# Patient Record
Sex: Female | Born: 1965 | Race: White | Hispanic: No | Marital: Married | Smoking: Former smoker
Health system: Southern US, Community
[De-identification: ages and names within clinical notes are randomized; demographics above are authoritative.]

## PROBLEM LIST (undated history)

## (undated) DIAGNOSIS — F329 Major depressive disorder, single episode, unspecified: Secondary | ICD-10-CM

## (undated) DIAGNOSIS — T7840XA Allergy, unspecified, initial encounter: Secondary | ICD-10-CM

## (undated) DIAGNOSIS — K219 Gastro-esophageal reflux disease without esophagitis: Secondary | ICD-10-CM

## (undated) DIAGNOSIS — R51 Headache: Secondary | ICD-10-CM

## (undated) DIAGNOSIS — F32A Depression, unspecified: Secondary | ICD-10-CM

## (undated) DIAGNOSIS — I10 Essential (primary) hypertension: Secondary | ICD-10-CM

## (undated) DIAGNOSIS — R7303 Prediabetes: Secondary | ICD-10-CM

## (undated) DIAGNOSIS — G4730 Sleep apnea, unspecified: Secondary | ICD-10-CM

## (undated) DIAGNOSIS — M1990 Unspecified osteoarthritis, unspecified site: Secondary | ICD-10-CM

## (undated) DIAGNOSIS — F419 Anxiety disorder, unspecified: Secondary | ICD-10-CM

## (undated) DIAGNOSIS — J45909 Unspecified asthma, uncomplicated: Secondary | ICD-10-CM

## (undated) DIAGNOSIS — R519 Headache, unspecified: Secondary | ICD-10-CM

## (undated) DIAGNOSIS — G709 Myoneural disorder, unspecified: Secondary | ICD-10-CM

## (undated) HISTORY — DX: Depression, unspecified: F32.A

## (undated) HISTORY — DX: Allergy, unspecified, initial encounter: T78.40XA

## (undated) HISTORY — DX: Sleep apnea, unspecified: G47.30

## (undated) HISTORY — DX: Major depressive disorder, single episode, unspecified: F32.9

## (undated) HISTORY — DX: Unspecified asthma, uncomplicated: J45.909

## (undated) HISTORY — DX: Myoneural disorder, unspecified: G70.9

## (undated) HISTORY — DX: Gastro-esophageal reflux disease without esophagitis: K21.9

---
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
## 2005-02-01 ENCOUNTER — Ambulatory Visit: Payer: Self-pay

## 2005-02-01 IMAGING — US US PELV - US TRANSVAGINAL
1 series · 17 of 25 positions shown · non-contrast
Comparison: none

REASON FOR EXAM: Follow-up fibroids
COMMENTS:

[Series 1: us pelv - us transvaginal · 17 of 43 slices shown]
[im 1/43]
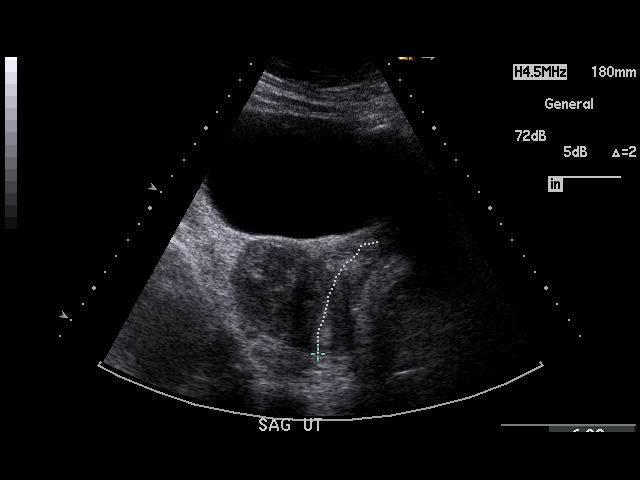
[im 4/43]
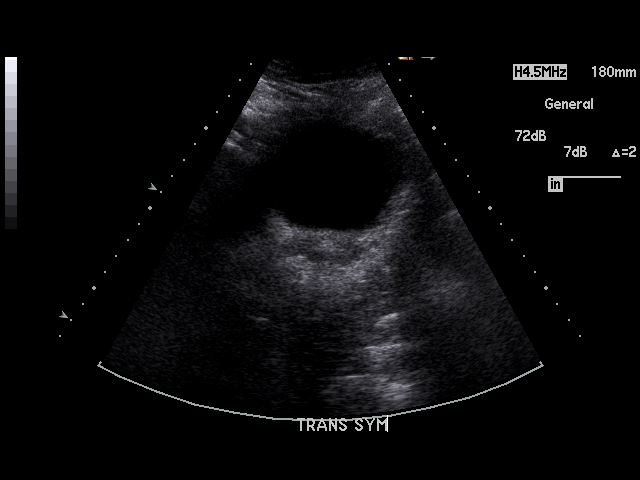
[im 6/43]
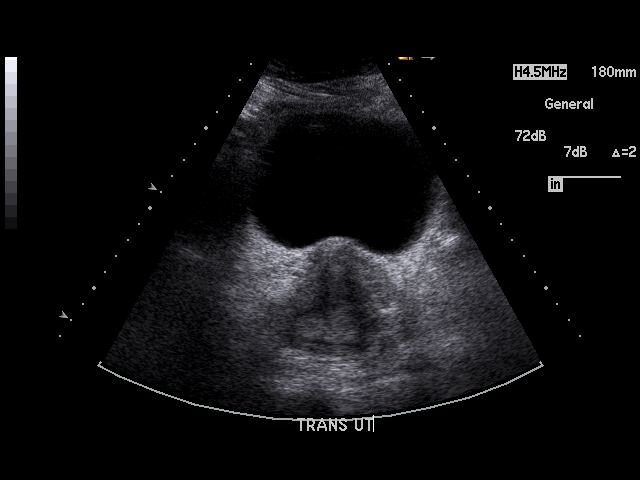
[im 9/43]
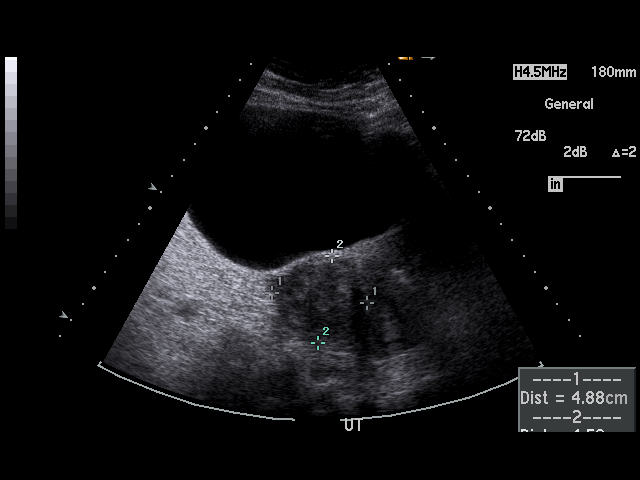
[im 11/43]
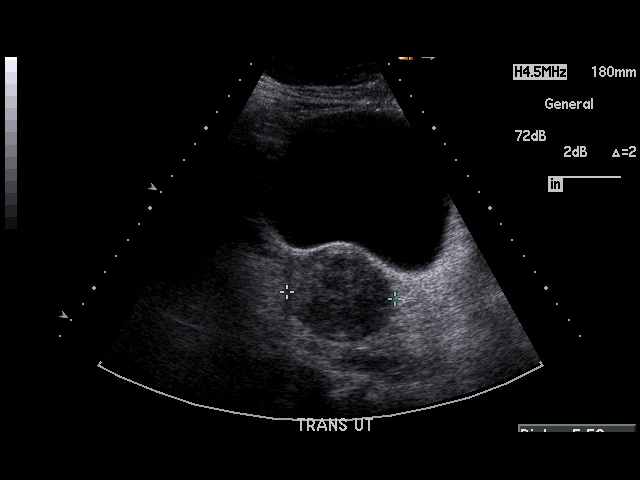
[im 15/43]
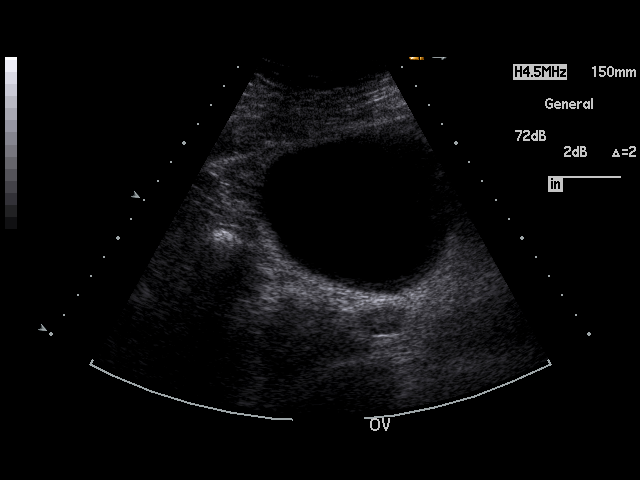
[im 16/43]
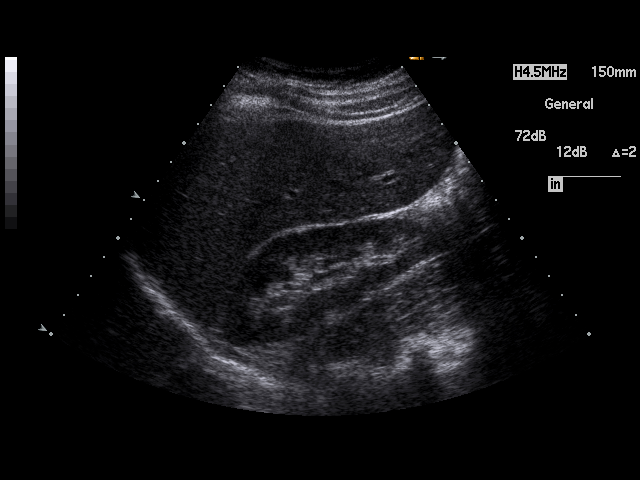
[im 20/43]
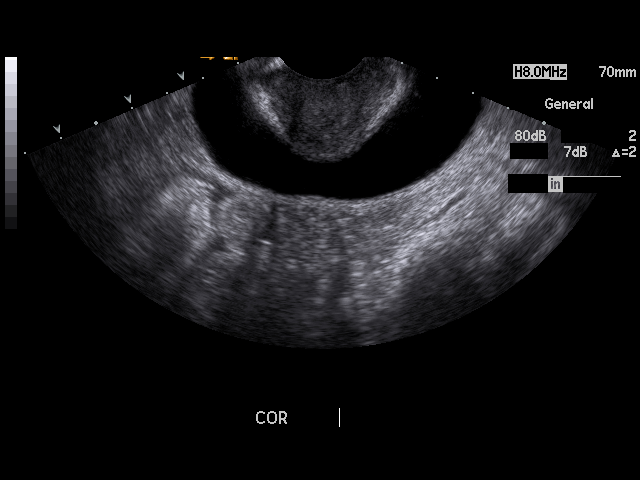
[im 22/43]
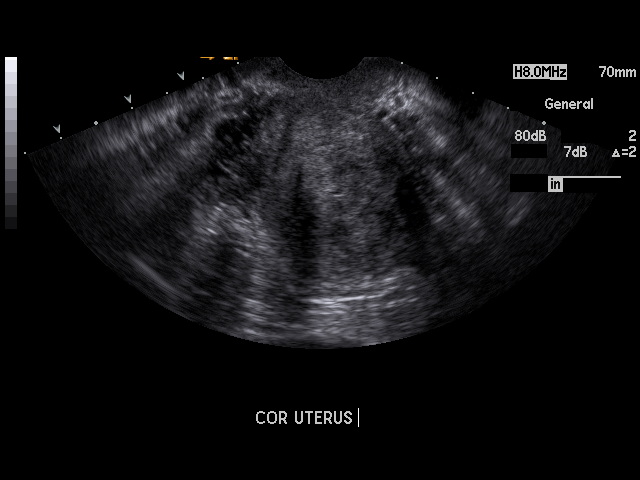
[im 23/43]
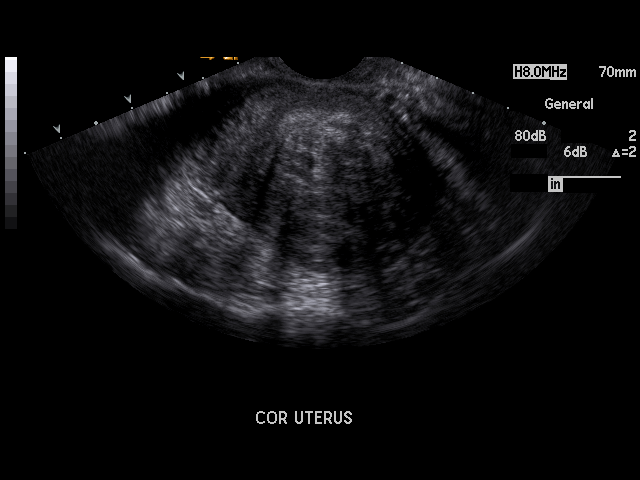
[im 27/43]
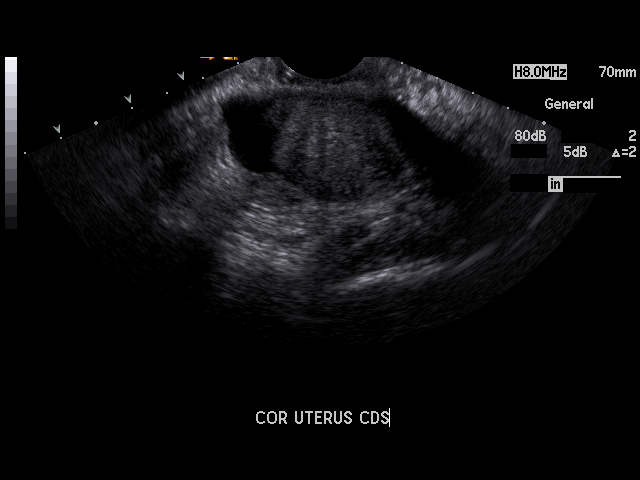
[im 29/43]
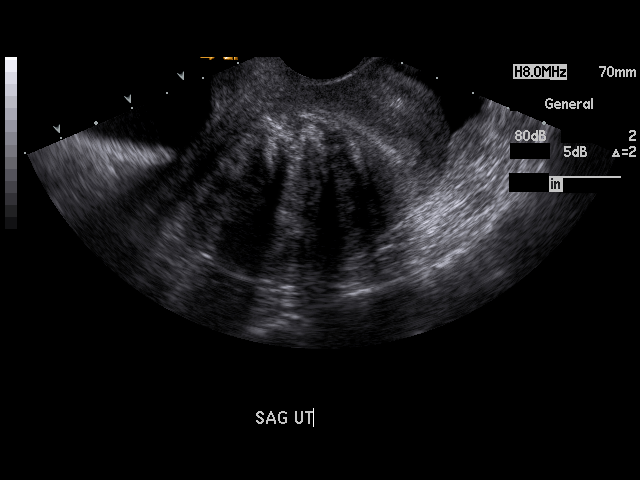
[im 32/43]
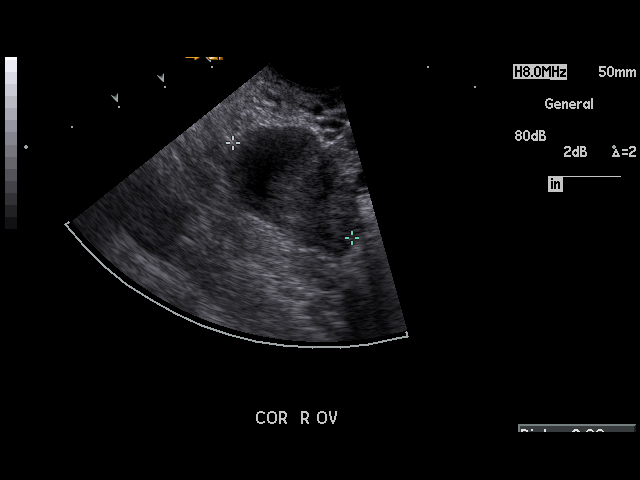
[im 34/43]
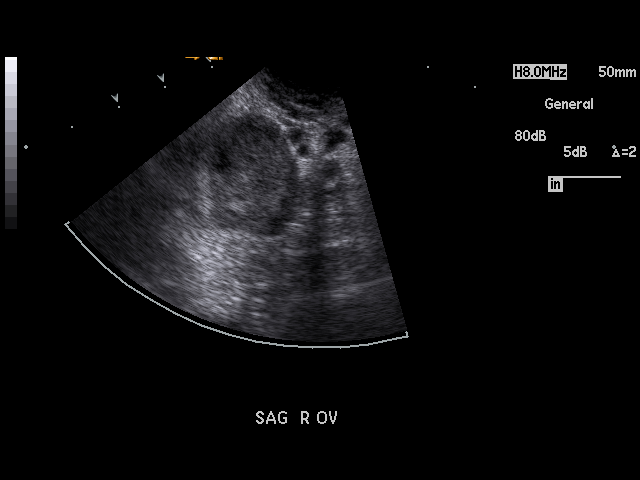
[im 37/43]
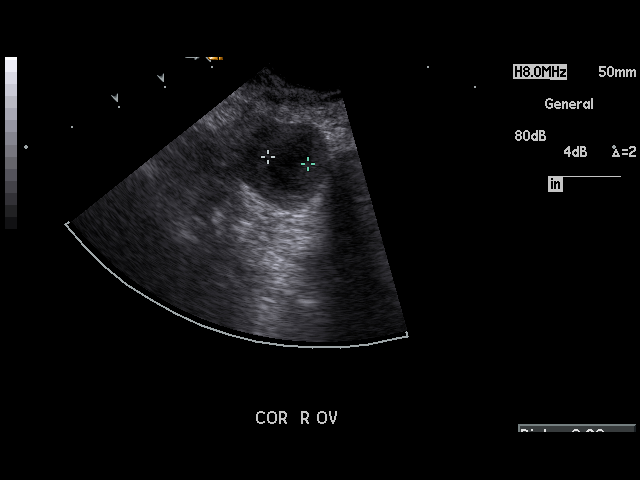
[im 39/43]
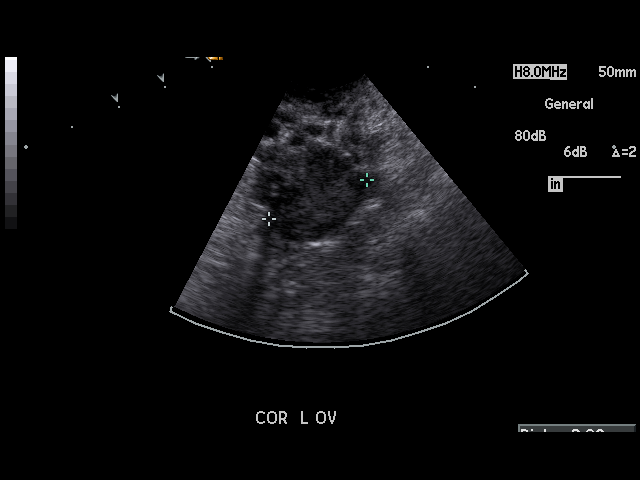
[im 43/43]
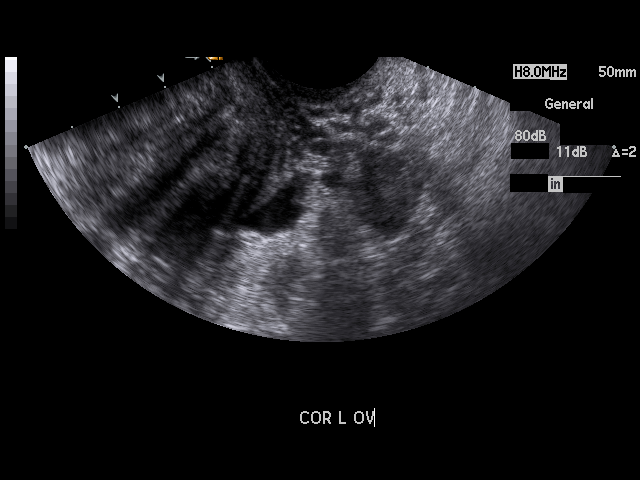

[17 of 25 positions shown; findings below may reference images not displayed]

PROCEDURE:     US  - US PELVIS MASS EXAM  - [DATE] [DATE] [DATE]  [DATE]

RESULT:     The uterus measures 6.99 cm x 6.92 cm x 5.36 cm.  Within the
uterus there is noted a hypoechoic mass which measures 5.44 cm x 4.41 cm x
4.78 cm and is consistent with the uterine fibroid.  No other uterine masses
are seen.  The endometrium measures 1.01 cm in thickness.  The ovaries are
visualized bilaterally.  The RIGHT ovary measures 2.42 cm at maximum
diameter and the LEFT ovary measures 2.08 cm at maximum diameter.  There is
a 1.12 cm cyst of the RIGHT ovary.  There is some echogenicity within the
ovary which suggest this may represent a small hemorrhagic cyst.  A small
amount of free fluid is seen in the pelvis.
IMPRESSION: There is a 5.44 cm uterine mass consistent with a fibroid as noted above.

Trace ascites.

There is a small cyst of the RIGHT ovary. The cyst contains echoes and may
represent a small hemorrhagic cyst.

## 2005-06-06 ENCOUNTER — Ambulatory Visit: Payer: Self-pay | Admitting: Pain Medicine

## 2005-06-06 IMAGING — CR DG LUMBAR SPINE AP/LAT/OBLIQUES W/ FLEX AND EXT
1 series · 5 of 5 positions shown · non-contrast
Comparison: none

REASON FOR EXAM: back and neck pain (telephone results to physician)
COMMENTS:

PROCEDURE:     DXR - DXR LUMBAR SPINE WITH OBLIQUES  - [DATE] [DATE]
RESULT:     AP, lateral, and oblique views of the lumbar spine show the
vertebral body heights and intervertebral disc spaces to be well maintained.
 The vertebral body alignment is normal.  The pedicles are bilaterally
intact.

[Series 1: view not recorded · 0.17mm/px · 5 of 5 slices shown]
[im 1/5]
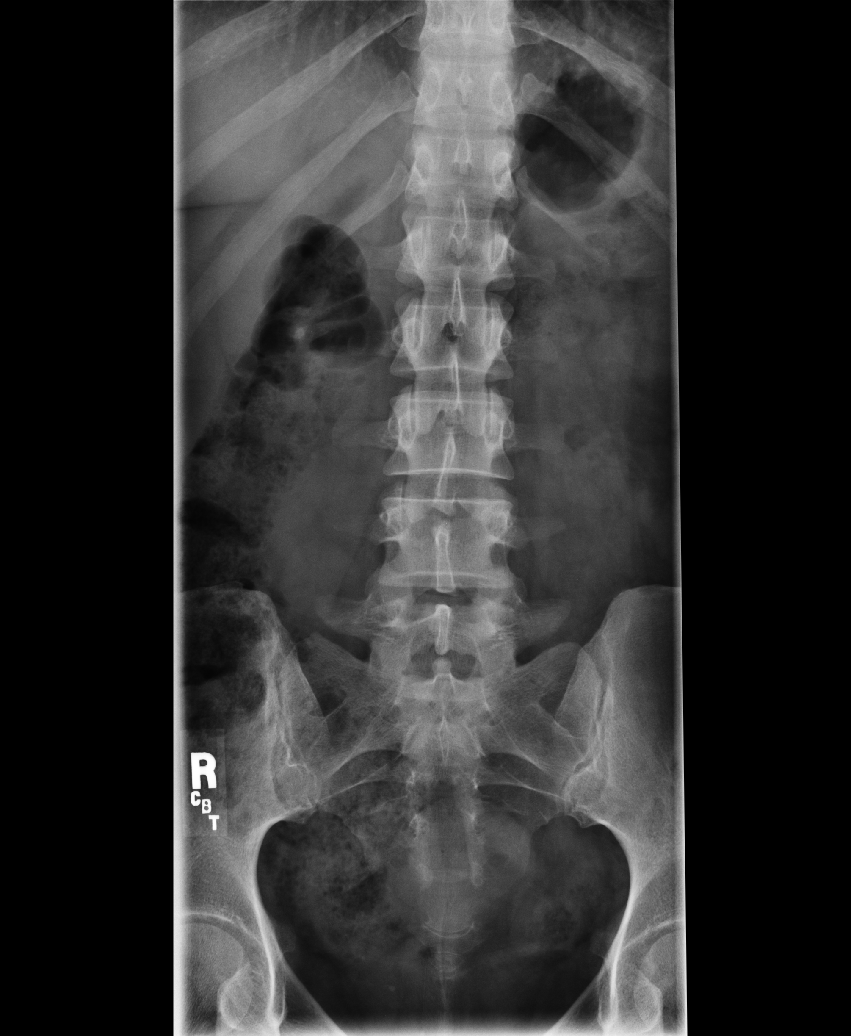
[im 2/5]
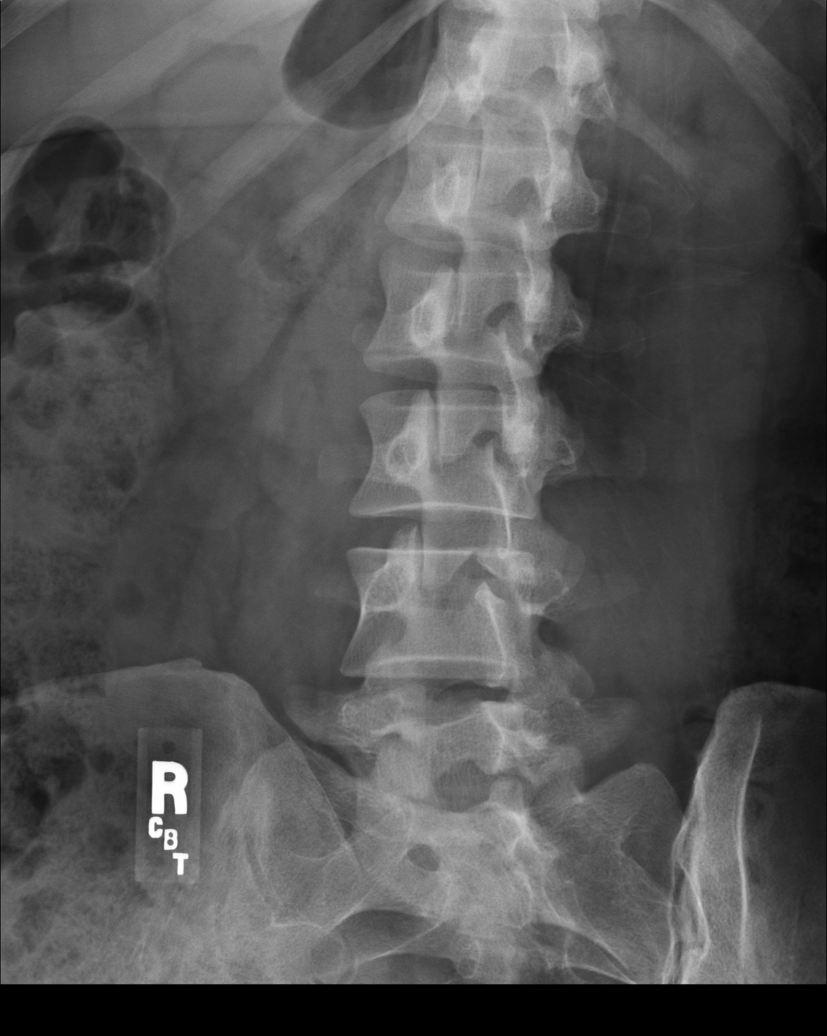
[im 3/5]
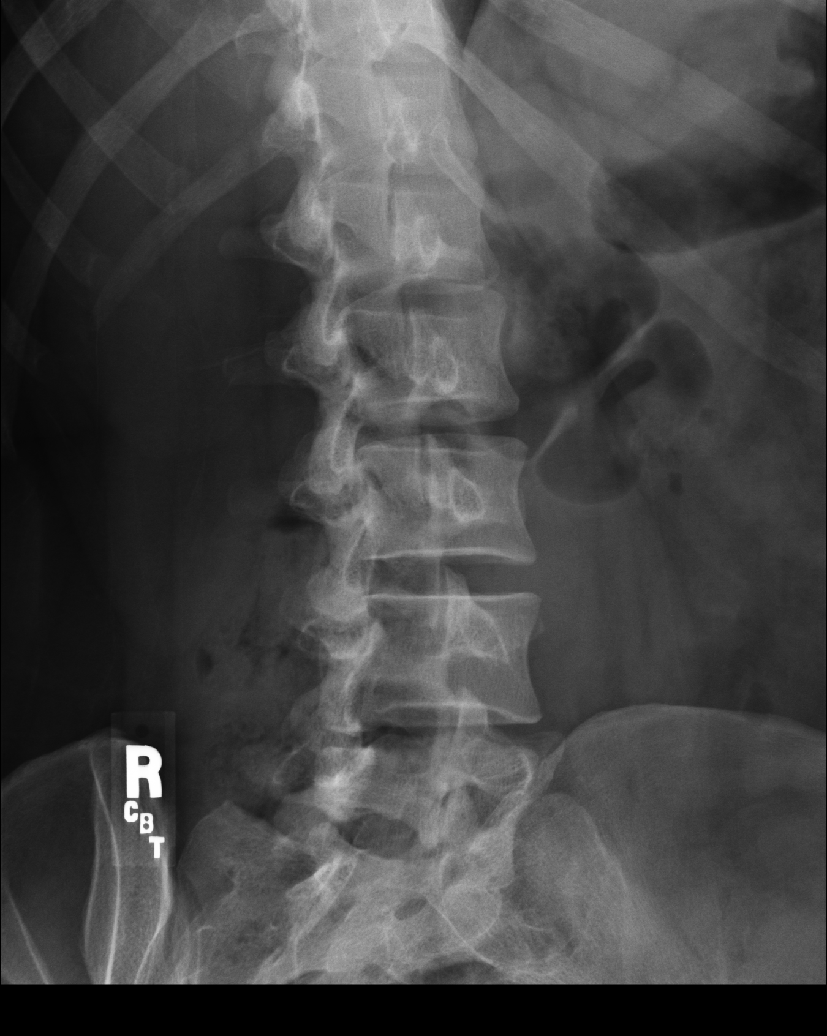
[im 4/5]
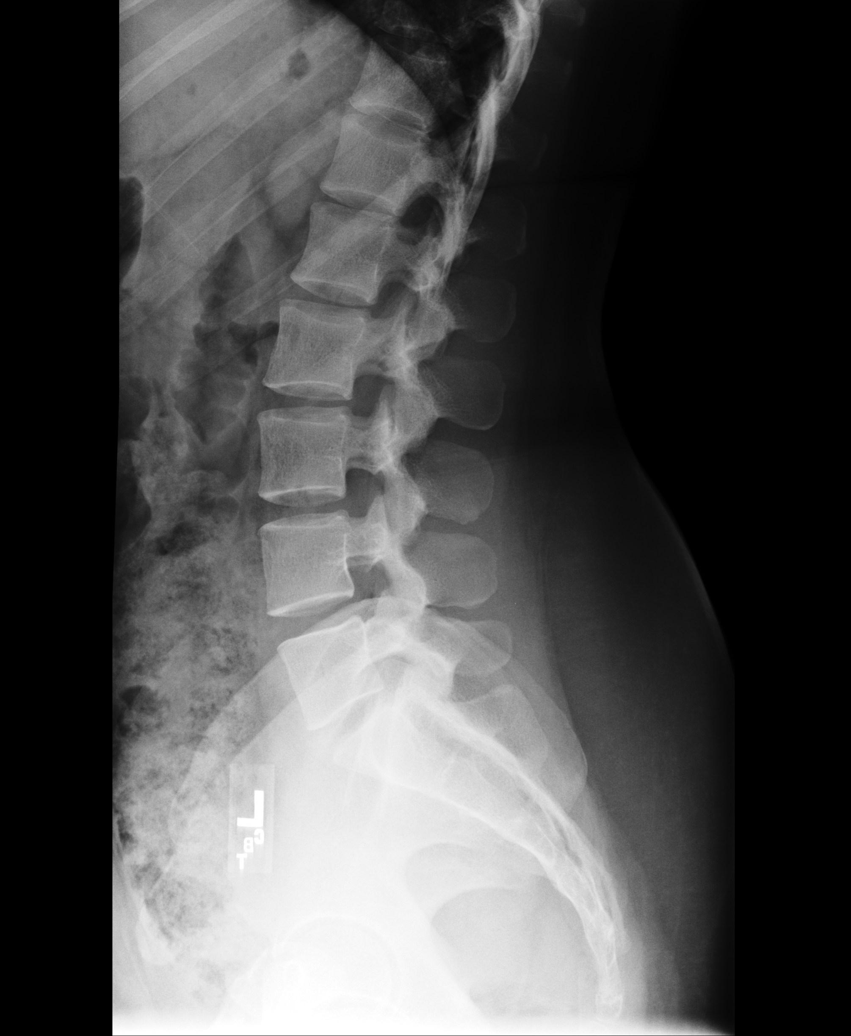
[im 5/5]
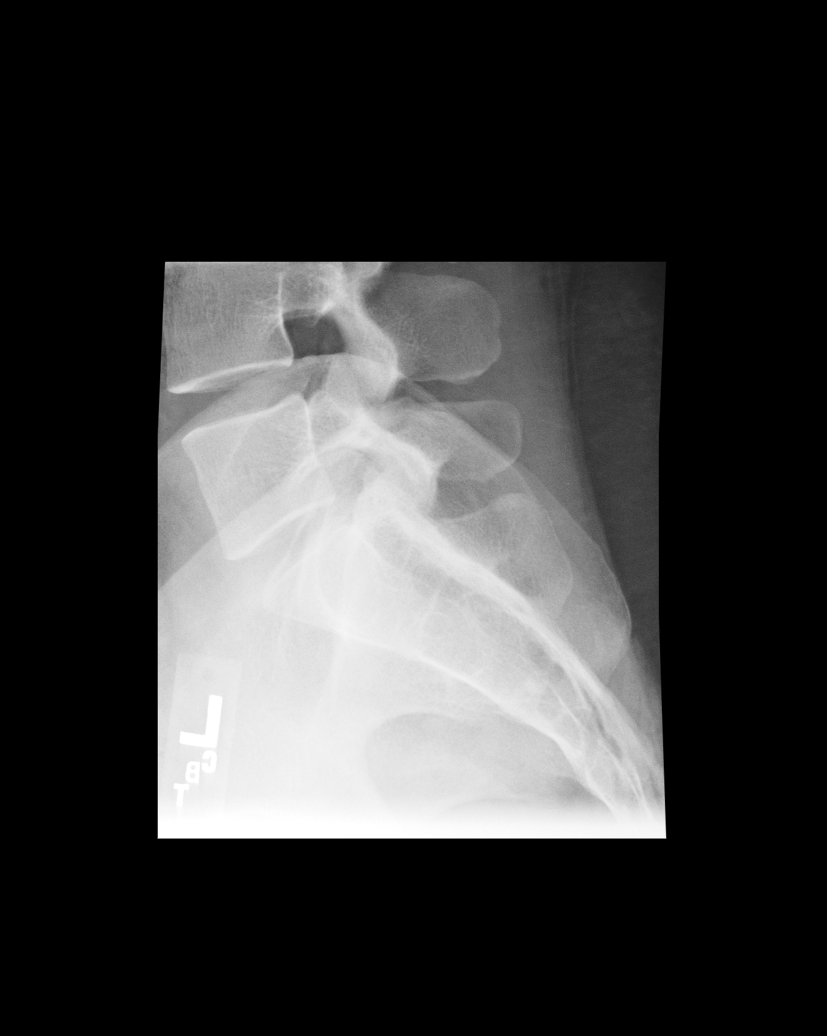

[5 of 5 positions shown; findings below may reference images not displayed]

IMPRESSION: No significant abnormalities are noted.

## 2005-06-06 IMAGING — CR CERVICAL SPINE - COMPLETE 4+ VIEW
1 series · 6 of 6 positions shown · non-contrast
Comparison: none

REASON FOR EXAM: back and neck pain (telephone results to physician)
COMMENTS:

[Series 1: view not recorded · 0.17mm/px · 6 of 6 slices shown]
[im 1/6]
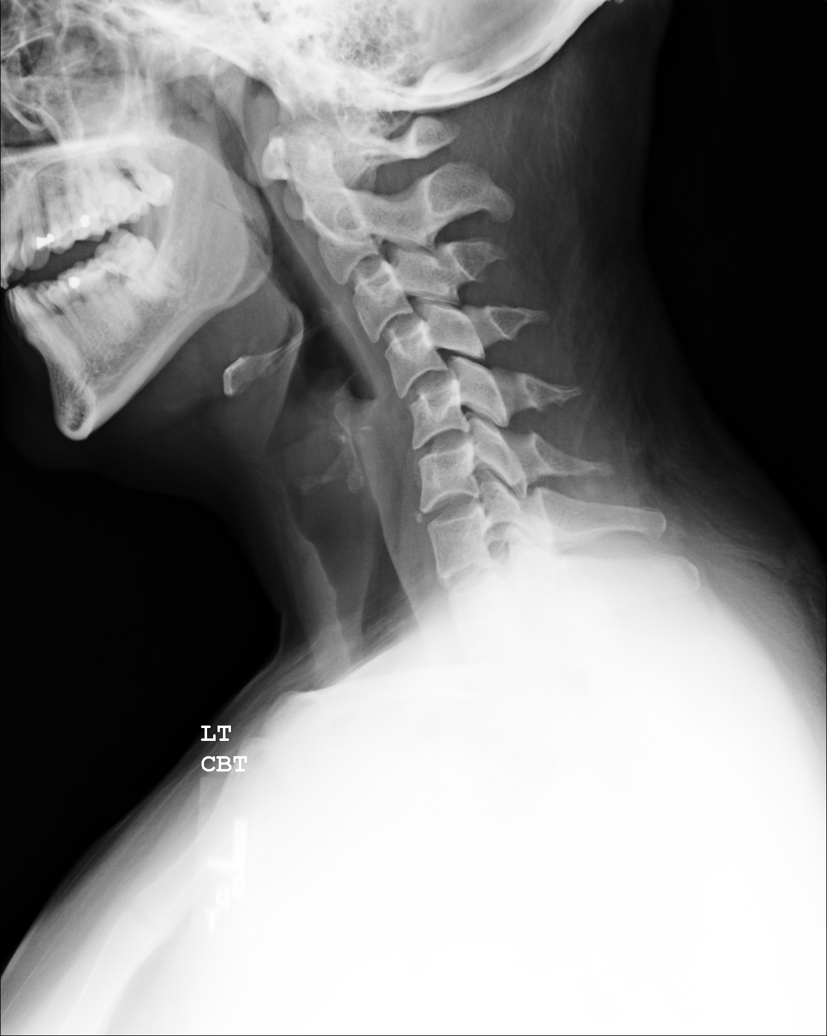
[im 2/6]
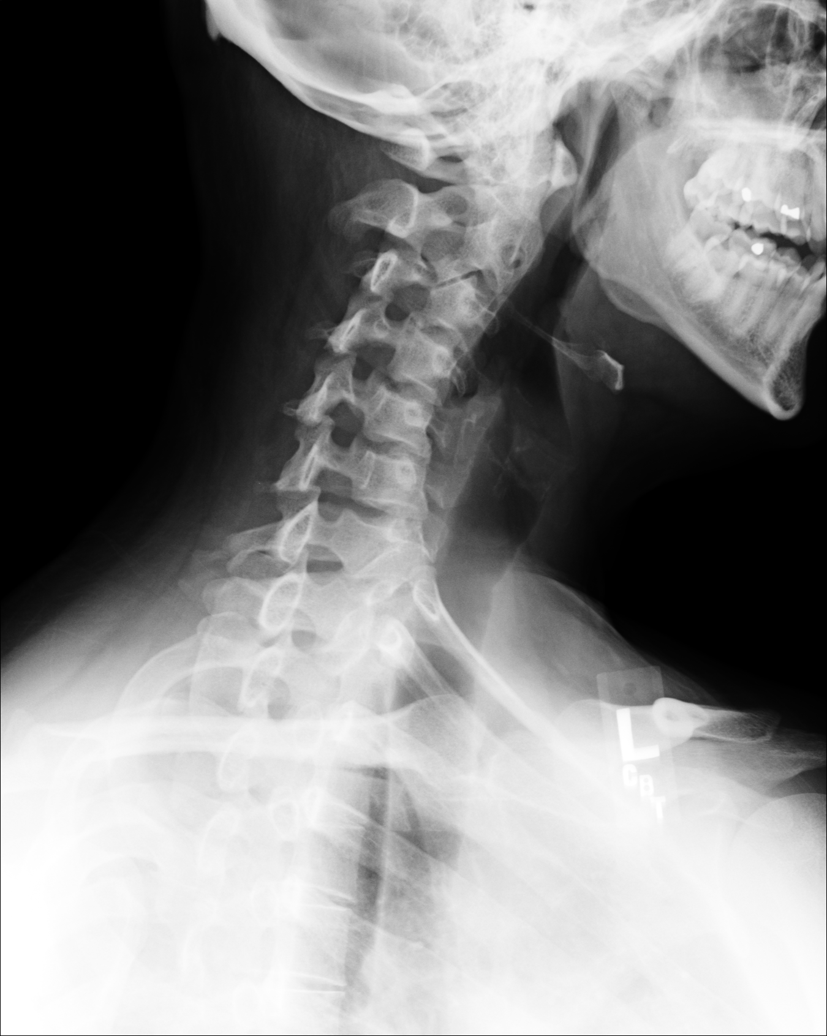
[im 3/6]
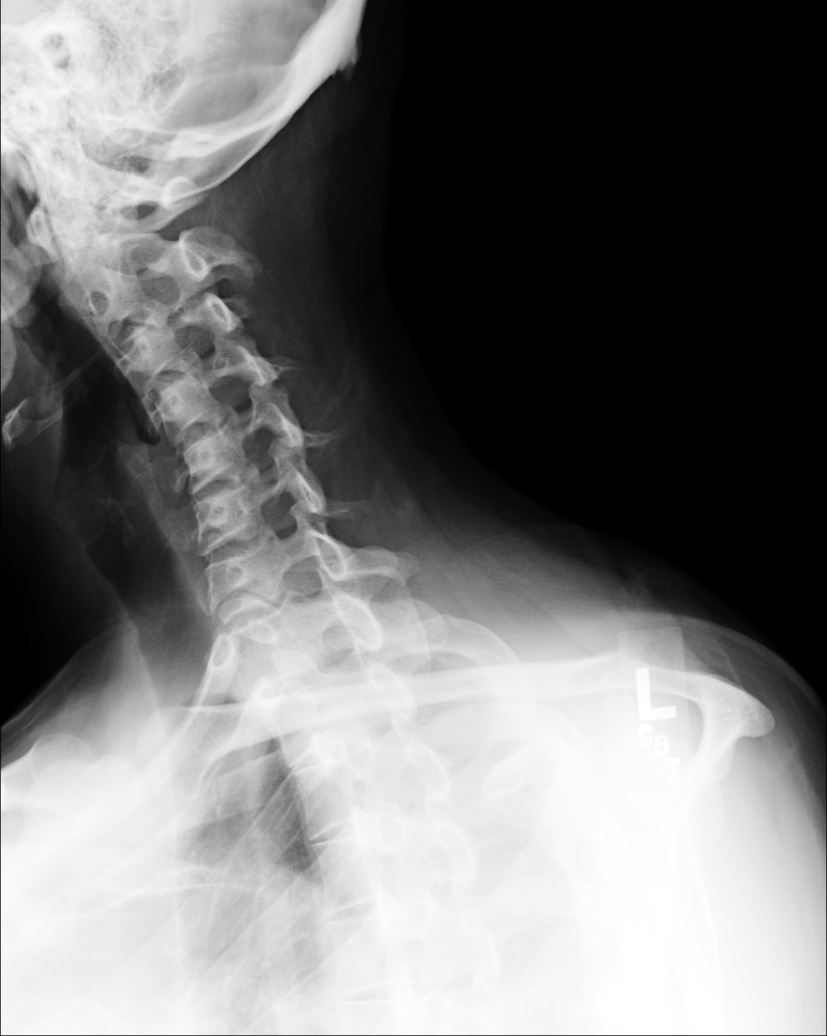
[im 4/6]
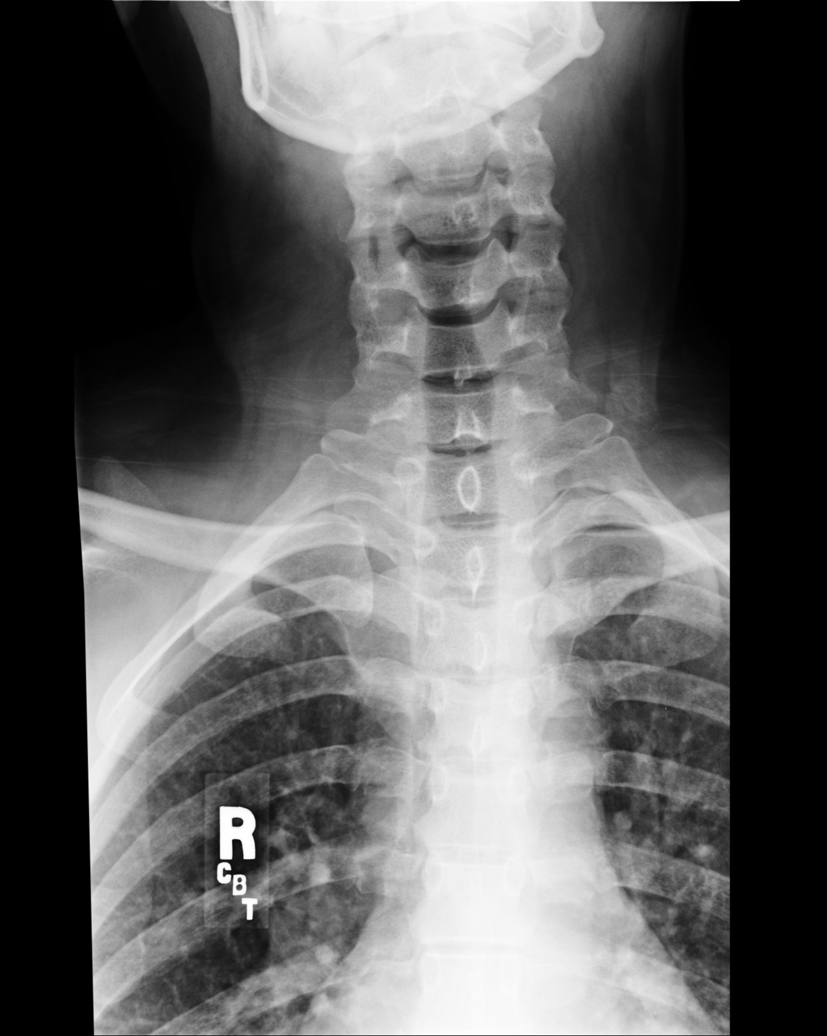
[im 5/6]
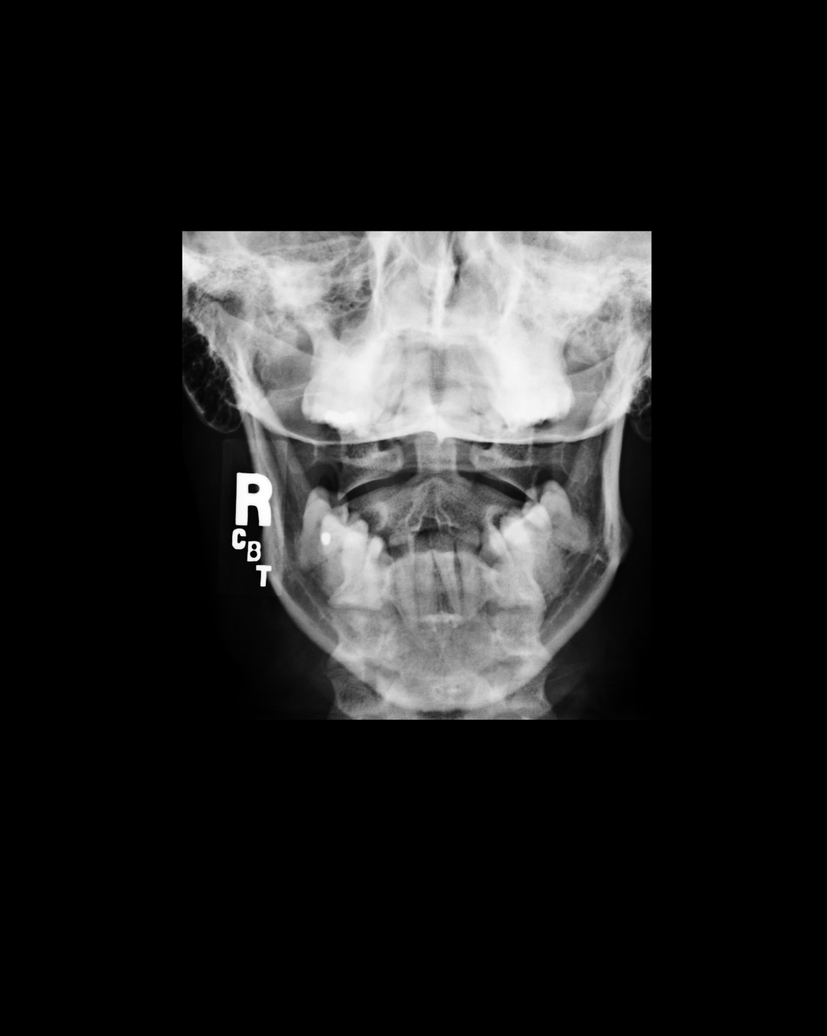
[im 6/6]
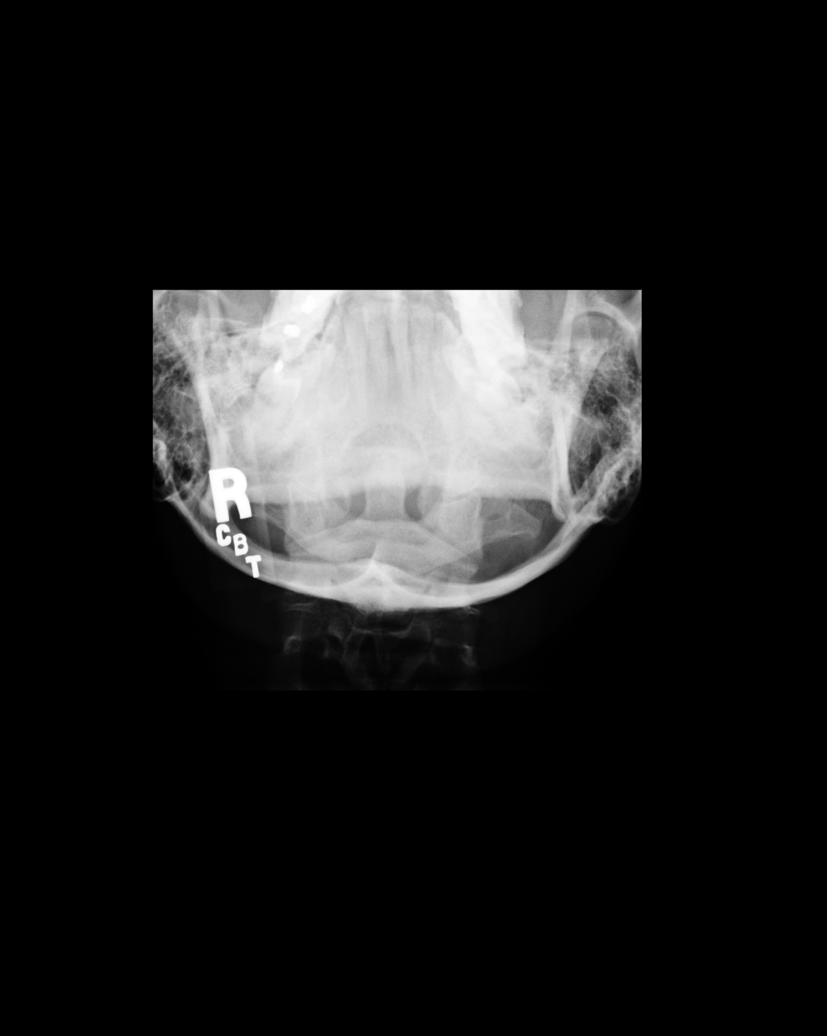

[6 of 6 positions shown; findings below may reference images not displayed]

PROCEDURE:     DXR - DXR CERVICAL SPINE COMPLETE  - [DATE] [DATE]

RESULT:     The vertebral body heights and intervertebral disc spaces are
well maintained.  Vertebral body alignment is normal.  There is noted
reversal of the usual lordotic curvature which suggests cervical muscle
spasm.  Oblique views show the neural foramen to be widely patent
bilaterally.  The articular facets are normal in appearance.  The odontoid
process is intact.
IMPRESSION: 1.     No fracture is seen.
2.     No cervical rib formation is observed.
3.     There is reversal of the usual lordotic curvature which suggests
cervical muscle spasm.

## 2006-08-11 DIAGNOSIS — G4700 Insomnia, unspecified: Secondary | ICD-10-CM | POA: Insufficient documentation

## 2006-09-05 DIAGNOSIS — F419 Anxiety disorder, unspecified: Secondary | ICD-10-CM | POA: Insufficient documentation

## 2010-08-19 ENCOUNTER — Ambulatory Visit: Payer: Self-pay | Admitting: Family Medicine

## 2010-08-19 IMAGING — MG MM CAD DIAGNOSTIC MAMMO
1 series · 8 of 8 positions shown · non-contrast
Comparison: none

REASON FOR EXAM: R br pain under nipple    baseline
COMMENTS:

[R CC · right · 8 of 10 slices shown]
[im 1/10]
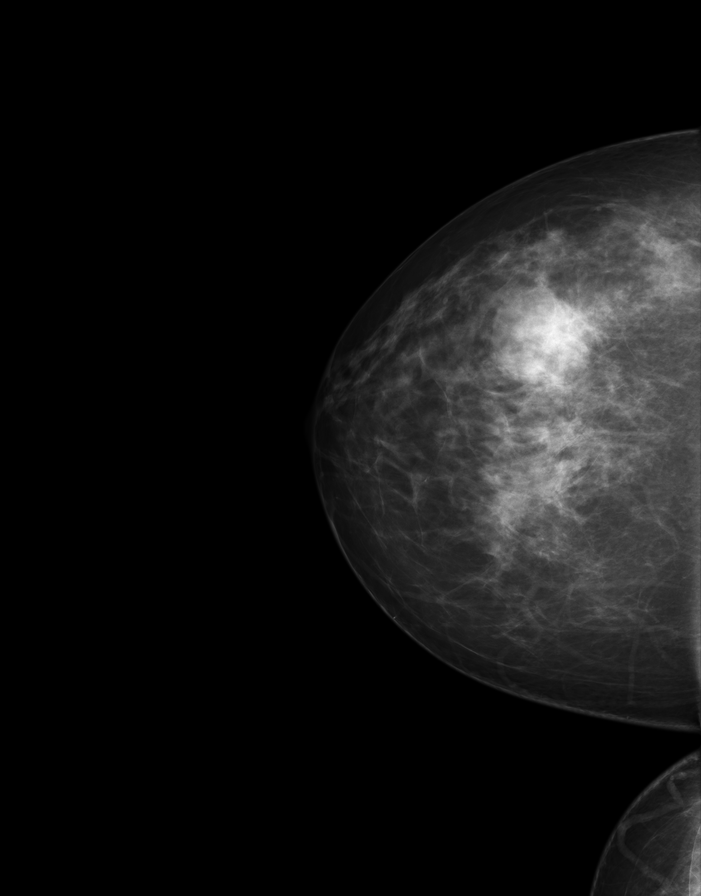
[im 2/10]
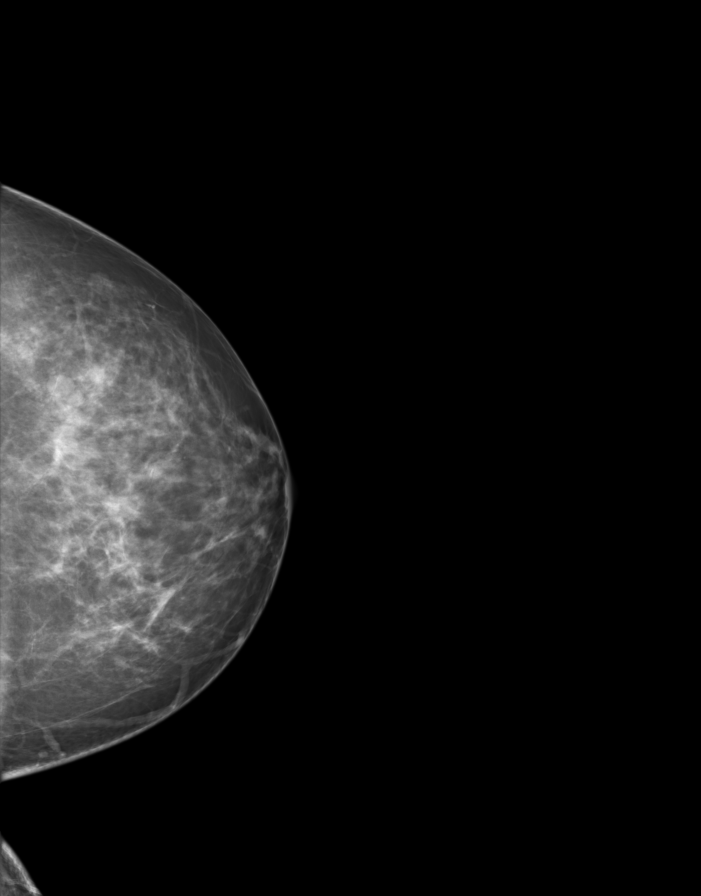
[im 3/10]
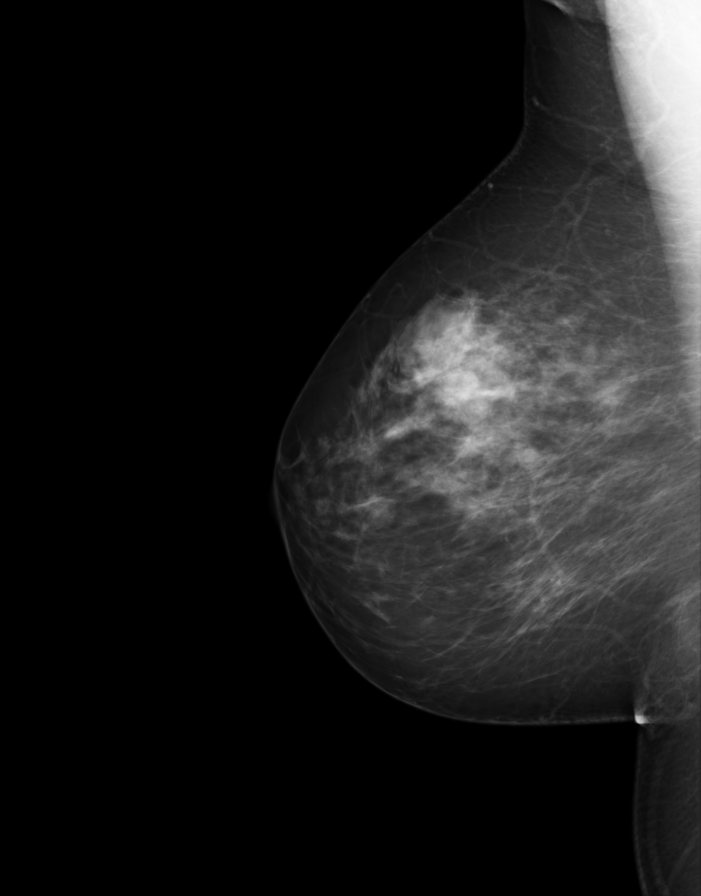
[im 4/10]
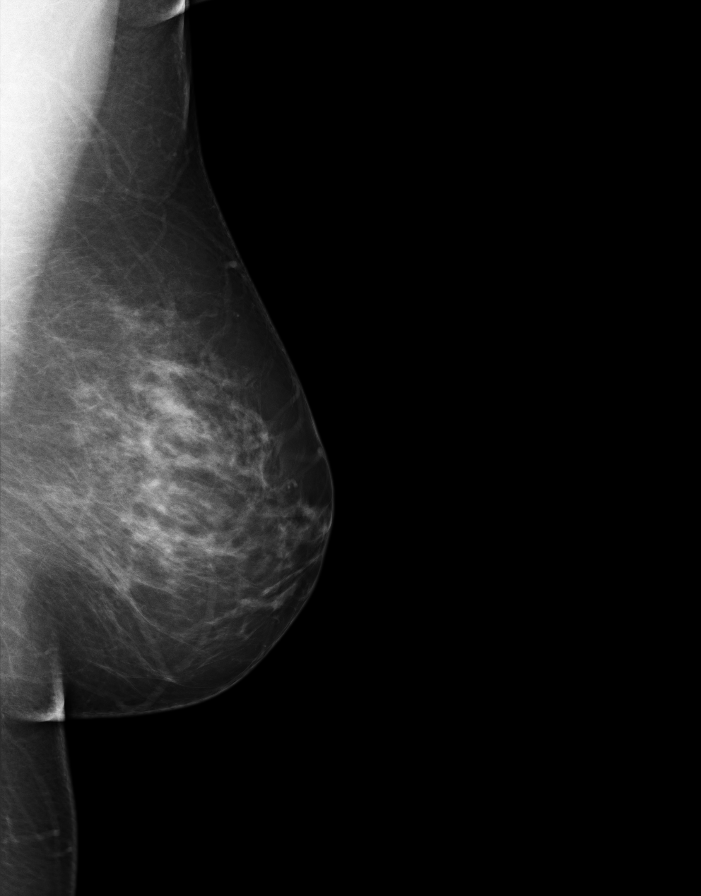
[im 6/10]
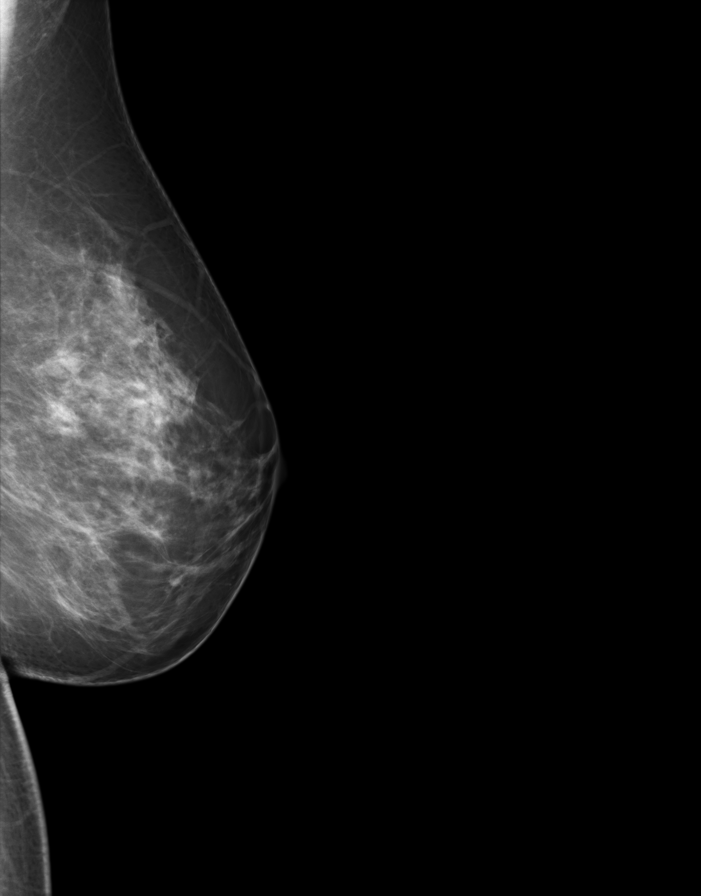
[im 7/10]
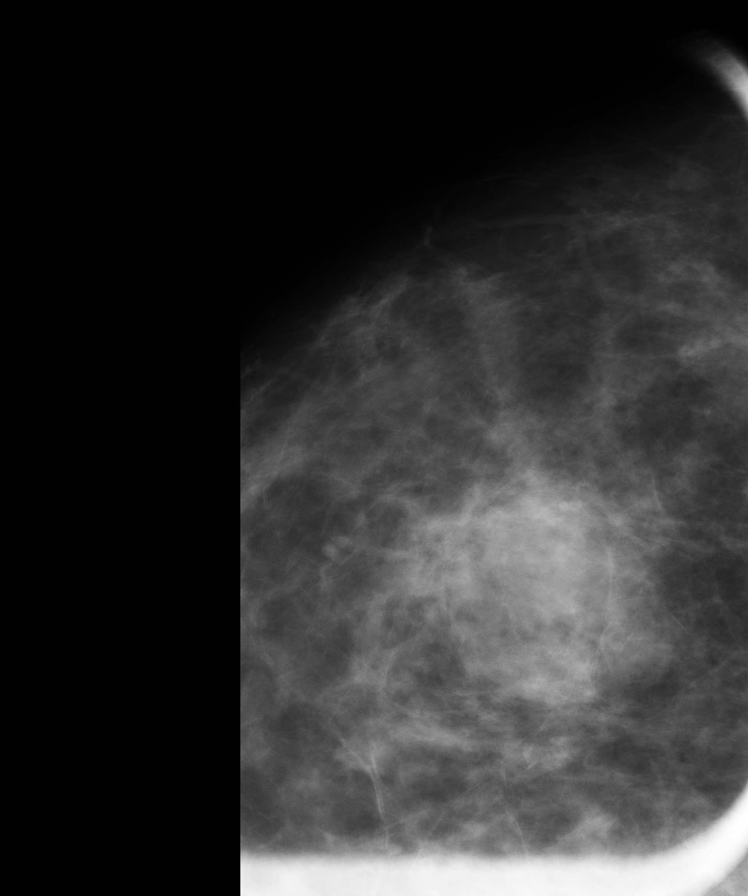
[im 8/10]
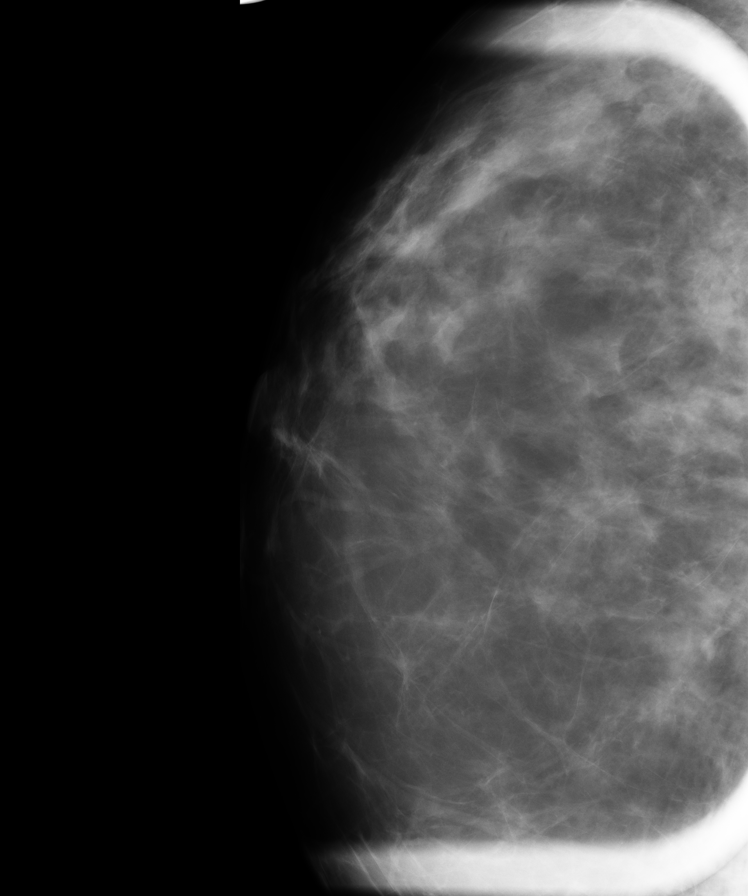
[im 10/10]
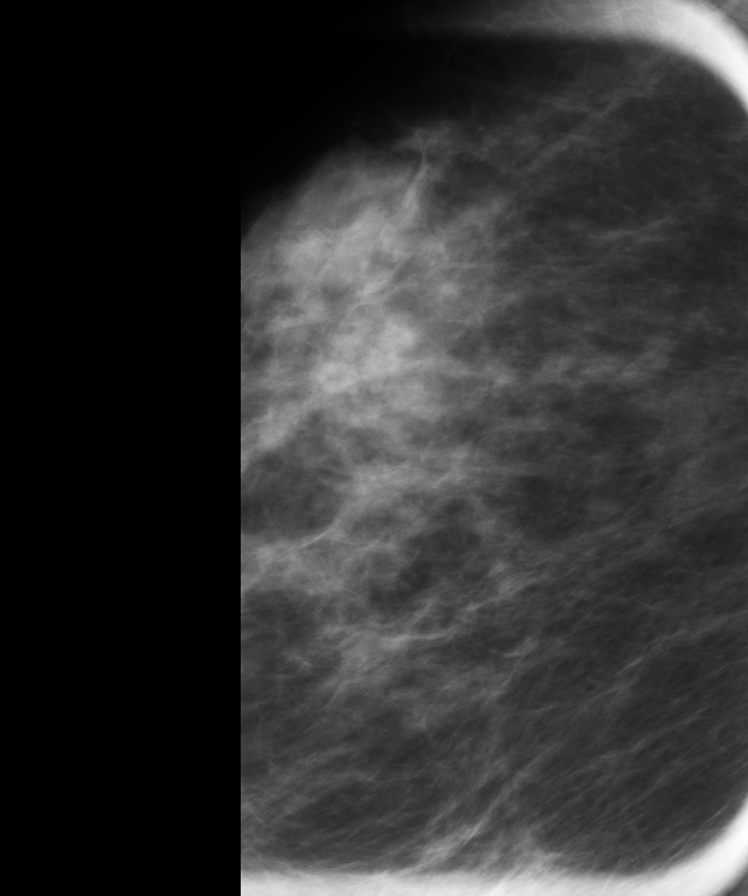

[8 of 8 positions shown; findings below may reference images not displayed]

PROCEDURE:     MAM - MAM DGTL DIAGNOSTIC MAMMO W/CAD  - [DATE]  [DATE]

RESULT:     The patient is reporting discomfort deep to the right nipple.
This is a baseline study.

The breasts exhibit a moderately dense parenchymal pattern. The right breast
is larger than the left.  Somewhat more dense parenchymal tissue is present
on the right in the upper outer quadrant. Spot compression views reveal no
discrete abnormal mass here.  There are no malignant appearing groupings of
microcalcification. I do not see retroareolar abnormalities. Ultrasound was
performed from the 4 o'clock and 7 o'clock positions which were the areas of
symptoms. No discrete cystic or solid masses were demonstrated. At the 9
o'clock position there is an approximately 5 mm diameter cystic structure
demonstrated.  At the 11 o'clock position an area of decreased echogenicity
without significant distal shadowing is seen. It measures approximately
x 2.9 x 1.2 cm.  This likely corresponds to the area of parenchymal density
seen mammographically.
IMPRESSION: 1.I see no abnormality of the left breast.

2.The right breast is larger than the left and it is somewhat more dense in
the upper outer quadrant.  This area corresponds to a reasonably well
circumscribed, complex appearing hypoechoic focus without distal shadowing
at the 10 o'clock to 11 o'clock position.  This likely reflects an entity
such as a fibroadenoma but without any prior studies this cannot be stated
with confidence. I feel it would be prudent for the patient to be considered
for surgical consultation with an eye toward possible biopsy versus close
clinical imaging follow-up.

BI-RADS: Category 4 -A Suspicious Abnormality

The finding is felt to have a low suspicion for malignancy and surgical
consultation is recommended.

A NEGATIVE MAMMOGRAM REPORT DOES NOT PRECLUDE BIOPSY OR OTHER EVALUATION OF
A CLINICALLY PALPABLE OR OTHERWISE SUSPICIOUS MASS OR LESION. BREAST CANCER
MAY NOT BE DETECTED BY MAMMOGRAPHY IN UP TO 10% OF CASES.

## 2010-08-19 IMAGING — US ULTRASOUND RIGHT BREAST
1 series · 17 of 25 positions shown · non-contrast
Comparison: none

REASON FOR EXAM: R br pain under nipple    baseline
COMMENTS:

[Series 1: ultrasound right breast · 17 of 58 slices shown]
[im 1/58]
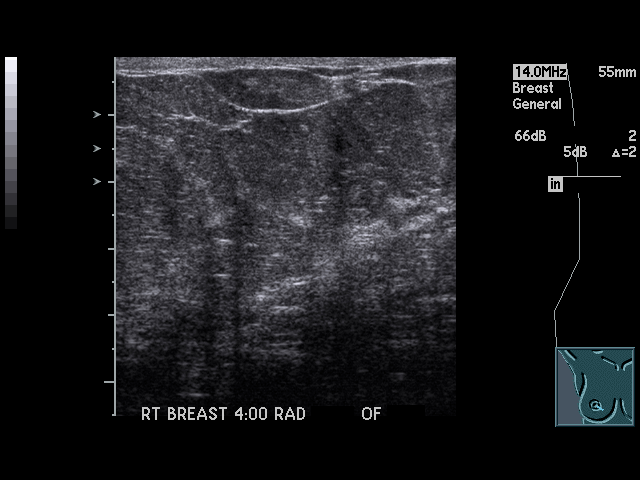
[im 5/58]
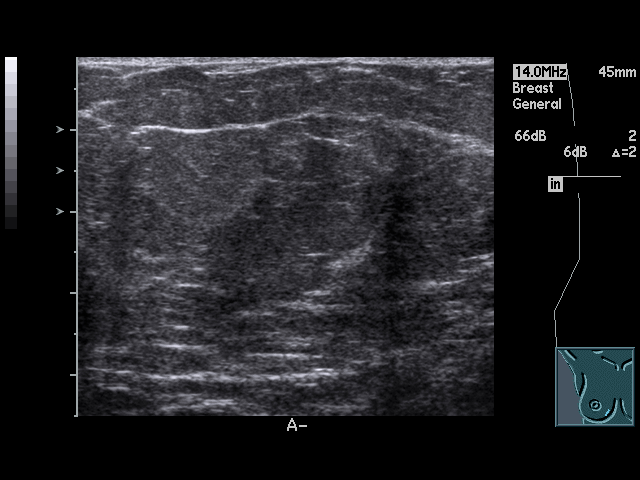
[im 8/58]
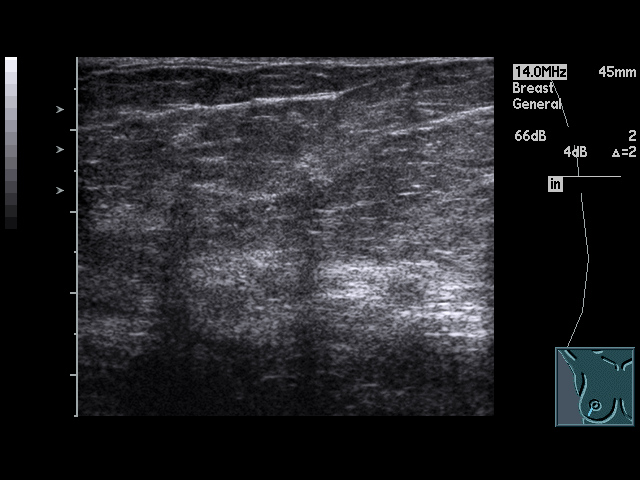
[im 12/58]
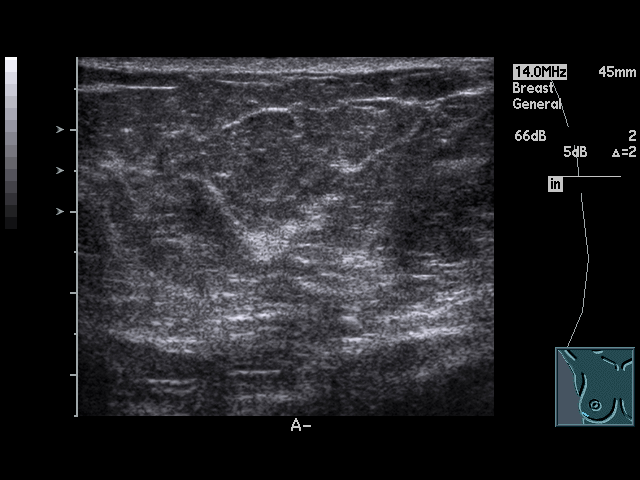
[im 15/58]
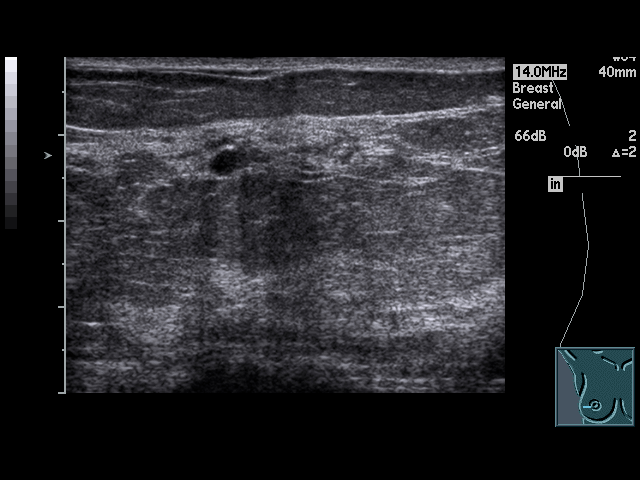
[im 20/58]
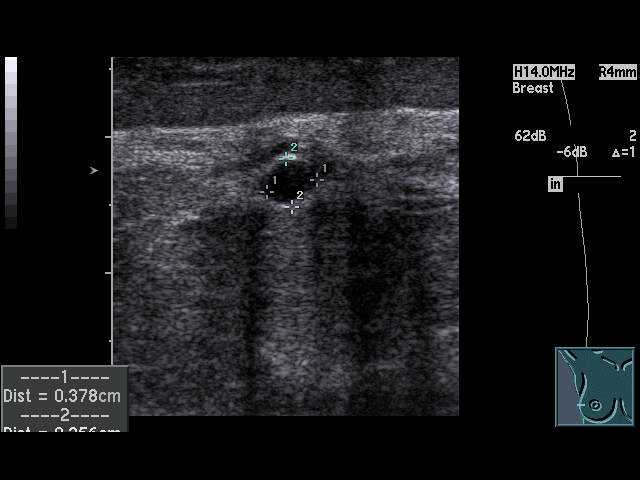
[im 22/58]
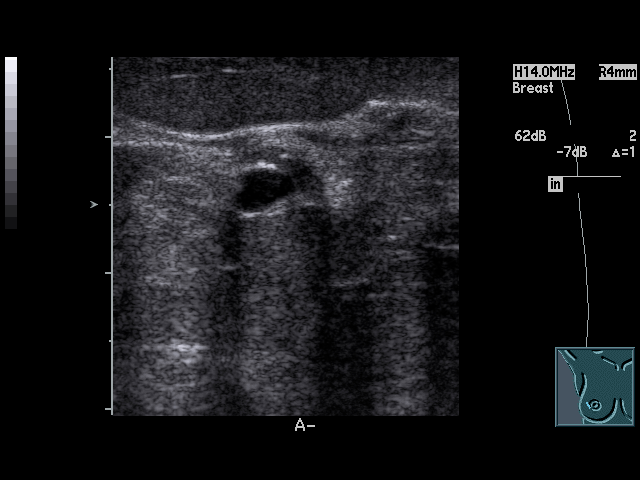
[im 27/58]
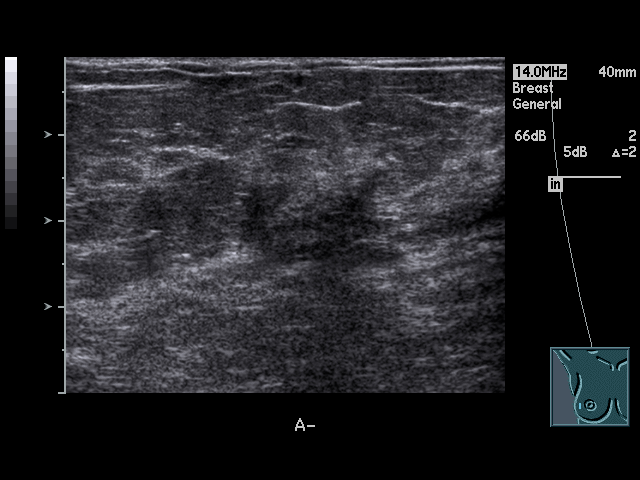
[im 29/58]
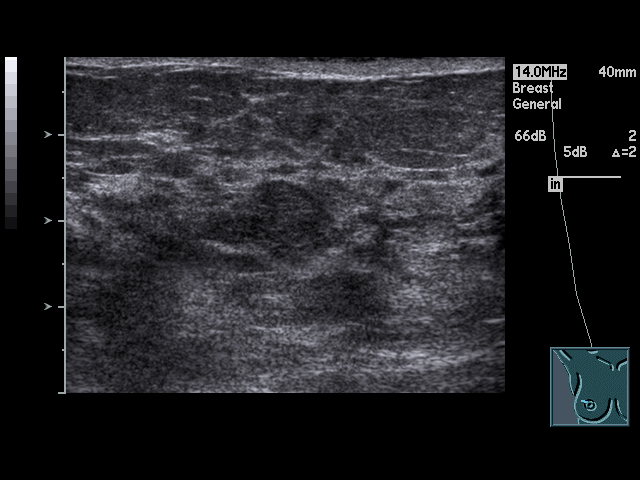
[im 31/58]
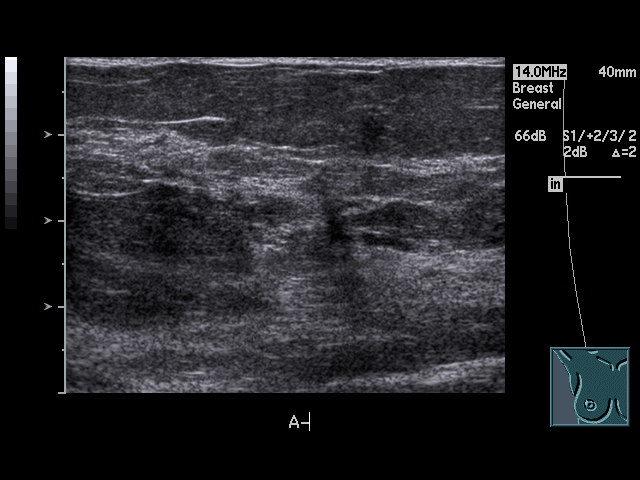
[im 36/58]
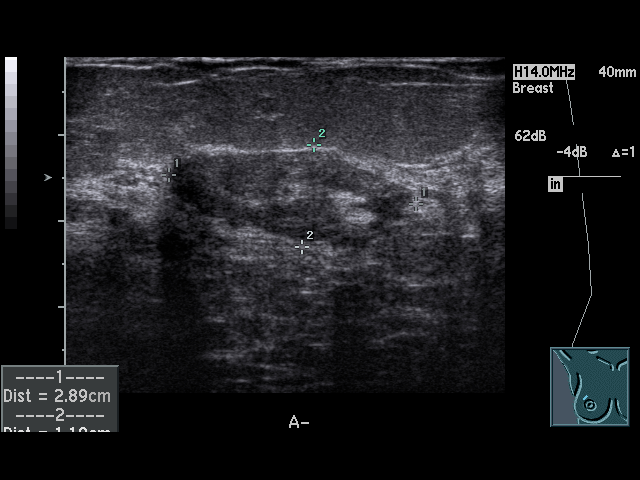
[im 39/58]
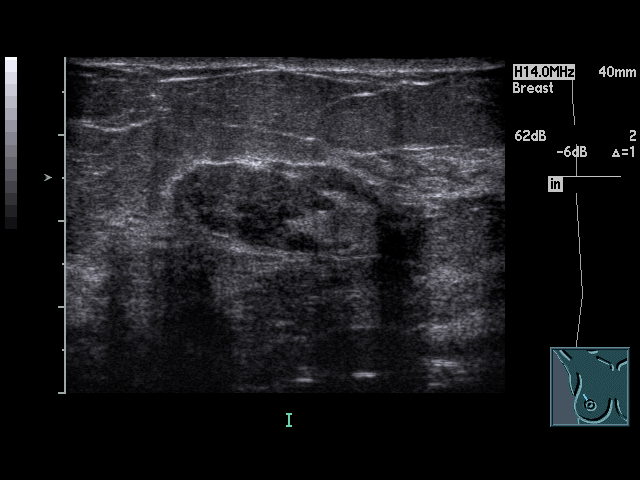
[im 43/58]
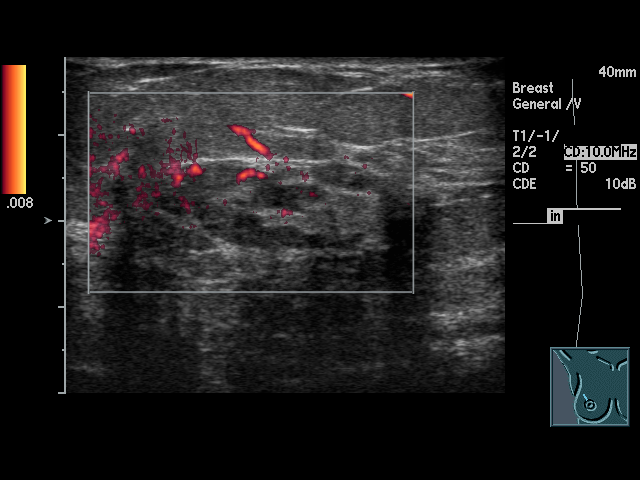
[im 46/58]
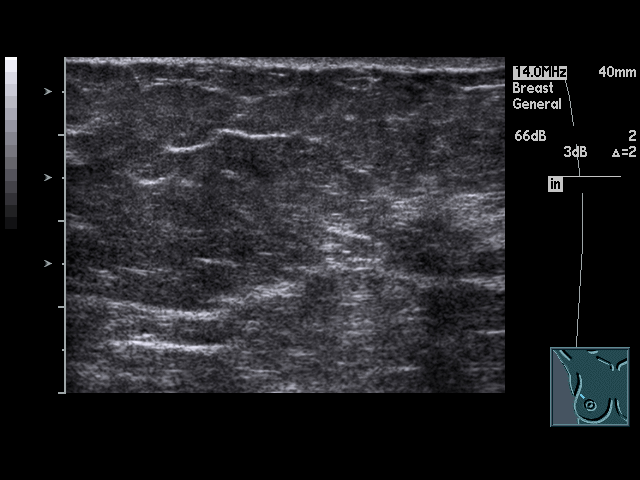
[im 50/58]
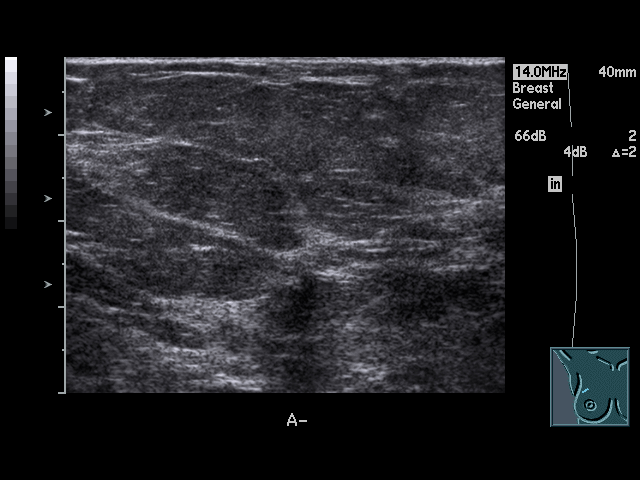
[im 53/58]
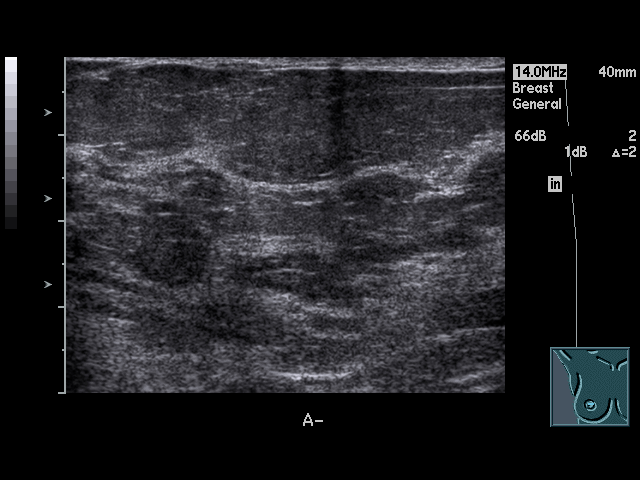
[im 58/58]
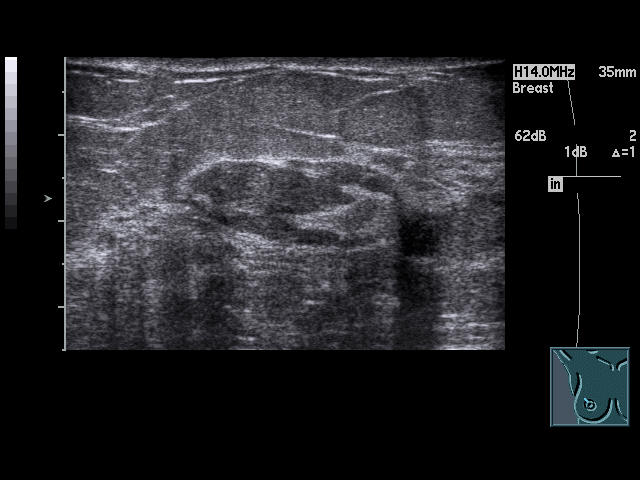

[17 of 25 positions shown; findings below may reference images not displayed]

PROCEDURE:     US  - US BREAST RIGHT  - [DATE]  [DATE]

RESULT:     The patient was reporting discomfort at the approximately 4
o'clock and 7 o'clock positions of the breast. Ultrasound was performed of
these regions. No discrete cystic or solid masses were demonstrated here.
However, at the 9 o'clock position there is a well circumscribed, cystic
appearing structure measuring approximately 5 mm in greatest dimension with
enhanced through transmission. At the 10 o'clock to 11 o'clock position
there is an ovoid, complex appearing hypoechoic nodule with some internal
vascular flow demonstrated. This is an indeterminate lesion. It does not
exhibit significant distal shadowing but it is not a simple cyst.
IMPRESSION: 1.There are findings at the  9 o'clock and at the 10 o'clock to 11 o'clock
positions of the right breast as described.

2. I see no abnormality at the 4 o'clock and 7 o'clock positions.

 Please see the dictation of the diagnostic mammogram of this same day for
final recommendations and BI-RADS classification.

## 2010-11-14 HISTORY — PX: BREAST BIOPSY: SHX20

## 2011-03-10 ENCOUNTER — Ambulatory Visit: Payer: Self-pay | Admitting: General Surgery

## 2013-06-25 ENCOUNTER — Ambulatory Visit: Payer: Self-pay | Admitting: Obstetrics and Gynecology

## 2014-09-09 ENCOUNTER — Ambulatory Visit: Payer: Self-pay | Admitting: Family Medicine

## 2014-09-09 IMAGING — CR CERVICAL SPINE - 2-3 VIEW
1 series · 4 of 4 positions shown · non-contrast
Comparison: [DATE]

CLINICAL DATA: Pain entire neck region radiating to RIGHT shoulder
and down RIGHT arm with tingling in RIGHT arm per patient, no
history of injury

EXAM:
CERVICAL SPINE - 2-3 VIEW

[Series 1: kdxr c-spine ap and lateral · 0.14mm/px · 4 of 4 slices shown]
[im 1/4]
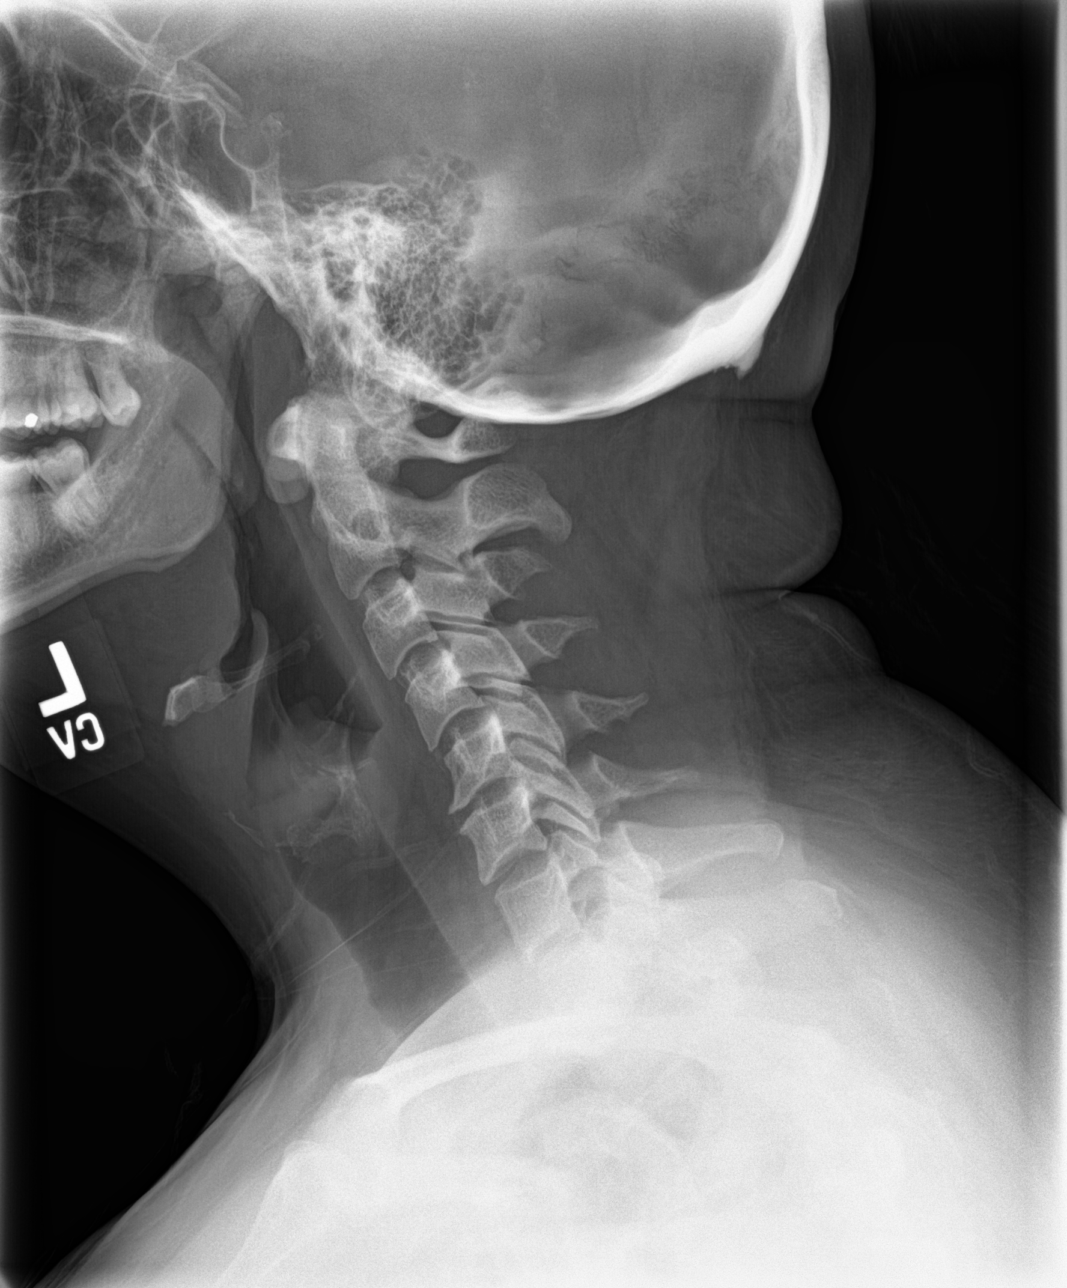
[im 2/4]
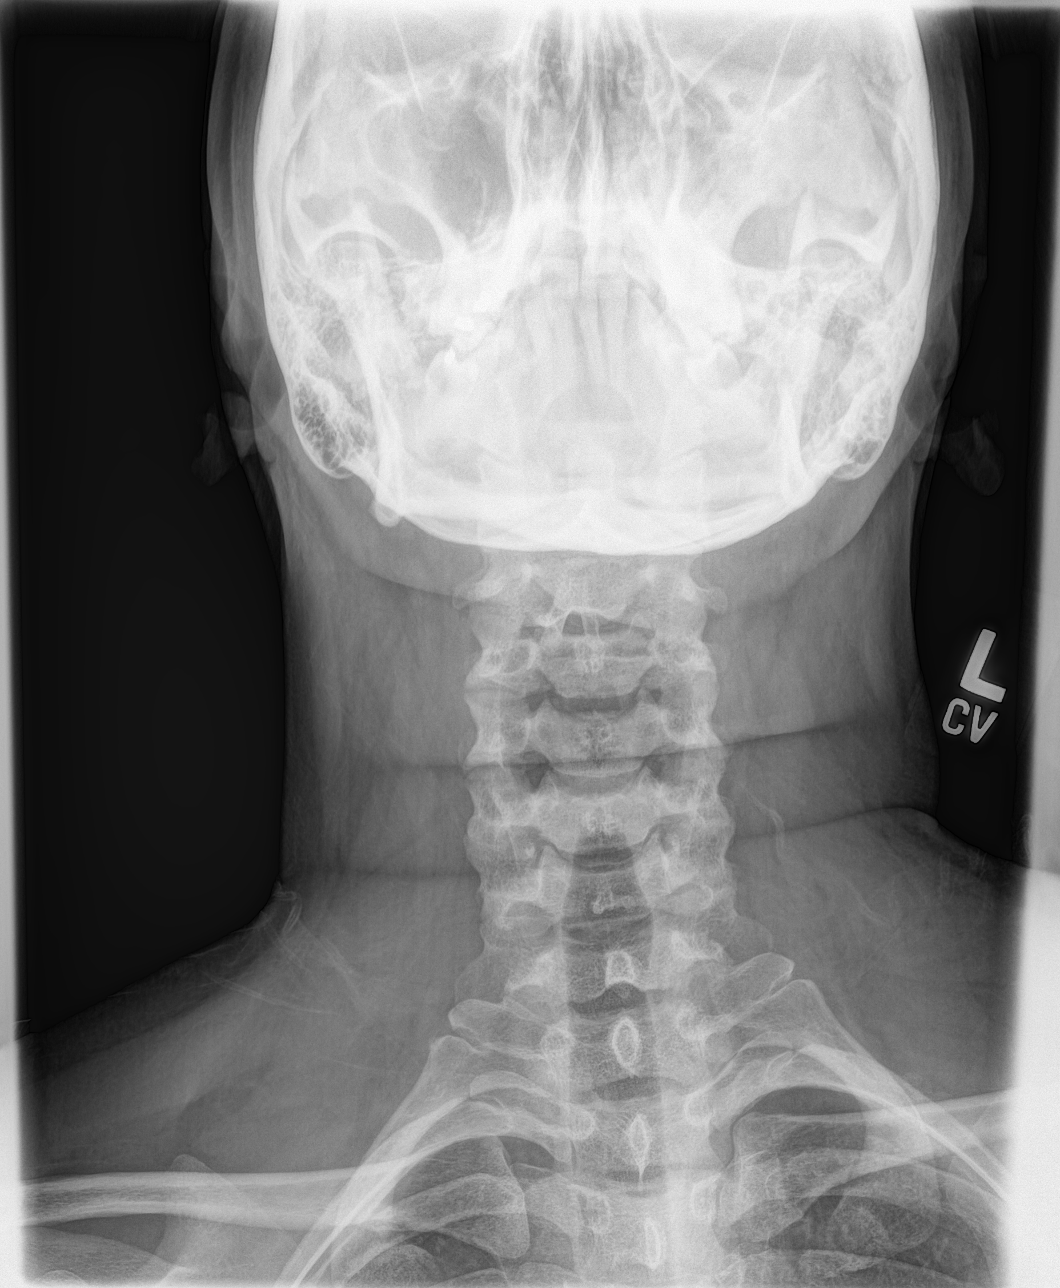
[im 3/4]
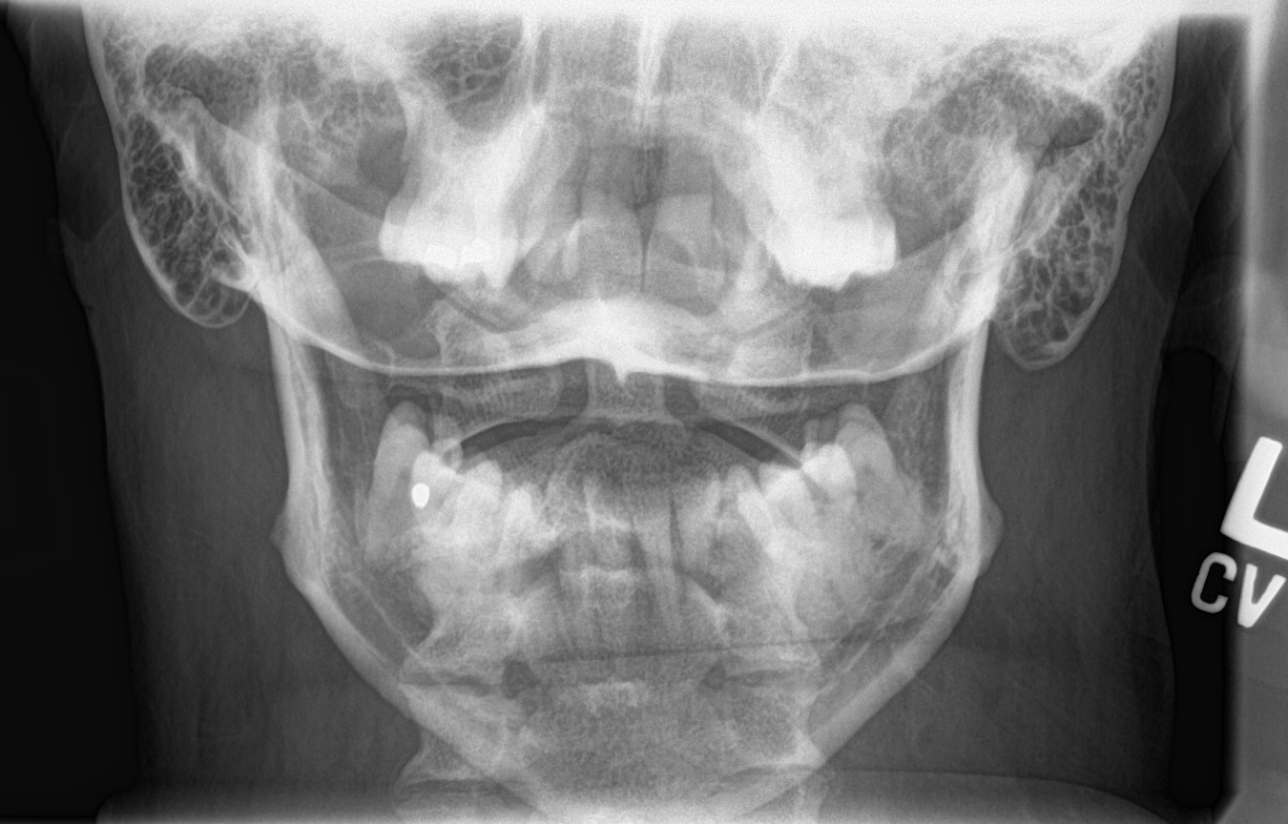
[im 4/4]
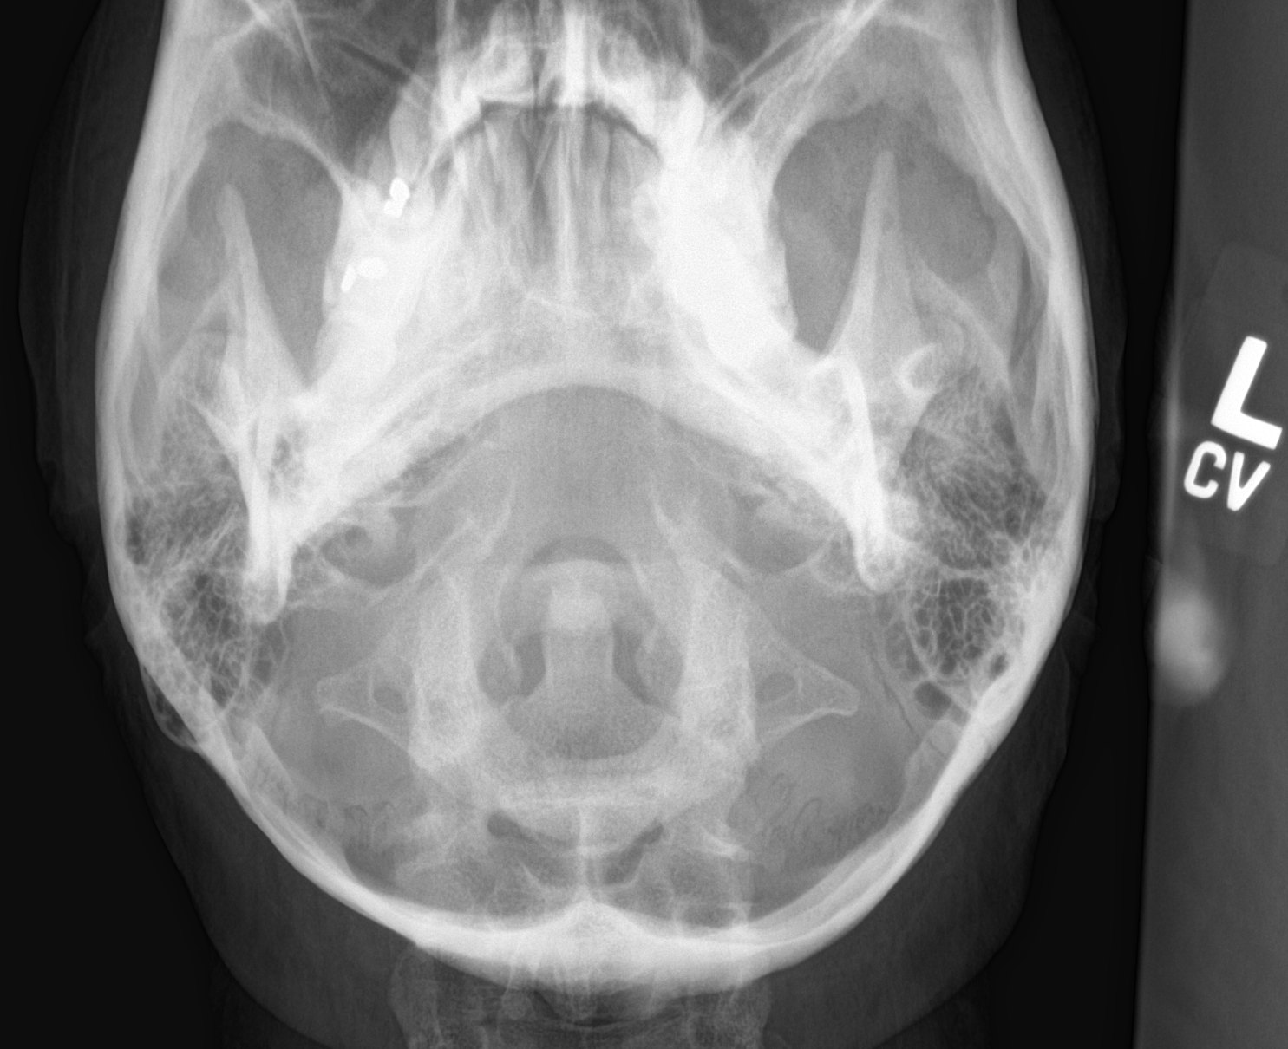

[4 of 4 positions shown; findings below may reference images not displayed]

FINDINGS: Reversal of cervical lordosis question muscle spasm again
identified.

Vertebral body heights maintained.

Disc space narrowing C5-C6 with mild endplate spur formation,
increased.

Prevertebral soft tissues normal thickness.

No acute fracture, subluxation or bone destruction.

Osseous mineralization grossly normal.

C1-C2 alignment normal.

Oblique views not performed.
IMPRESSION: Degenerative disc disease changes C5-C6.

Question muscle spasm.

## 2014-09-09 IMAGING — CR DG THORACIC SPINE 2-3V
1 series · 3 of 3 positions shown · non-contrast
Comparison: None.

CLINICAL DATA: Pain in mid back radiating right shoulder region. No
known injury

EXAM:
THORACIC SPINE - 3 VIEW

[Series 1: kdxr thoracic ap and lateral · 0.14mm/px · 3 of 3 slices shown]
[im 1/3]
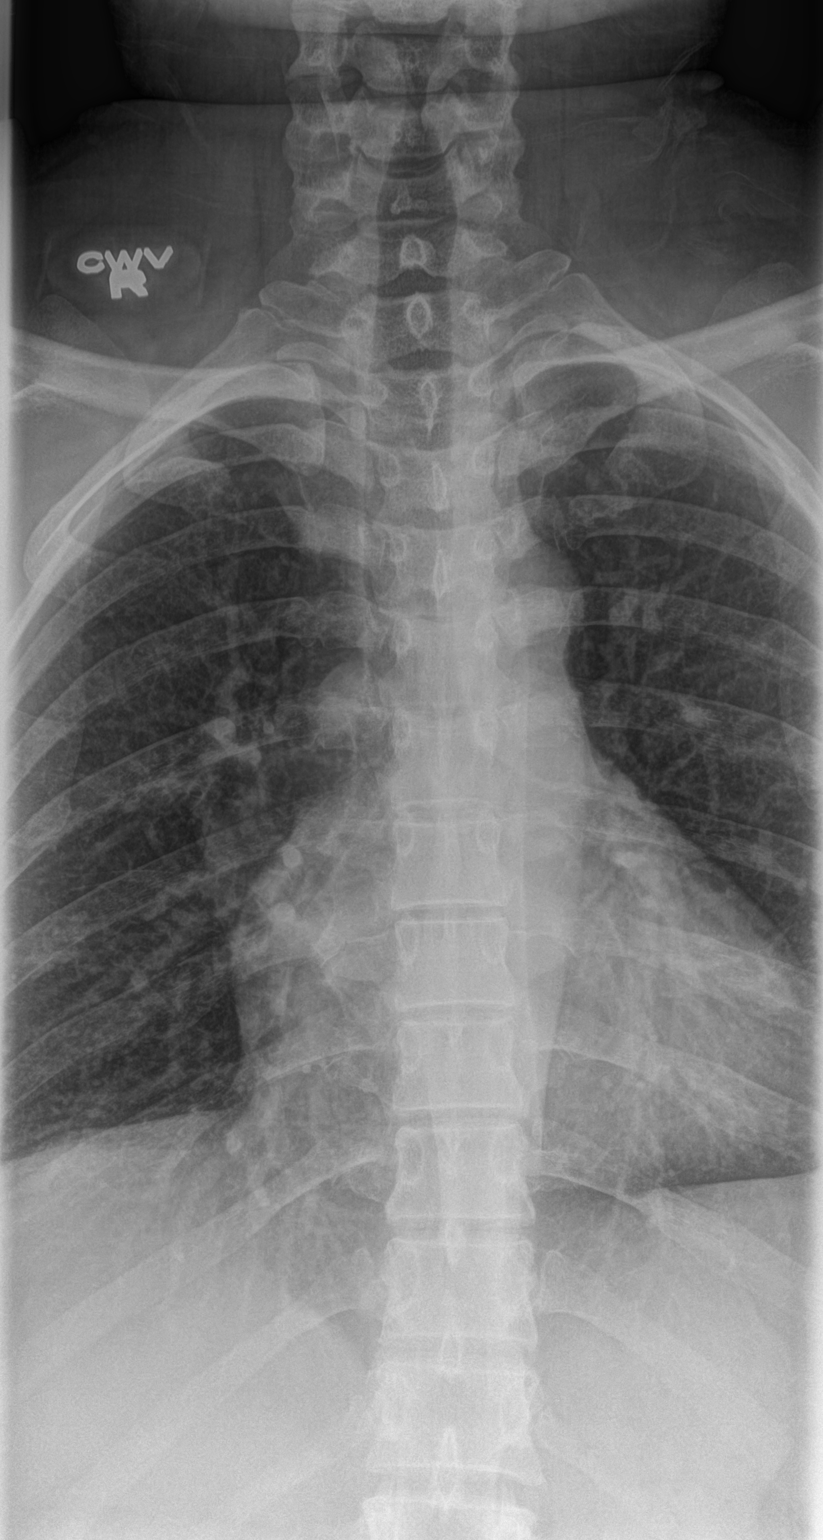
[im 2/3]
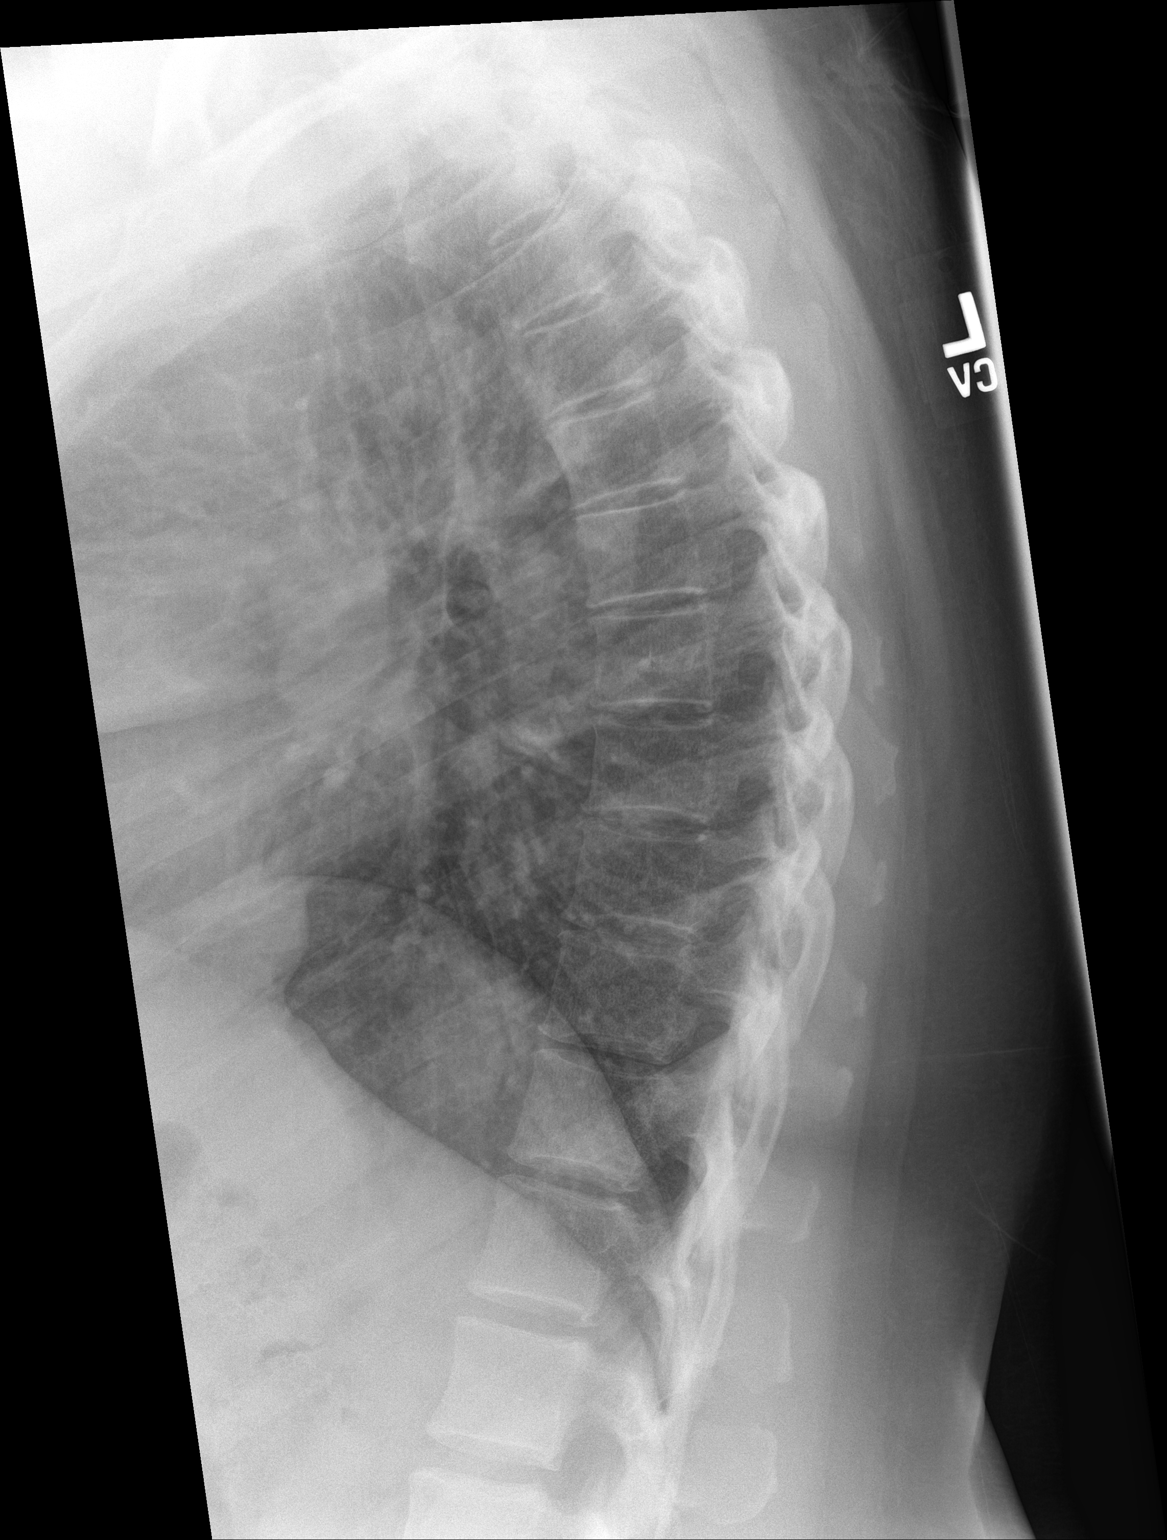
[im 3/3]
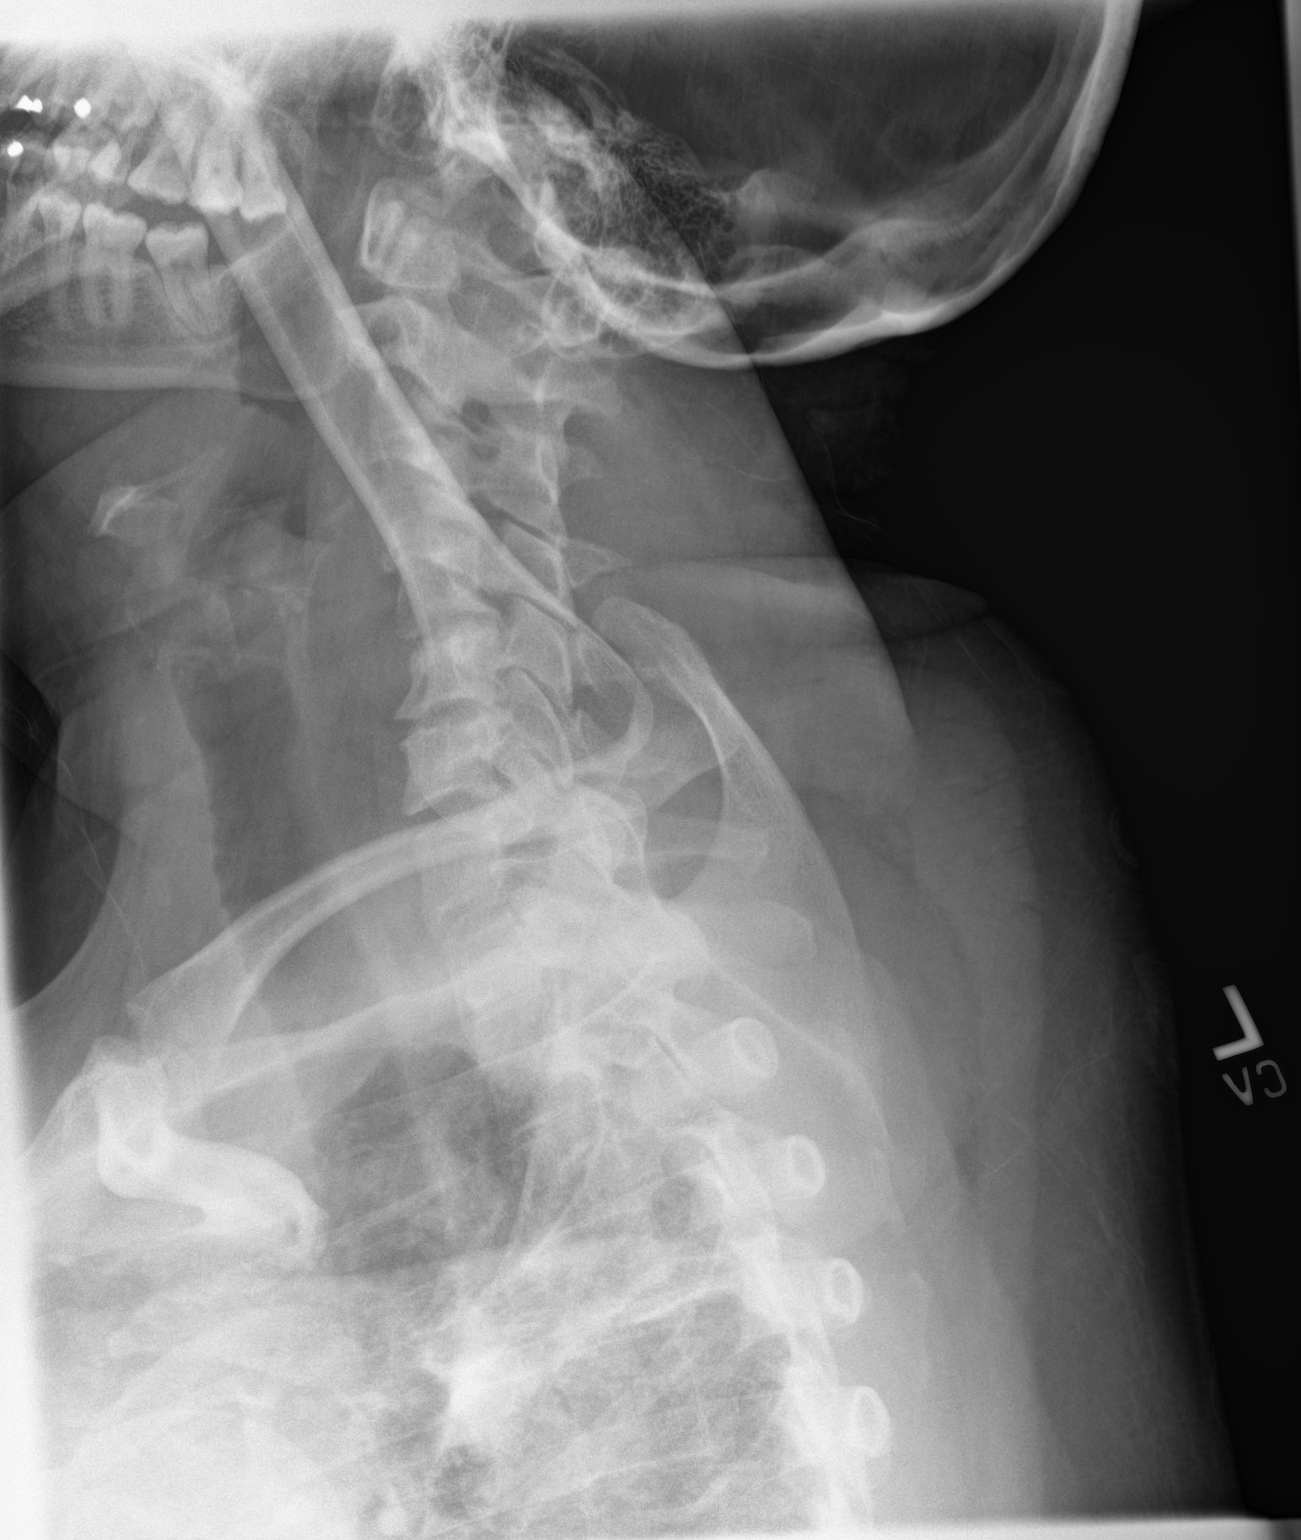

[3 of 3 positions shown; findings below may reference images not displayed]

FINDINGS: Frontal, lateral, and swimmer's views were obtained. There is no
fracture or spondylolisthesis. Disc spaces appear intact. No erosive
change.
IMPRESSION: No fracture or spondylolisthesis.  No appreciable arthropathy.

## 2015-04-15 LAB — BASIC METABOLIC PANEL
BUN: 17 mg/dL (ref 4–21)
Creatinine: 0.8 mg/dL (ref 0.5–1.1)
GLUCOSE: 91 mg/dL
Potassium: 4.4 mmol/L (ref 3.4–5.3)
Sodium: 139 mmol/L (ref 137–147)

## 2015-04-15 LAB — CBC AND DIFFERENTIAL
HCT: 43 % (ref 36–46)
Hemoglobin: 14.3 g/dL (ref 12.0–16.0)
Neutrophils Absolute: 6 /uL
Platelets: 279 10*3/uL (ref 150–399)
WBC: 9.9 10^3/mL

## 2015-04-15 LAB — HEPATIC FUNCTION PANEL
ALT: 44 U/L — AB (ref 7–35)
AST: 25 U/L (ref 13–35)
Alkaline Phosphatase: 95 U/L (ref 25–125)
Bilirubin, Total: 0.3 mg/dL

## 2015-04-15 LAB — HEMOGLOBIN A1C: HEMOGLOBIN A1C: 6 % (ref 4.0–6.0)

## 2015-04-15 LAB — TSH: TSH: 3.19 u[IU]/mL (ref 0.41–5.90)

## 2015-04-16 DIAGNOSIS — F329 Major depressive disorder, single episode, unspecified: Secondary | ICD-10-CM | POA: Insufficient documentation

## 2015-04-16 DIAGNOSIS — Z8739 Personal history of other diseases of the musculoskeletal system and connective tissue: Secondary | ICD-10-CM | POA: Insufficient documentation

## 2015-04-16 DIAGNOSIS — R600 Localized edema: Secondary | ICD-10-CM | POA: Insufficient documentation

## 2015-04-16 DIAGNOSIS — E668 Other obesity: Secondary | ICD-10-CM | POA: Insufficient documentation

## 2015-04-16 DIAGNOSIS — R7303 Prediabetes: Secondary | ICD-10-CM | POA: Insufficient documentation

## 2015-04-16 DIAGNOSIS — F32A Depression, unspecified: Secondary | ICD-10-CM | POA: Insufficient documentation

## 2015-04-16 DIAGNOSIS — G4733 Obstructive sleep apnea (adult) (pediatric): Secondary | ICD-10-CM | POA: Insufficient documentation

## 2015-04-16 DIAGNOSIS — M1990 Unspecified osteoarthritis, unspecified site: Secondary | ICD-10-CM | POA: Insufficient documentation

## 2015-04-16 DIAGNOSIS — K219 Gastro-esophageal reflux disease without esophagitis: Secondary | ICD-10-CM | POA: Insufficient documentation

## 2015-05-08 ENCOUNTER — Encounter: Payer: Self-pay | Admitting: Family Medicine

## 2015-05-21 ENCOUNTER — Encounter: Payer: Self-pay | Admitting: Family Medicine

## 2015-05-21 ENCOUNTER — Ambulatory Visit (INDEPENDENT_AMBULATORY_CARE_PROVIDER_SITE_OTHER): Payer: BLUE CROSS/BLUE SHIELD | Admitting: Family Medicine

## 2015-05-21 VITALS — BP 120/80 | HR 72 | Temp 98.6°F | Resp 16 | Ht 63.0 in | Wt 255.0 lb

## 2015-05-21 DIAGNOSIS — R609 Edema, unspecified: Secondary | ICD-10-CM

## 2015-05-21 DIAGNOSIS — N926 Irregular menstruation, unspecified: Secondary | ICD-10-CM | POA: Diagnosis not present

## 2015-05-21 DIAGNOSIS — F329 Major depressive disorder, single episode, unspecified: Secondary | ICD-10-CM | POA: Diagnosis not present

## 2015-05-21 DIAGNOSIS — F32A Depression, unspecified: Secondary | ICD-10-CM

## 2015-05-21 DIAGNOSIS — E668 Other obesity: Secondary | ICD-10-CM

## 2015-05-21 MED ORDER — BUPROPION HCL ER (SR) 150 MG PO TB12: 150.0000 mg | ORAL_TABLET | Freq: Two times a day (BID) | ORAL | Status: DC

## 2015-05-21 MED ORDER — BUPROPION HCL ER (SR) 150 MG PO TB12
150.0000 mg | ORAL_TABLET | Freq: Two times a day (BID) | ORAL | Status: DC
Start: 2015-05-21 — End: 2015-05-21

## 2015-05-21 NOTE — Progress Notes (Signed)
Subjective:    Patient ID: Sandra Wilkins, female    DOB: Oct 26, 1966, 49 y.o.   MRN: [id number]  HPI Edema: Patient complains of edema. The location of the edema is lower leg(s) bilateral.  The edema has been mild and gradually improving since last ov 04/14/2015. The swelling has been aggravated by activity, relieved by diuretics, and rest, and been associated with shortness of breath. Cardiac risk factors include obesity (BMI >= 30 kg/m2). Patient was started on Lasix 20 mg and potassium chloride 10 MEQ.  Depression: Patient complains of depression. She complains of depressed mood. Onset was approximately 6 months ago, gradually worsening since that time.  She denies current suicidal and homicidal plan or intent.   Family history significant for depression.Possible organic causes contributing are: none.  Risk factors: none Previous treatment includes Zoloft . She complains of the following side effects from the treatment: none. Patient reports that she does not understand why she is feeling so depressed since she is planning a wedding with her boyfriend of 7 years. Patient reports that she has very little tolerance and patience towards other people. Patient reports that out of no where she cut and colored her hair some thing that she had never done before. Patient reports that she did cry after she finally realized what she did. Patient reports that she crys for every thing.  Zoloft does make her sleepy.    Review of Systems  Constitutional: Positive for activity change and fatigue.  Musculoskeletal: Positive for myalgias and back pain.  Psychiatric/Behavioral: Positive for sleep disturbance, dysphoric mood and decreased concentration. The patient is nervous/anxious.    Patient Active Problem List   Diagnosis Date Noted  . Arthritis 04/16/2015  . Clinical depression 04/16/2015  . Acid reflux 04/16/2015  . Accumulation of fluid in tissues 04/16/2015  . Personal history of musculoskeletal  disorder 04/16/2015  . Blood glucose elevated 04/16/2015  . Extreme obesity 04/16/2015  . Obstructive apnea 04/16/2015  . Anxiety disorder 09/05/2006  . Cannot sleep 08/11/2006   Past Medical History  Diagnosis Date  . Depression   . Sleep apnea    Current Outpatient Prescriptions on File Prior to Visit  Medication Sig  . Ibuprofen 200 MG CAPS Take by mouth.  . loratadine (CLARITIN REDITABS) 10 MG dissolvable tablet Take by mouth.  . Multiple Vitamins-Minerals (WOMENS MULTIVITAMIN PLUS) TABS Take by mouth.  . Omeprazole 20 MG TBEC Take by mouth.  . Probiotic CAPS Take by mouth.  . vitamin C (ASCORBIC ACID) 500 MG tablet Take by mouth.  . furosemide (LASIX) 20 MG tablet Take by mouth.  . potassium chloride (K-DUR) 10 MEQ tablet Take by mouth.  . sertraline (ZOLOFT) 50 MG tablet Take by mouth.   No current facility-administered medications on file prior to visit.   Allergies  Allergen Reactions  . Codeine   . Fish Oil     Indigestion  . Penicillins Hives  . Sulfa Antibiotics Itching   Past Surgical History  Procedure Laterality Date  . Tonsillectomy and adenoidectomy  1972  . Thyroidectomy, partial     History   Social History  . Marital Status: Single    Spouse Name: N/A  . Number of Children: N/A  . Years of Education: N/A   Occupational History  . Not on file.   Social History Main Topics  . Smoking status: Former Smoker    Quit date: 11/13/2005  . Smokeless tobacco: Never Used  . Alcohol Use: No  . Drug  Use: No  . Sexual Activity: Not on file   Other Topics Concern  . Not on file   Social History Narrative   Family History  Problem Relation Age of Onset  . Depression Mother   . Arthritis Mother   . Emphysema Mother   . Congestive Heart Failure Mother   . Hyperlipidemia Father   . Depression Sister   . Bipolar disorder Brother   . Seizures Brother   . Arthritis Maternal Grandmother   . Asthma Maternal Grandmother   . Congestive Heart Failure  Maternal Grandfather   . Dementia Paternal Grandfather   . Depression Sister         Objective:   Physical Exam  Constitutional: She is oriented to person, place, and time. She appears well-developed and well-nourished.  Neurological: She is alert and oriented to person, place, and time. She has normal reflexes.  Psychiatric: She has a normal mood and affect. Her behavior is normal. Judgment and thought content normal.     BP 120/80 mmHg  Pulse 72  Temp(Src) 98.6 F (37 C) (Oral)  Resp 16  Ht 5\' 3"  (1.6 m)  Wt 255 lb (115.667 kg)  BMI 45.18 kg/m2  LMP  (LMP Unknown)     Assessment & Plan:  1. Clinical depression Will add medication and see if improves mood.  - buPROPion (WELLBUTRIN SR) 150 MG 12 hr tablet; Take 1 tablet (150 mg total) by mouth 2 (two) times daily. One a day for 3 days and then one twice a day  Dispense: 60 tablet; Refill: 5  2. Accumulation of fluid in tissues Condition is stable. Please continue current medication and  plan of care as noted.    3. Extreme obesity Is very frustrated. Will start with Wellbutrin and see if helps.    4. Irregular menses Suspect related to stress.  Will check labs.   - Follicle stimulating hormone - Luteinizing hormone - Prolactin   Margarita Rana, MD

## 2015-05-22 ENCOUNTER — Telehealth: Payer: Self-pay

## 2015-05-22 LAB — LUTEINIZING HORMONE: LH: 6.1 m[IU]/mL

## 2015-05-22 LAB — FOLLICLE STIMULATING HORMONE: FSH: 13.8 m[IU]/mL

## 2015-05-22 LAB — PROLACTIN: PROLACTIN: 5.2 ng/mL (ref 4.8–23.3)

## 2015-05-22 NOTE — Telephone Encounter (Signed)
LMTCB 05/22/2015  Thanks,   -Mickel Baas

## 2015-05-22 NOTE — Telephone Encounter (Signed)
-----   Message from Margarita Rana, MD sent at 05/22/2015  6:57 AM EDT ----- Labs normal. Think irregular menses is related to stress. Continue with current plan and follow up as scheduled. Thanks- Dr Jerilynn Mages

## 2015-05-25 NOTE — Telephone Encounter (Signed)
Pt advised as directed below.   Thanks,   -Heela Heishman  

## 2015-05-25 NOTE — Telephone Encounter (Signed)
LMTCB. sd  

## 2015-06-03 ENCOUNTER — Ambulatory Visit: Payer: Self-pay | Admitting: Family Medicine

## 2015-06-10 ENCOUNTER — Encounter: Payer: Self-pay | Admitting: Family Medicine

## 2015-06-10 ENCOUNTER — Ambulatory Visit (INDEPENDENT_AMBULATORY_CARE_PROVIDER_SITE_OTHER): Payer: BLUE CROSS/BLUE SHIELD | Admitting: Family Medicine

## 2015-06-10 VITALS — BP 118/84 | HR 92 | Temp 98.0°F | Resp 16 | Wt 255.0 lb

## 2015-06-10 DIAGNOSIS — F329 Major depressive disorder, single episode, unspecified: Secondary | ICD-10-CM

## 2015-06-10 DIAGNOSIS — R748 Abnormal levels of other serum enzymes: Secondary | ICD-10-CM | POA: Diagnosis not present

## 2015-06-10 DIAGNOSIS — N926 Irregular menstruation, unspecified: Secondary | ICD-10-CM | POA: Diagnosis not present

## 2015-06-10 DIAGNOSIS — F411 Generalized anxiety disorder: Secondary | ICD-10-CM

## 2015-06-10 DIAGNOSIS — F32A Depression, unspecified: Secondary | ICD-10-CM

## 2015-06-10 DIAGNOSIS — K760 Fatty (change of) liver, not elsewhere classified: Secondary | ICD-10-CM | POA: Insufficient documentation

## 2015-06-10 NOTE — Progress Notes (Signed)
Subjective:    Patient ID: Sandra Wilkins, female    DOB: May 21, 1966, 49 y.o.   MRN: [id number]  HPI  Depression: Patient complains of depression. She complains of depressed mood, difficulty concentrating, fatigue, impaired memory and insomnia. Onset was approximately 3 years ago, gradually improving since that time.  She denies current suicidal and homicidal plan or intent.   Family history significant for alcoholism, anxiety and depression.Possible organic causes contributing are: none.  Risk factors: positive family history in  brother(s), grandmother, mother and sister(s) Previous treatment includes Wellbutrin and individual therapy. She complains of the following side effects from the treatment: none. Pt reports that all of her sx have improved.  Irregular Menstruation: Patient complains of irregular menses. No LMP recorded (lmp unknown). Periods are irregular, lasting 3 days. Dysmenorrhea:none. Cyclic symptoms include none.  Current contraception: none. History of infertility: yes. History of abnormal Pap smear: no  Has not seen gyn in a while.    Obesity Pt was started on Wellbutrin at [facility] for clinical depression, as well as helpfully improving obesity. There has been no weight change since LOV 2 weeks ago.  Review of Systems  Constitutional: Positive for diaphoresis, activity change ("Activity has increased") and fatigue. Negative for fever, chills, appetite change and unexpected weight change.  Respiratory: Positive for cough, shortness of breath and wheezing. Negative for chest tightness.   Cardiovascular: Positive for leg swelling. Negative for chest pain and palpitations.  Neurological: Positive for headaches.  Psychiatric/Behavioral: Positive for confusion, sleep disturbance, dysphoric mood, decreased concentration and agitation. Negative for suicidal ideas, hallucinations, behavioral problems and self-injury. The patient is nervous/anxious. The patient is not hyperactive.        Patient Active Problem List   Diagnosis Date Noted  . Irregular menses 05/21/2015  . Arthritis 04/16/2015  . Clinical depression 04/16/2015  . Acid reflux 04/16/2015  . Accumulation of fluid in tissues 04/16/2015  . Personal history of musculoskeletal disorder 04/16/2015  . Blood glucose elevated 04/16/2015  . Extreme obesity 04/16/2015  . Obstructive apnea 04/16/2015  . Anxiety disorder 09/05/2006  . Cannot sleep 08/11/2006   Past Medical History  Diagnosis Date  . Depression   . Sleep apnea    Current Outpatient Prescriptions on File Prior to Visit  Medication Sig  . buPROPion (WELLBUTRIN SR) 150 MG 12 hr tablet Take 1 tablet (150 mg total) by mouth 2 (two) times daily. One a day for 3 days and then one twice a day  . furosemide (LASIX) 20 MG tablet Take by mouth.  . Ibuprofen 200 MG CAPS Take by mouth.  . loratadine (CLARITIN REDITABS) 10 MG dissolvable tablet Take by mouth.  . Multiple Vitamins-Minerals (WOMENS MULTIVITAMIN PLUS) TABS Take by mouth.  . Omeprazole 20 MG TBEC Take by mouth.  . potassium chloride (K-DUR) 10 MEQ tablet Take by mouth.  . Probiotic CAPS Take by mouth.  . sertraline (ZOLOFT) 50 MG tablet Take by mouth.  . vitamin C (ASCORBIC ACID) 500 MG tablet Take by mouth.   No current facility-administered medications on file prior to visit.   Allergies  Allergen Reactions  . Codeine   . Fish Oil     Indigestion  . Penicillins Hives  . Sulfa Antibiotics Itching   Past Surgical History  Procedure Laterality Date  . Tonsillectomy and adenoidectomy  1972  . Thyroidectomy, partial     History   Social History  . Marital Status: Single    Spouse Name: N/A  . Number of Children:  N/A  . Years of Education: N/A   Occupational History  . full time    Social History Main Topics  . Smoking status: Former Smoker    Quit date: 11/13/2005  . Smokeless tobacco: Never Used  . Alcohol Use: No  . Drug Use: No  . Sexual Activity: Not on file    Other Topics Concern  . Not on file   Social History Narrative   Family History  Problem Relation Age of Onset  . Depression Mother   . Arthritis Mother   . Emphysema Mother   . Congestive Heart Failure Mother   . Hyperlipidemia Father   . Depression Sister   . Bipolar disorder Brother   . Seizures Brother   . Arthritis Maternal Grandmother   . Asthma Maternal Grandmother   . Congestive Heart Failure Maternal Grandfather   . Dementia Paternal Grandfather   . Depression Sister        Objective:   Physical Exam  Constitutional: She is oriented to person, place, and time. She appears well-developed and well-nourished.  Neurological: She is alert and oriented to person, place, and time.  Psychiatric: She has a normal mood and affect. Her behavior is normal. Judgment and thought content normal.   BP 118/84 mmHg  Pulse 92  Temp(Src) 98 F (36.7 C) (Oral)  Resp 16  Wt 255 lb (115.667 kg)  SpO2 95%  LMP 03/11/2015 (Approximate)       Assessment & Plan:  1. Elevated liver enzymes Has not had prior work up.  Will order.  - Ambulatory referral to Gastroenterology  2. Irregular menses Has not had cycle in a while.  Will check for menopause.  - Follicle stimulating hormone - Luteinizing hormone  3. Generalized anxiety disorder Stable. Continue medication. Call if worsens or does not improve.   4. Clinical depression Stable. Follow up at needed.   Margarita Rana, MD

## 2015-06-11 ENCOUNTER — Telehealth: Payer: Self-pay

## 2015-06-11 LAB — LUTEINIZING HORMONE: LH: 3.2 m[IU]/mL

## 2015-06-11 LAB — FOLLICLE STIMULATING HORMONE: FSH: 2.4 m[IU]/mL

## 2015-06-11 NOTE — Telephone Encounter (Signed)
Diagnoses have been transferred over. Can review and make changes with patient at follow up ov. Thanks.

## 2015-06-11 NOTE — Telephone Encounter (Signed)
Advised pt of results, pt verbalized fully understanding. Pt agreed to follow-up with Gyn if not having periods.

## 2015-06-20 ENCOUNTER — Encounter: Payer: Self-pay | Admitting: Family Medicine

## 2015-06-22 NOTE — Telephone Encounter (Signed)
LMTCB 06/22/2015  I was not able to find her sleep study.  I need to know when and where her last study was completed.   Thanks,   -Mickel Baas

## 2015-06-22 NOTE — Telephone Encounter (Signed)
Please try to find sleep study. Thanks.

## 2015-06-26 ENCOUNTER — Other Ambulatory Visit: Payer: Self-pay

## 2015-06-26 DIAGNOSIS — F32A Depression, unspecified: Secondary | ICD-10-CM

## 2015-06-26 DIAGNOSIS — F411 Generalized anxiety disorder: Secondary | ICD-10-CM

## 2015-06-26 DIAGNOSIS — F329 Major depressive disorder, single episode, unspecified: Secondary | ICD-10-CM

## 2015-06-26 MED ORDER — BUPROPION HCL ER (SR) 150 MG PO TB12: 150.0000 mg | ORAL_TABLET | Freq: Two times a day (BID) | ORAL | Status: DC

## 2015-07-01 ENCOUNTER — Telehealth: Payer: Self-pay | Admitting: Family Medicine

## 2015-07-01 ENCOUNTER — Other Ambulatory Visit: Payer: Self-pay | Admitting: Family Medicine

## 2015-07-01 DIAGNOSIS — F32A Depression, unspecified: Secondary | ICD-10-CM

## 2015-07-01 DIAGNOSIS — F329 Major depressive disorder, single episode, unspecified: Secondary | ICD-10-CM

## 2015-07-01 MED ORDER — BUPROPION HCL ER (SR) 150 MG PO TB12: 150.0000 mg | ORAL_TABLET | Freq: Two times a day (BID) | ORAL | Status: DC

## 2015-07-01 NOTE — Telephone Encounter (Signed)
Spoke with pt and she stated that she is taking Wellbutrin bid.  Thanks,

## 2015-07-01 NOTE — Telephone Encounter (Signed)
Please call and clarify if patient taking  Wellbutrin, is not, please update chart and call pharmacy and cancel. Thanks.

## 2015-07-17 ENCOUNTER — Encounter: Payer: Self-pay | Admitting: Family Medicine

## 2015-07-17 NOTE — Telephone Encounter (Signed)
Sandra Wilkins- Can you check on this and notify patient. Thanks.

## 2015-07-21 ENCOUNTER — Encounter: Payer: Self-pay | Admitting: Family Medicine

## 2015-07-23 NOTE — Telephone Encounter (Signed)
Sarah-  Can you look into this CPAP titration. Thanks.

## 2015-07-28 IMAGING — US US ABDOMEN COMPLETE
1 series · 13 of 25 positions shown · non-contrast
Comparison: None.

CLINICAL DATA: Upper abdominal pain for 1 week with nausea and
vomiting.

EXAM:
ABDOMEN ULTRASOUND COMPLETE

[Series 1: us abdomen complete · 0.28mm/px · 13 of 122 slices shown]
[im 1/122]
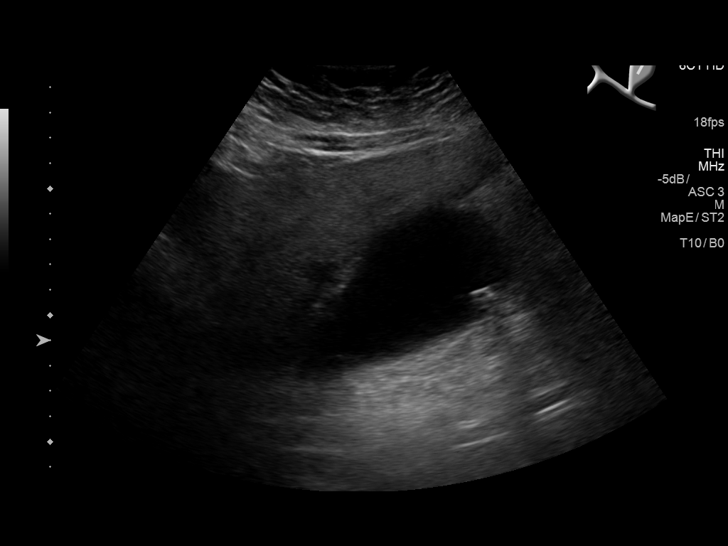
[im 11/122]
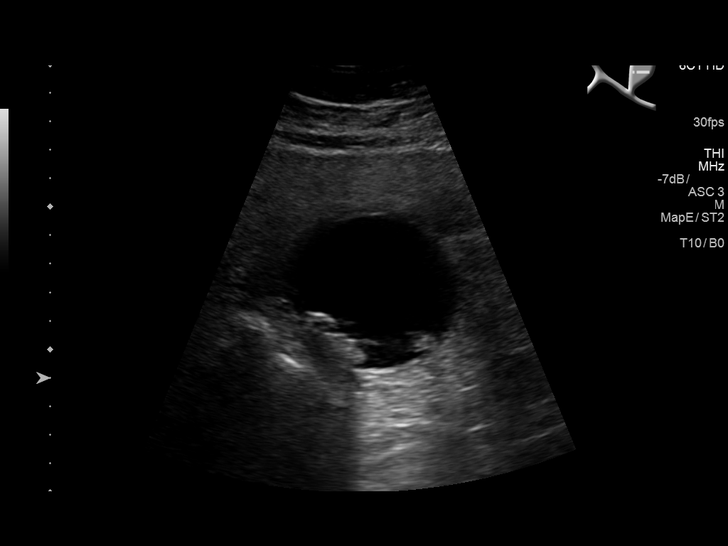
[im 21/122]
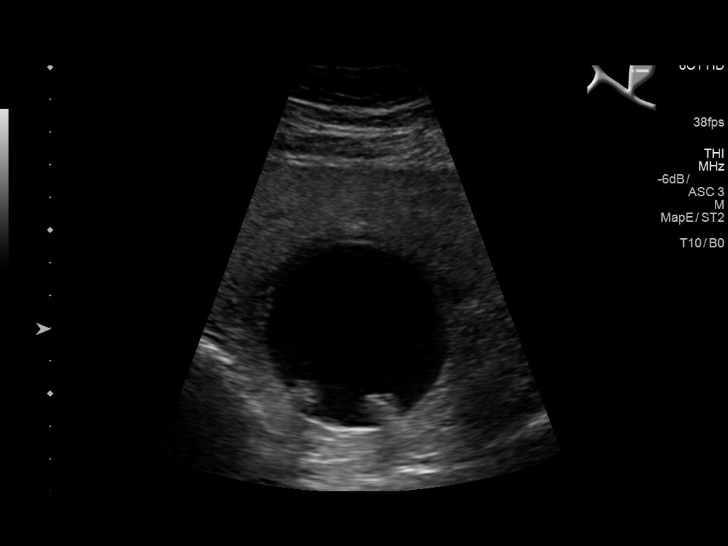
[im 31/122]
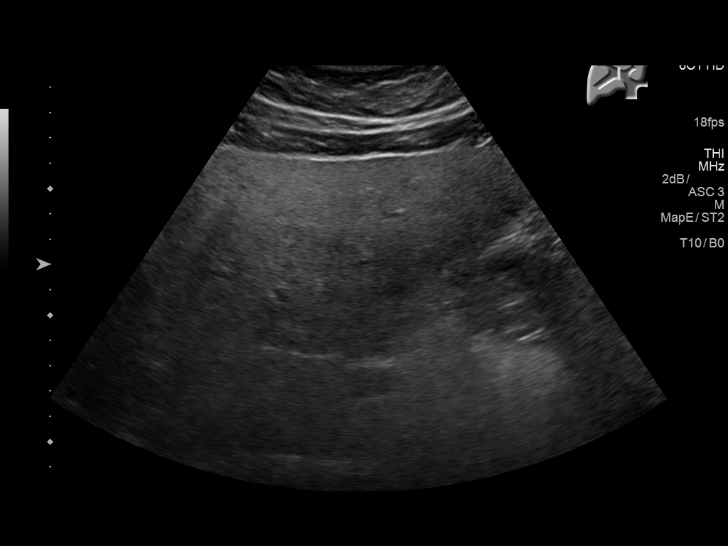
[im 41/122]
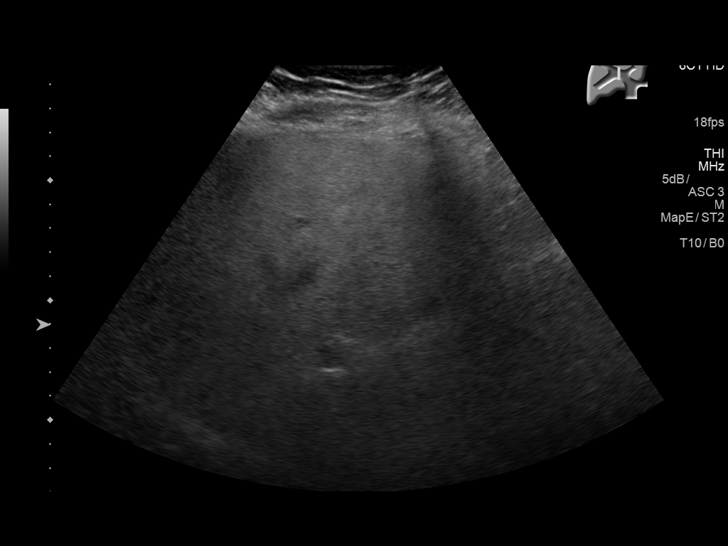
[im 51/122]
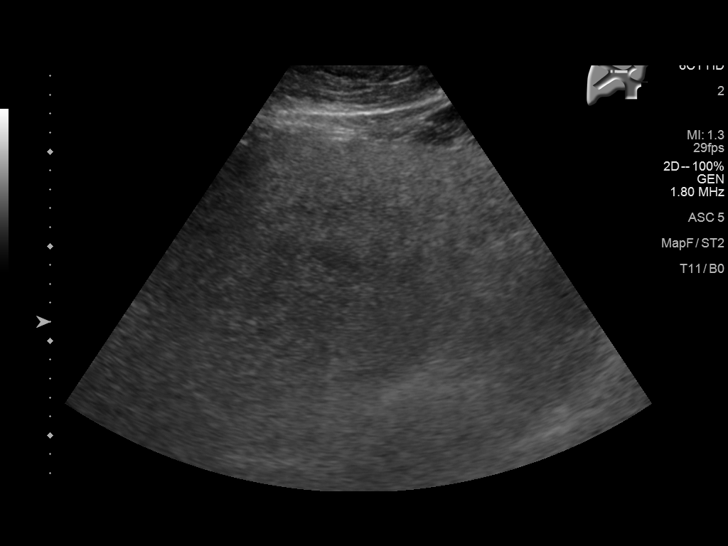
[im 61/122]
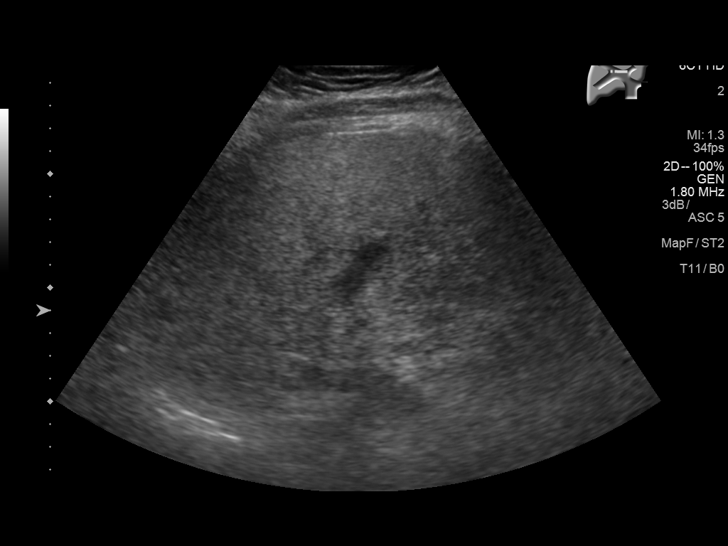
[im 71/122]
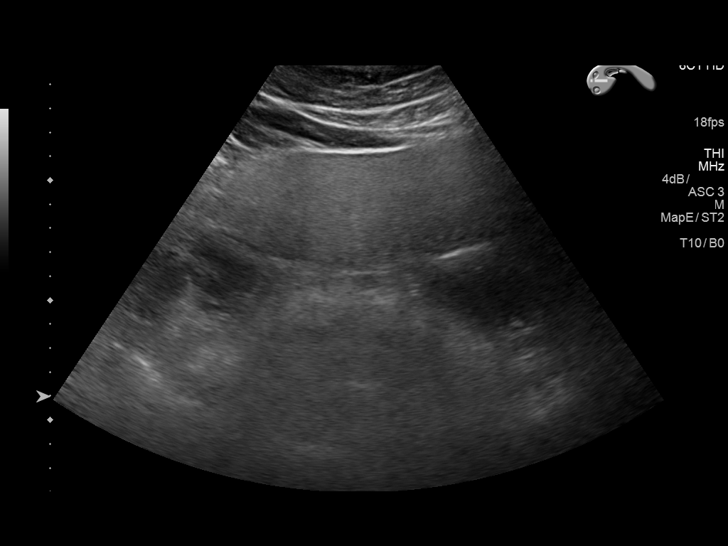
[im 81/122]
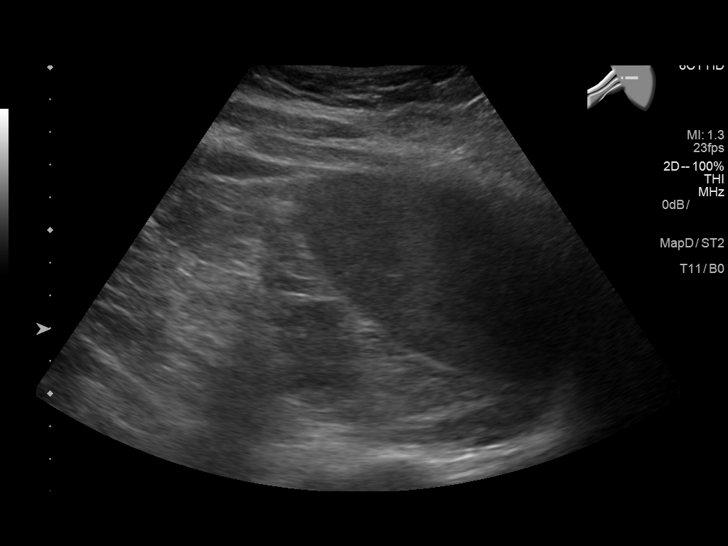
[im 91/122]
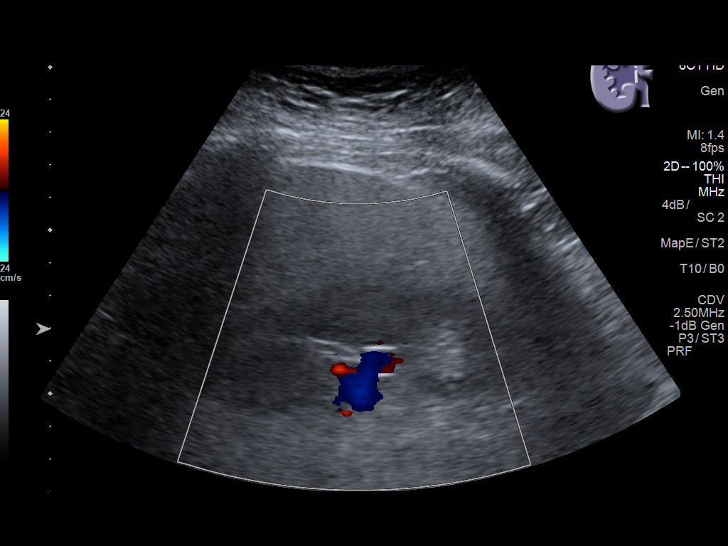
[im 101/122]
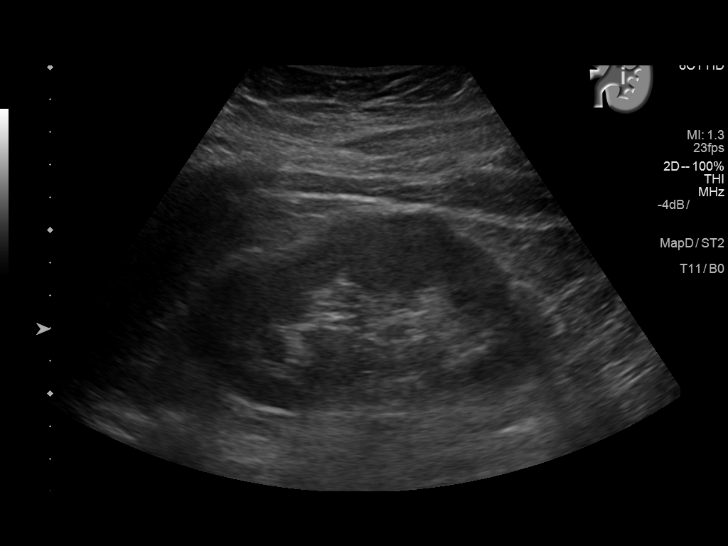
[im 111/122]
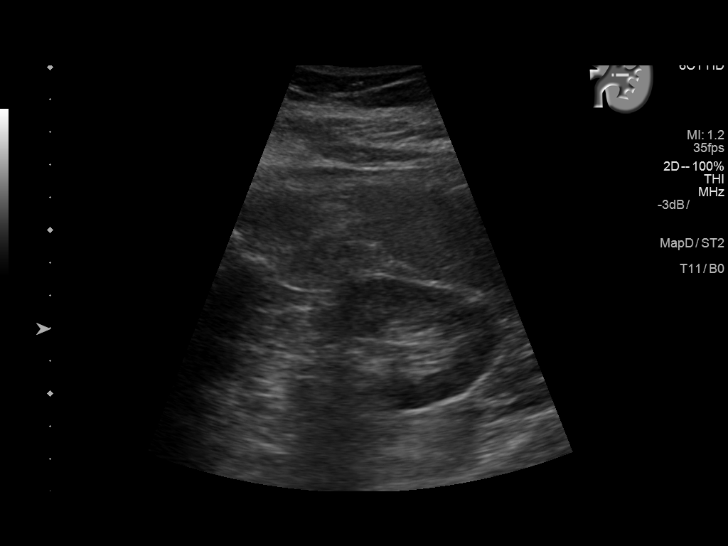
[im 122/122]
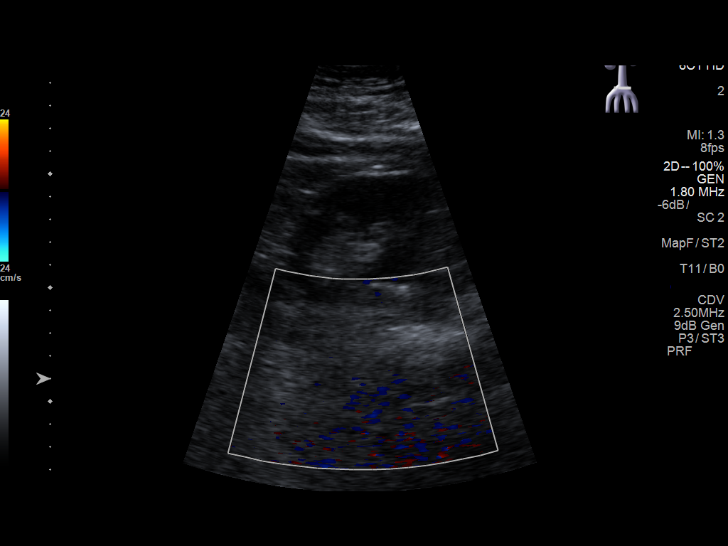

[13 of 25 positions shown; findings below may reference images not displayed]

FINDINGS: Gallbladder: Multiple mobile gallstones. The gallbladder is
physiologically full for a NPO patient. There is borderline wall
thickening measuring up to 3 mm. Hypoechogenicity around the
gallbladder wall may be fatty sparing. No hypoechogenicity where the
gallbladder wall does not touch the liver to suggest pericholecystic
edema. Sonographic Murphy sign was negative per report.

Common bile duct: Diameter: 4 mm

Liver: Echogenic with poor acoustic penetration. No focal lesion is
seen, although limited by the degree of steatosis.

IVC: No abnormality visualized.

Pancreas: Visualized portion unremarkable.

Spleen: Size and appearance within normal limits.

Right Kidney: Length: 11 cm. Echogenicity within normal limits. No
mass or hydronephrosis visualized.

Left Kidney: Length: 11 cm. Echogenicity within normal limits. No
mass or hydronephrosis visualized.

Abdominal aorta: No aneurysm visualized.
IMPRESSION: 1. Cholelithiasis. Borderline gallbladder wall thickening but no
Murphy sign to suggest acute cholecystitis.
2. Hepatic steatosis.

## 2015-09-04 ENCOUNTER — Encounter: Payer: Self-pay | Admitting: Family Medicine

## 2015-09-10 ENCOUNTER — Encounter: Payer: Self-pay | Admitting: Family Medicine

## 2015-09-15 ENCOUNTER — Encounter: Payer: Self-pay | Admitting: Family Medicine

## 2015-10-10 ENCOUNTER — Encounter: Payer: Self-pay | Admitting: Family Medicine

## 2015-10-21 ENCOUNTER — Ambulatory Visit (INDEPENDENT_AMBULATORY_CARE_PROVIDER_SITE_OTHER): Payer: BLUE CROSS/BLUE SHIELD | Admitting: Family Medicine

## 2015-10-21 ENCOUNTER — Other Ambulatory Visit: Payer: Self-pay | Admitting: Family Medicine

## 2015-10-21 VITALS — BP 132/84 | HR 76 | Temp 98.2°F | Resp 16 | Wt 248.0 lb

## 2015-10-21 DIAGNOSIS — S93401A Sprain of unspecified ligament of right ankle, initial encounter: Secondary | ICD-10-CM

## 2015-10-21 DIAGNOSIS — K589 Irritable bowel syndrome without diarrhea: Secondary | ICD-10-CM | POA: Diagnosis not present

## 2015-10-21 DIAGNOSIS — F329 Major depressive disorder, single episode, unspecified: Secondary | ICD-10-CM

## 2015-10-21 DIAGNOSIS — Z23 Encounter for immunization: Secondary | ICD-10-CM | POA: Diagnosis not present

## 2015-10-21 DIAGNOSIS — F411 Generalized anxiety disorder: Secondary | ICD-10-CM | POA: Diagnosis not present

## 2015-10-21 DIAGNOSIS — N926 Irregular menstruation, unspecified: Secondary | ICD-10-CM | POA: Diagnosis not present

## 2015-10-21 DIAGNOSIS — M25571 Pain in right ankle and joints of right foot: Secondary | ICD-10-CM

## 2015-10-21 DIAGNOSIS — S93409A Sprain of unspecified ligament of unspecified ankle, initial encounter: Secondary | ICD-10-CM | POA: Insufficient documentation

## 2015-10-21 DIAGNOSIS — G4733 Obstructive sleep apnea (adult) (pediatric): Secondary | ICD-10-CM

## 2015-10-21 DIAGNOSIS — F32A Depression, unspecified: Secondary | ICD-10-CM

## 2015-10-21 DIAGNOSIS — R748 Abnormal levels of other serum enzymes: Secondary | ICD-10-CM

## 2015-10-21 DIAGNOSIS — F339 Major depressive disorder, recurrent, unspecified: Secondary | ICD-10-CM

## 2015-10-21 NOTE — Patient Instructions (Signed)

## 2015-10-21 NOTE — Progress Notes (Signed)
Patient ID: Sandra Wilkins, female   DOB: 1966-01-02, 49 y.o.   MRN: [id number]       Patient: Sandra Wilkins Female    DOB: Mar 22, 1966   49 y.o.   MRN: [id number] Visit Date: 10/21/2015  Today's Provider: Margarita Rana, MD   Chief Complaint  Patient presents with  . Abnormal Lab  . Menstrual Problem  . Anxiety  . Depression  . Ankle Pain   Subjective:    Anxiety Symptoms include insomnia (Uses CPAP but not sure if working as well as it used to.  ), irritability, nervous/anxious behavior, panic and shortness of breath. Patient reports no chest pain, compulsions, confusion, decreased concentration, depressed mood, dizziness, dry mouth, feeling of choking, malaise, muscle tension, nausea, obsessions, palpitations, restlessness or suicidal ideas.    Depression        This is a chronic problem.  The problem occurs daily.  The problem has been waxing and waning since onset.  Associated symptoms include insomnia (Uses CPAP but not sure if working as well as it used to.  ).  Associated symptoms include no decreased concentration, no restlessness and no suicidal ideas.  Past medical history includes anxiety.   Ankle Pain  There was no injury mechanism. The pain is present in the right ankle. The quality of the pain is described as burning and stabbing. The patient is experiencing no pain (Not currenlty). The pain has been intermittent since onset. Associated symptoms include numbness (fourth toe numb, burning of the whole ankle randomly). Pertinent negatives include no loss of motion, loss of sensation, muscle weakness or tingling. Inability to bear weight: Random, not constantly. Nothing aggravates the symptoms. She has tried nothing for the symptoms.    Irregular menses Patient was seen by Gyn for her irregular menses.  She did have labs done but has not yet received the results.  The patient states that her gyn thinks that her depression and anxiety are related to her hormonal changes.  She   was not given any  follow up but was instructed to wait on the lab results to decide the next step. Patient did have Mammogram done while at the gyn office.   Elevated liver enzymes Patient was seen by GI.  She was told that she has IBS with diarrhea.  She was started on Dicyclomine as needed.  She states she has only had to take it twice and notes that it did help her symptoms when she took it.  Her labs were still elevated when checked by GI but not as bad as when she had them done here.  She has 6 month follow up but was instructed to call them sooner if she had any changes.  Sleep apnea Is using her CPAP as noted above, but having symptoms. Unclear if anxiety, sleep disturbance and daytime fatigue related to CPAP needing to be adjusted.   Has not had it checked in a while.  Thinks it needs to be adjusted.  Did seem to work better previously.      Patient Active Problem List   Diagnosis Date Noted  . Right ankle pain 10/21/2015  . Ankle sprain 10/21/2015  . Irritable bowel syndrome (IBS) 10/21/2015  . Elevated liver enzymes 06/10/2015  . Irregular menses 05/21/2015  . Arthritis 04/16/2015  . Clinical depression 04/16/2015  . Acid reflux 04/16/2015  . Accumulation of fluid in tissues 04/16/2015  . Blood glucose elevated 04/16/2015  . Extreme obesity ([place]) 04/16/2015  . Obstructive apnea 04/16/2015  .  Anxiety disorder 09/05/2006  . Cannot sleep 08/11/2006   Past Medical History  Diagnosis Date  . Depression   . Sleep apnea    Current Outpatient Prescriptions on File Prior to Visit  Medication Sig  . Ibuprofen 200 MG CAPS Take by mouth.  . loratadine (CLARITIN REDITABS) 10 MG dissolvable tablet Take by mouth.  . Multiple Vitamins-Minerals (WOMENS MULTIVITAMIN PLUS) TABS Take by mouth.  . Omeprazole 20 MG TBEC Take by mouth.  . Probiotic CAPS Take by mouth.  . sertraline (ZOLOFT) 50 MG tablet Take by mouth.   No current facility-administered medications on file prior to visit.    Allergies  Allergen Reactions  . Codeine Other (See Comments)  . Fish Oil Other (See Comments)    Indigestion Indigestion  . Penicillins Hives  . Sulfa Antibiotics Itching   Past Surgical History  Procedure Laterality Date  . Tonsillectomy and adenoidectomy  1972  . Thyroidectomy, partial     Social History   Social History  . Marital Status: Single    Spouse Name: N/A  . Number of Children: N/A  . Years of Education: N/A   Occupational History  . full time    Social History Main Topics  . Smoking status: Former Smoker    Quit date: 11/13/2005  . Smokeless tobacco: Never Used  . Alcohol Use: No  . Drug Use: No  . Sexual Activity: Not on file   Other Topics Concern  . Not on file   Social History Narrative   Family History  Problem Relation Age of Onset  . Depression Mother   . Arthritis Mother   . Emphysema Mother   . Congestive Heart Failure Mother   . Hyperlipidemia Father   . Depression Sister   . Bipolar disorder Brother   . Seizures Brother   . Arthritis Maternal Grandmother   . Asthma Maternal Grandmother   . Congestive Heart Failure Maternal Grandfather   . Dementia Paternal Grandfather   . Depression Sister        Review of Systems  Constitutional: Positive for irritability.  Respiratory: Positive for shortness of breath. Negative for choking and chest tightness.   Cardiovascular: Positive for leg swelling. Negative for chest pain and palpitations.  Gastrointestinal: Negative for nausea.  Musculoskeletal: Positive for joint swelling.  Neurological: Positive for numbness (fourth toe numb, burning of the whole ankle randomly). Negative for dizziness, tingling, facial asymmetry, speech difficulty and weakness.  Psychiatric/Behavioral: Positive for depression. Negative for suicidal ideas, confusion and decreased concentration. The patient is nervous/anxious and has insomnia (Uses CPAP but not sure if working as well as it used to.  ).       Objective:   BP 132/84 mmHg  Pulse 76  Temp(Src) 98.2 F (36.8 C) (Oral)  Resp 16  Wt 248 lb (112.492 kg)  Physical Exam  Constitutional: She is oriented to person, place, and time. She appears well-developed and well-nourished.  Cardiovascular: Normal rate and regular rhythm.   Pulmonary/Chest: Effort normal and breath sounds normal.  Musculoskeletal: She exhibits edema and tenderness.  Below lateral malleoli on right foot.  FROM. Strength intact.   Neurological: She is alert and oriented to person, place, and time.  Decreased sensation 4th digit on right foot.   Psychiatric: She has a normal mood and affect. Her behavior is normal. Judgment and thought content normal.      Assessment & Plan:     1. Obstructive apnea Not at goal.  Will refer to home titration  to see if needs to be adjusted.  - For home use only DME continuous positive airway pressure (CPAP)  2. Generalized anxiety disorder Will continue current treatment and plan of care. Awaiting GYN results.  3. Clinical depression Stable.   4. Irregular menses Being assessed by GYN.   5. Elevated liver enzymes Stable per GI.   6. Right ankle pain See below.   7. Ankle sprain, right, initial encounter Suspect cause of sprain.  Will do ROM, ABC exercises and then strengthening.   8. Need for influenza vaccination Given today.  - Flu Vaccine QUAD 36+ mos PF IM (Fluarix & Fluzone Quad PF)  9. Irritable bowel syndrome (IBS) Follow up with GI.        Margarita Rana, MD  [facility]

## 2015-10-26 ENCOUNTER — Encounter: Payer: Self-pay | Admitting: Family Medicine

## 2015-11-06 ENCOUNTER — Encounter: Payer: Self-pay | Admitting: Family Medicine

## 2015-11-06 NOTE — Telephone Encounter (Signed)
Looks like negative PAP. Please add to record. Thanks.

## 2015-11-06 NOTE — Telephone Encounter (Signed)
This has been added to pt's record.   Thanks,   -Mickel Baas

## 2015-11-14 IMAGING — US US ABDOMEN LIMITED
1 series · 14 of 25 positions shown · non-contrast
Comparison: Ultrasound [DATE]

CLINICAL DATA: Right upper quadrant abdominal pain and elevated
liver function studies. History of cholecystectomy in [DATE].

EXAM:
ULTRASOUND ABDOMEN LIMITED RIGHT UPPER QUADRANT

[Series 1: us abdomen limited · 0.25mm/px · 14 of 65 slices shown]
[im 1/65]
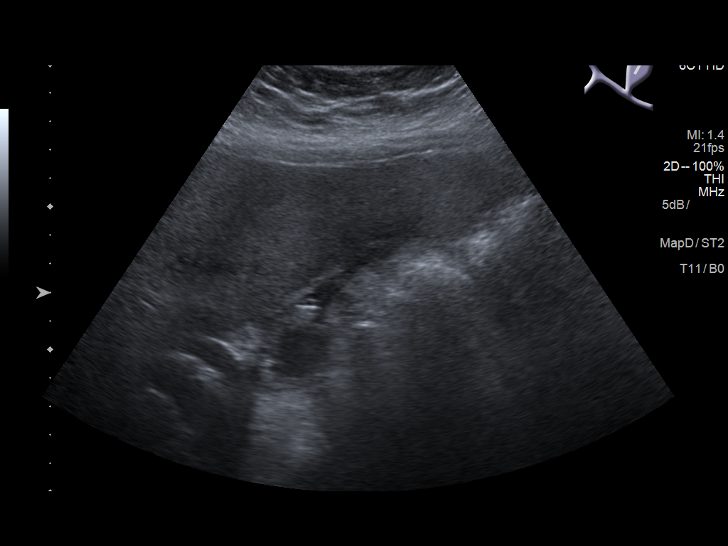
[im 6/65]
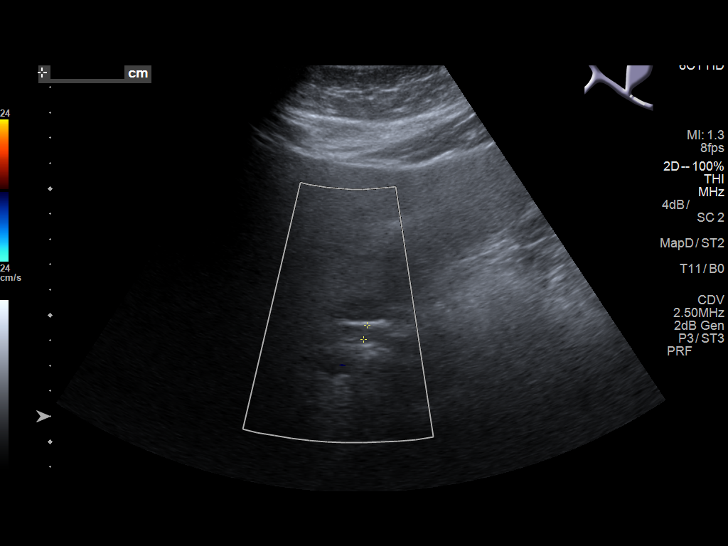
[im 11/65]
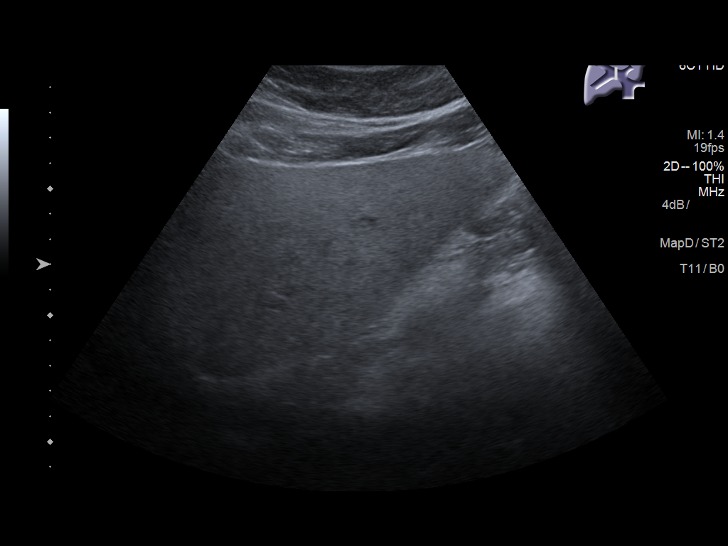
[im 17/65]
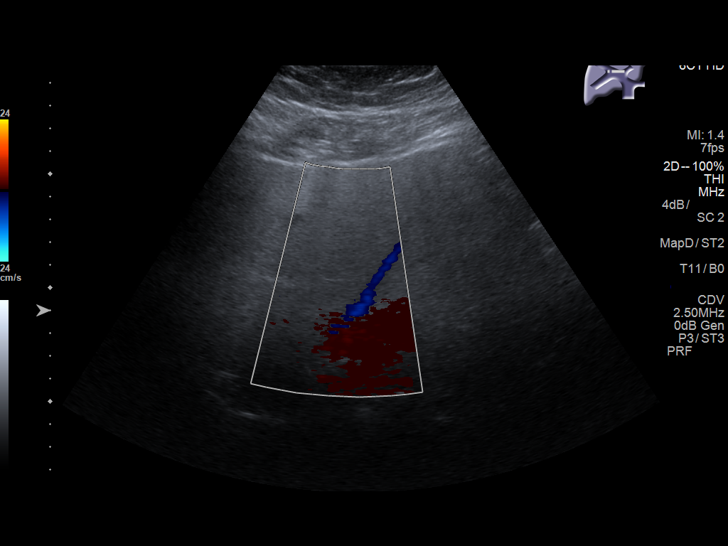
[im 22/65]
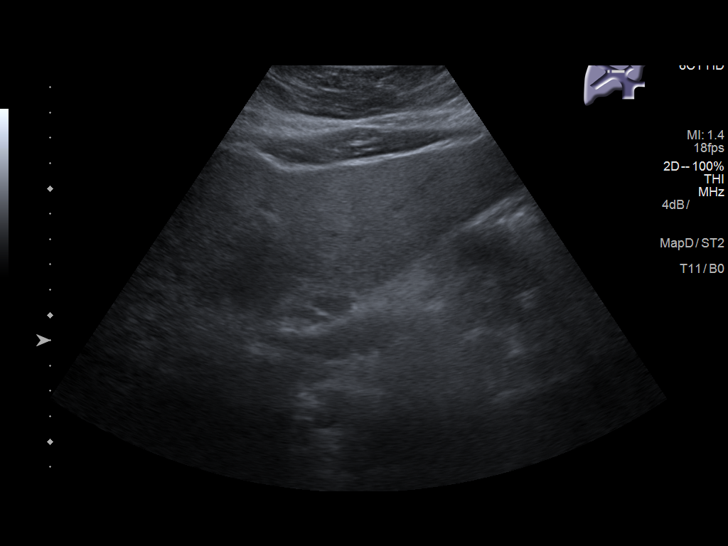
[im 25/65]
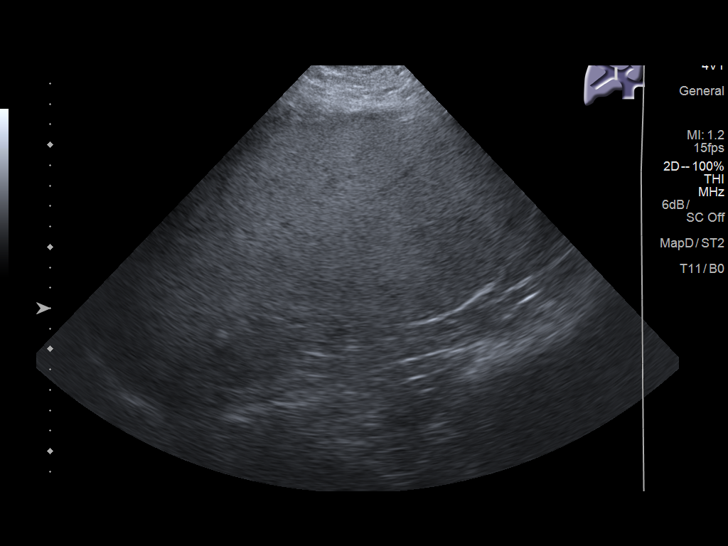
[im 30/65]
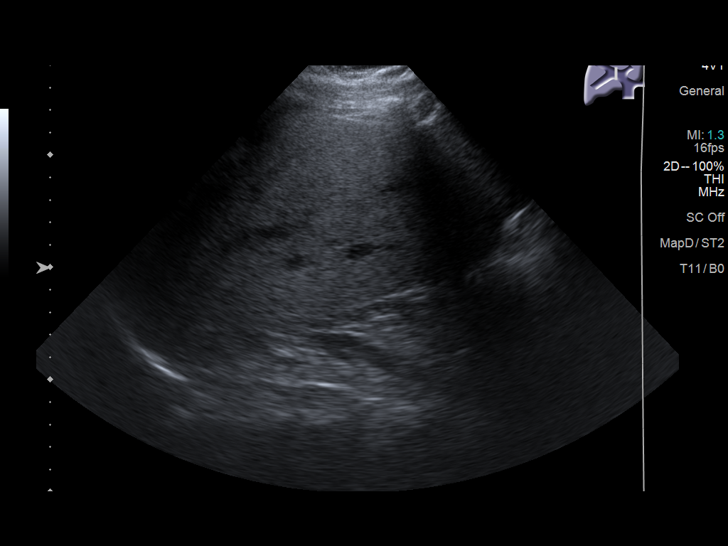
[im 35/65]
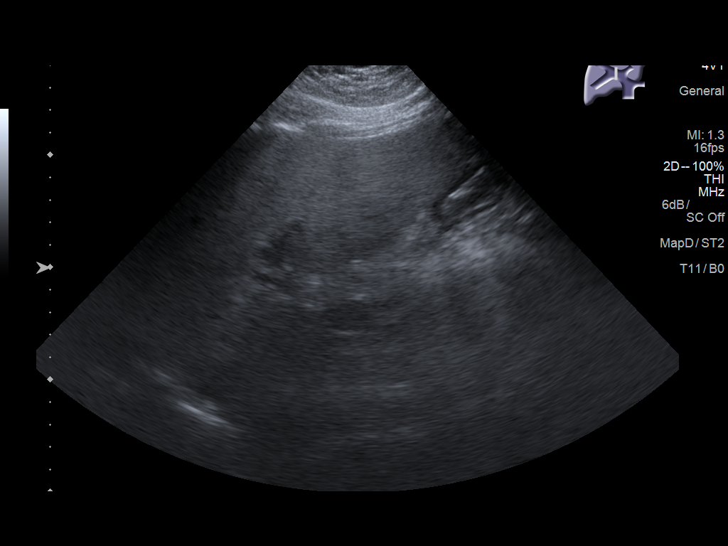
[im 41/65]
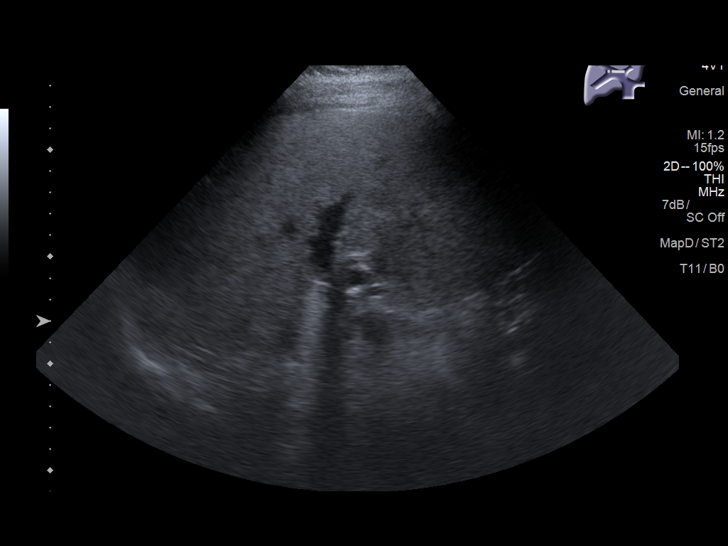
[im 43/65]
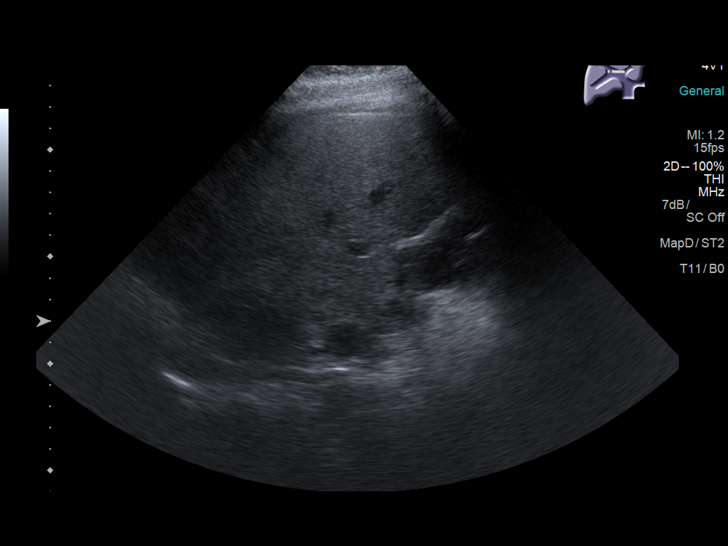
[im 49/65]
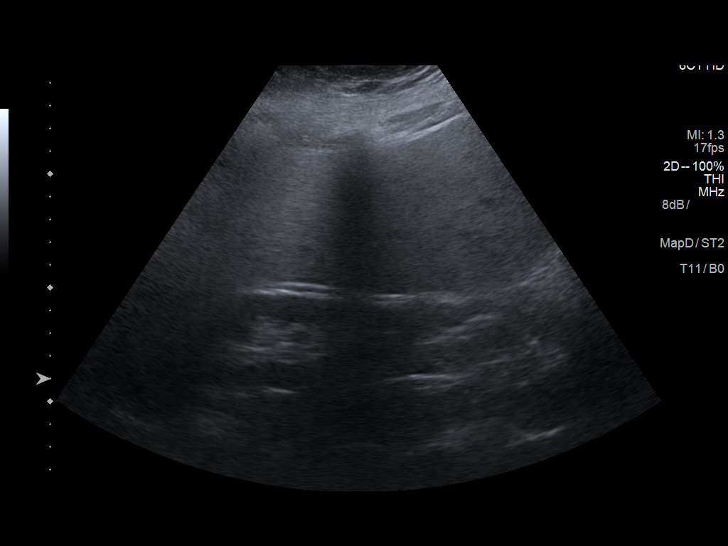
[im 54/65]
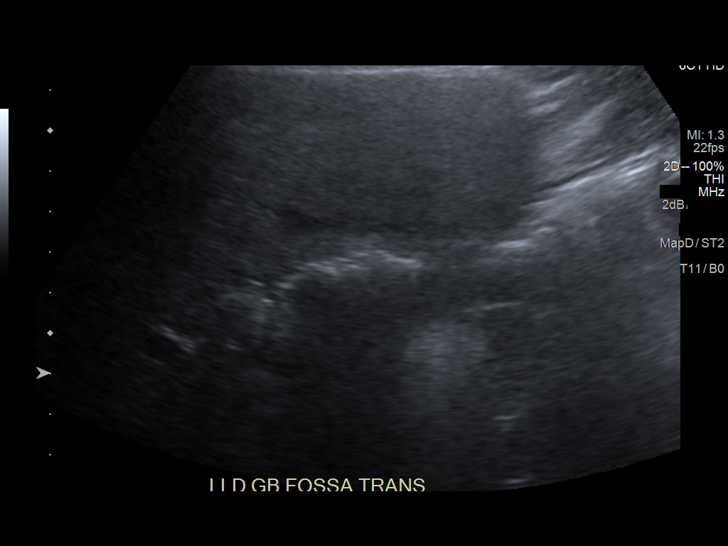
[im 59/65]
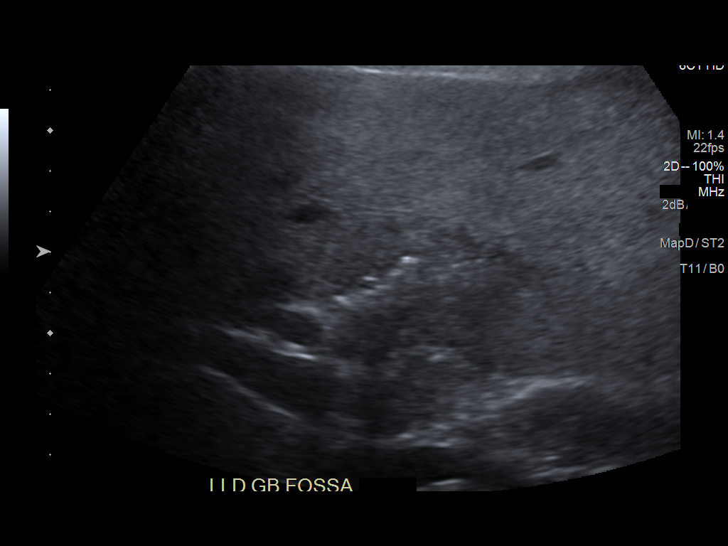
[im 65/65]
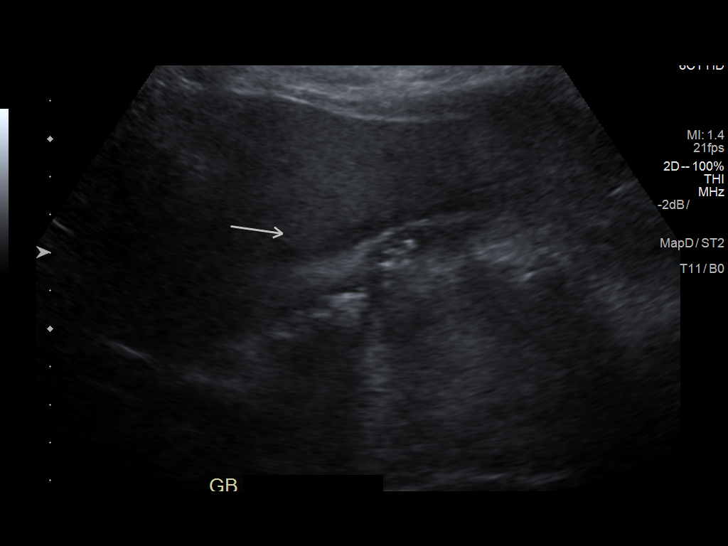

[14 of 25 positions shown; findings below may reference images not displayed]

FINDINGS: Gallbladder:

Surgically absent. No abnormal fluid collection in the gallbladder
fossa to suggest a postoperative abscess or biloma.

Common bile duct:

Diameter: 5.8 mm

Liver:

Diffuse increased echogenicity of the liver may suggest fatty
infiltration. No focal hepatic lesions or intrahepatic biliary
dilatation. Portal vein is patent on color Doppler imaging with
normal direction of blood flow towards the liver.
IMPRESSION: Status post cholecystectomy. No findings suspicious for a
postoperative abscess or biloma.

Normal caliber common bile duct.

Probable diffuse fatty infiltration of the liver.

## 2015-12-03 ENCOUNTER — Encounter: Payer: Self-pay | Admitting: Family Medicine

## 2015-12-04 ENCOUNTER — Other Ambulatory Visit: Payer: Self-pay | Admitting: Family Medicine

## 2015-12-04 MED ORDER — SERTRALINE HCL 50 MG PO TABS: 100.0000 mg | ORAL_TABLET | Freq: Every day | ORAL | Status: DC

## 2016-01-15 ENCOUNTER — Encounter: Payer: Self-pay | Admitting: Family Medicine

## 2016-06-11 ENCOUNTER — Other Ambulatory Visit: Payer: Self-pay | Admitting: Family Medicine

## 2016-06-11 DIAGNOSIS — F411 Generalized anxiety disorder: Secondary | ICD-10-CM

## 2016-07-11 ENCOUNTER — Other Ambulatory Visit: Payer: Self-pay

## 2016-07-11 NOTE — Telephone Encounter (Signed)
Received a request from [facility] pharmacy requesting a 90 day supply on Sertraline 50 mg.

## 2016-07-14 ENCOUNTER — Other Ambulatory Visit: Payer: Self-pay | Admitting: Family Medicine

## 2016-07-15 MED ORDER — SERTRALINE HCL 100 MG PO TABS: 100.0000 mg | ORAL_TABLET | Freq: Every day | ORAL | 0 refills | Status: DC

## 2016-07-22 ENCOUNTER — Other Ambulatory Visit: Payer: Self-pay | Admitting: Family Medicine

## 2016-07-22 ENCOUNTER — Ambulatory Visit
Admission: RE | Admit: 2016-07-22 | Discharge: 2016-07-22 | Disposition: A | Discharge status: Home / Self Care | Payer: BLUE CROSS/BLUE SHIELD | Source: Ambulatory Visit | Attending: Family Medicine | Admitting: Family Medicine

## 2016-07-22 ENCOUNTER — Ambulatory Visit (INDEPENDENT_AMBULATORY_CARE_PROVIDER_SITE_OTHER): Payer: BLUE CROSS/BLUE SHIELD | Admitting: Family Medicine

## 2016-07-22 ENCOUNTER — Encounter: Payer: Self-pay | Admitting: Family Medicine

## 2016-07-22 VITALS — BP 110/70 | HR 70 | Temp 98.4°F | Resp 16 | Wt 262.0 lb

## 2016-07-22 DIAGNOSIS — M79671 Pain in right foot: Secondary | ICD-10-CM

## 2016-07-22 DIAGNOSIS — R609 Edema, unspecified: Secondary | ICD-10-CM

## 2016-07-22 DIAGNOSIS — M79672 Pain in left foot: Secondary | ICD-10-CM

## 2016-07-22 DIAGNOSIS — G4733 Obstructive sleep apnea (adult) (pediatric): Secondary | ICD-10-CM

## 2016-07-22 DIAGNOSIS — M2550 Pain in unspecified joint: Secondary | ICD-10-CM | POA: Diagnosis not present

## 2016-07-22 DIAGNOSIS — R739 Hyperglycemia, unspecified: Secondary | ICD-10-CM

## 2016-07-22 IMAGING — CR DG FOOT COMPLETE 3+V*R*
1 series · 3 of 3 positions shown · non-contrast
Comparison: None.

CLINICAL DATA: Chronic right foot pain without known injury.

EXAM:
RIGHT FOOT COMPLETE - 3+ VIEW

[Series 1: dg foot complete right · 0.14mm/px · 3 of 3 slices shown]
[im 1/3]
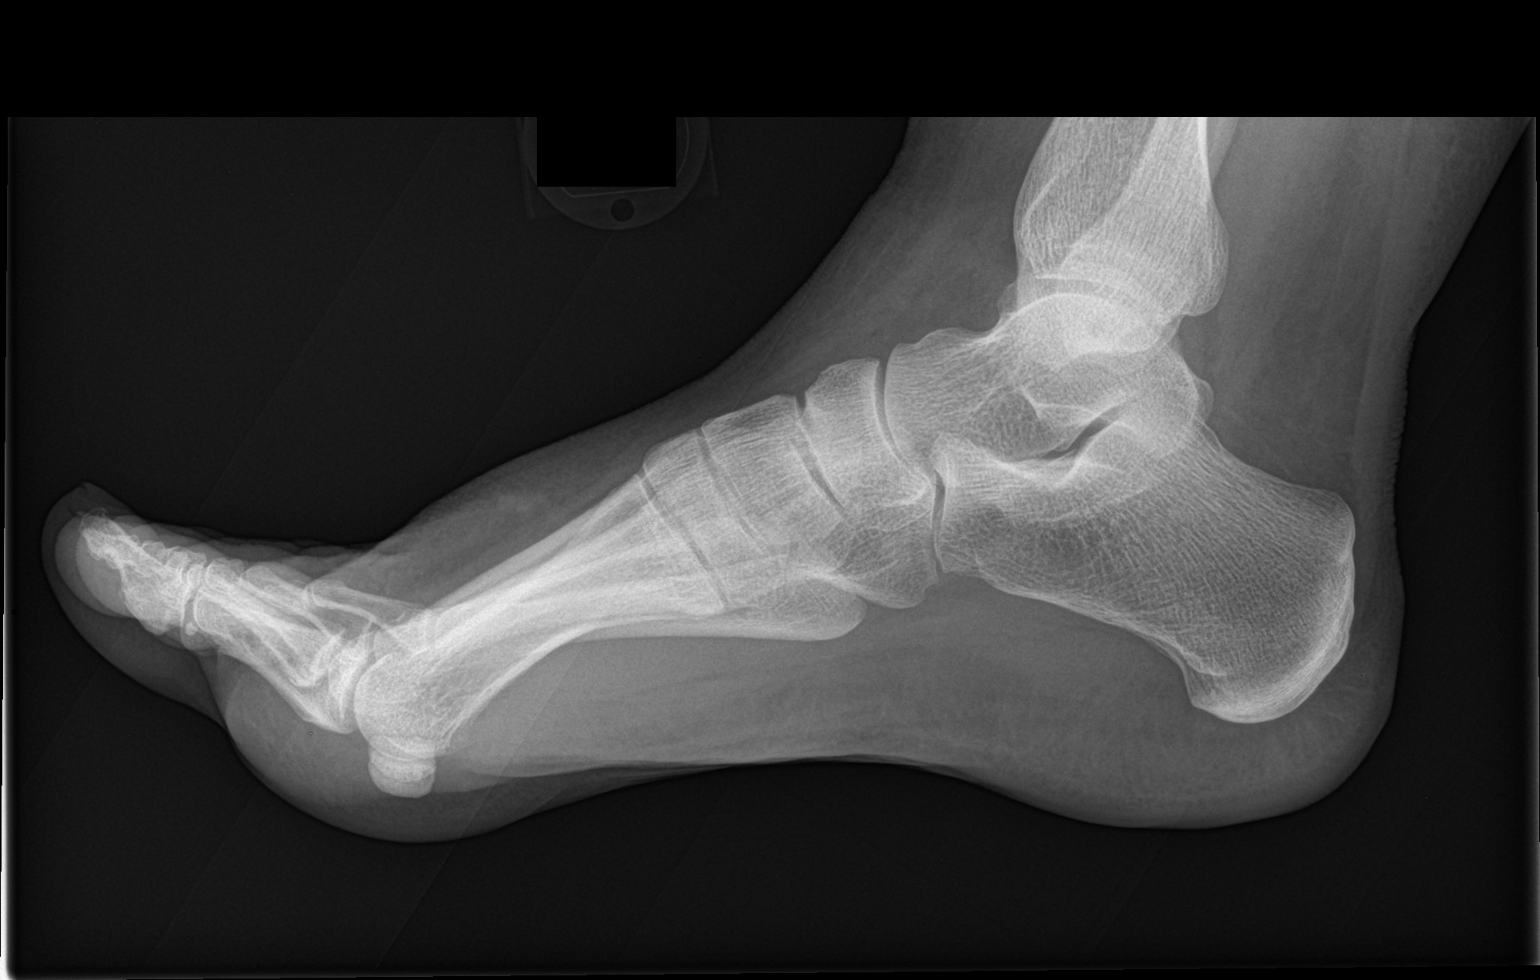
[im 2/3]
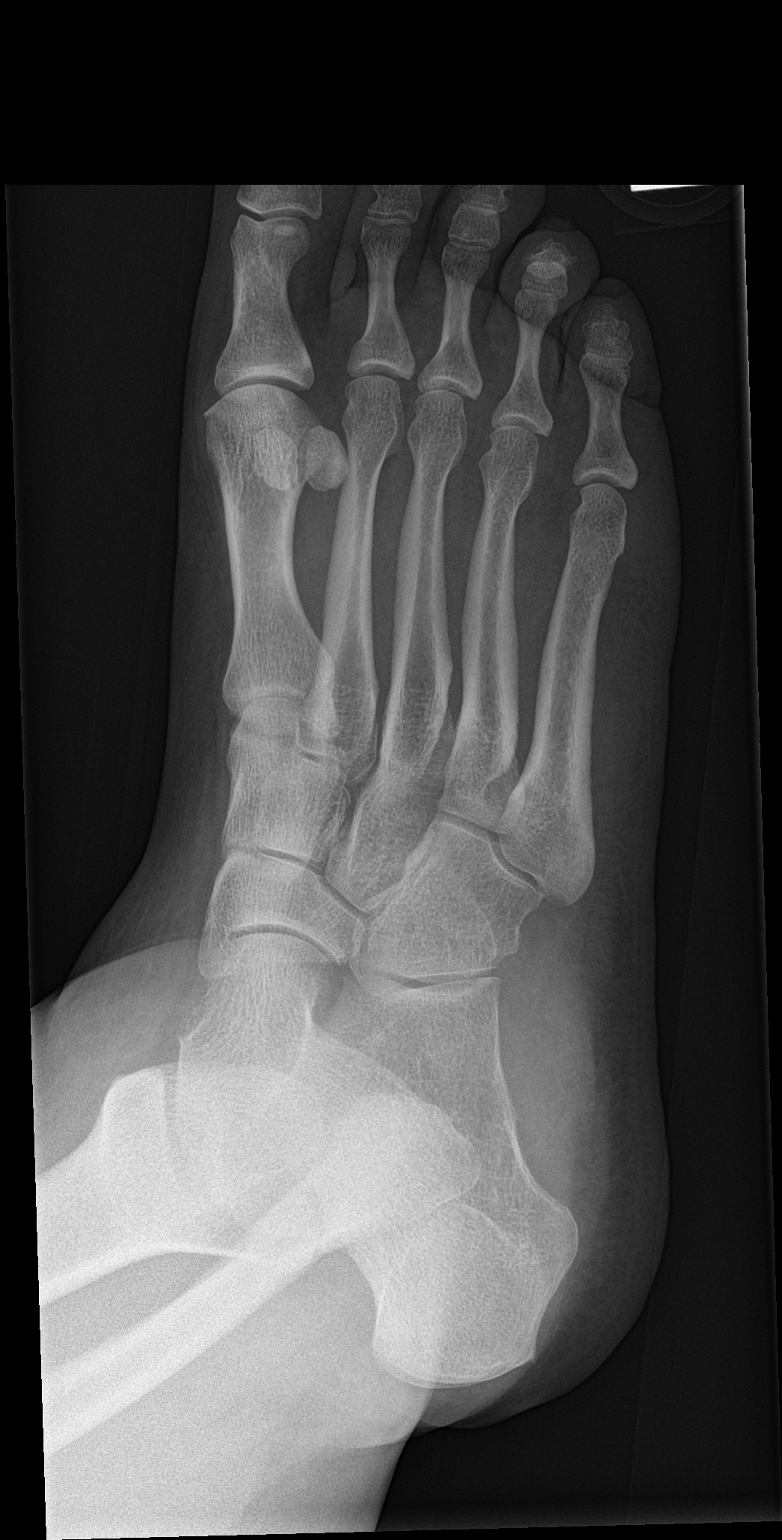
[im 3/3]
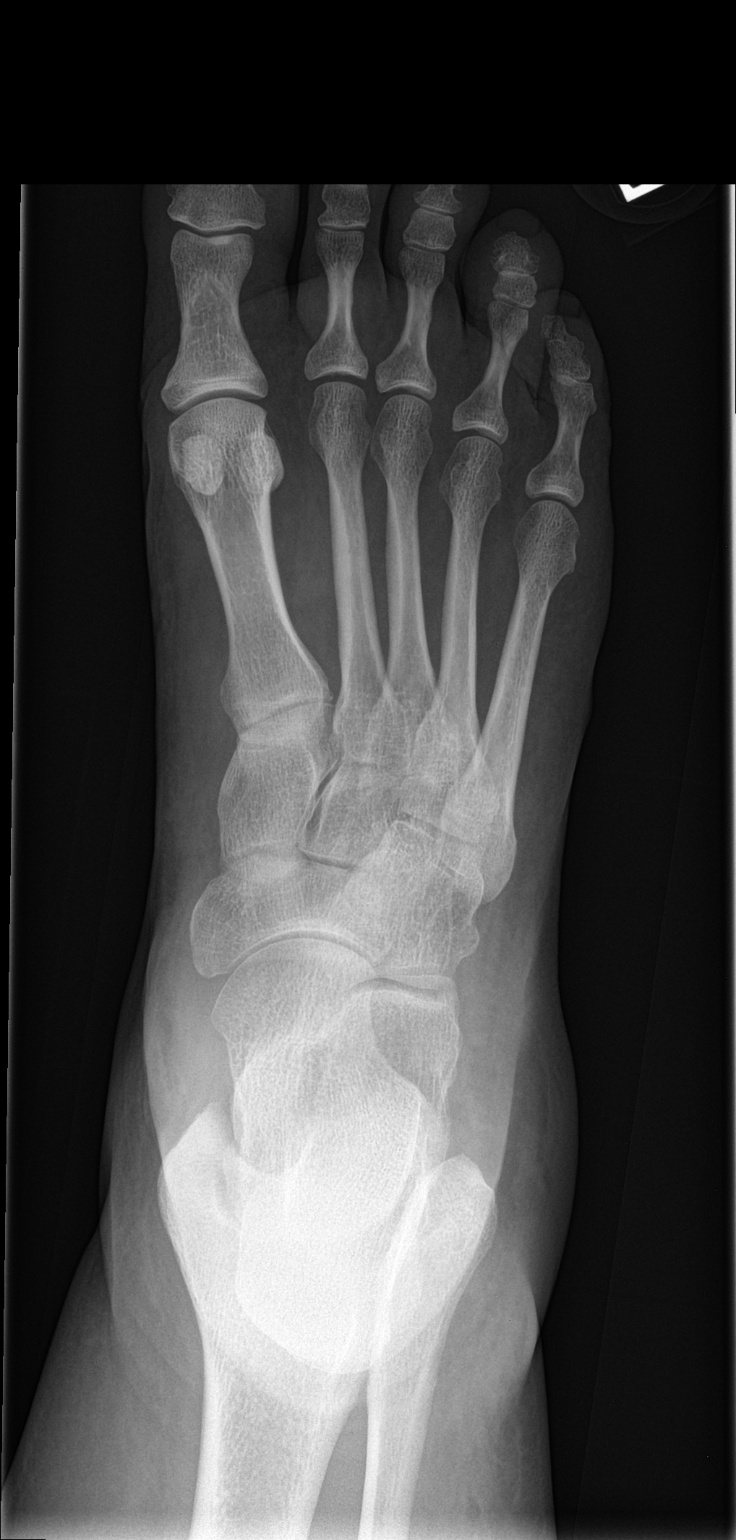

[3 of 3 positions shown; findings below may reference images not displayed]

FINDINGS: There is no evidence of fracture or dislocation. There is no
evidence of arthropathy or other focal bone abnormality. Soft
tissues are unremarkable.
IMPRESSION: Normal right foot.

## 2016-07-22 IMAGING — CR DG ANKLE COMPLETE 3+V*R*
1 series · 3 of 3 positions shown · non-contrast
Comparison: none

[Series 1: dg ankle complete right · 0.14mm/px · 3 of 3 slices shown]
[im 1/3]
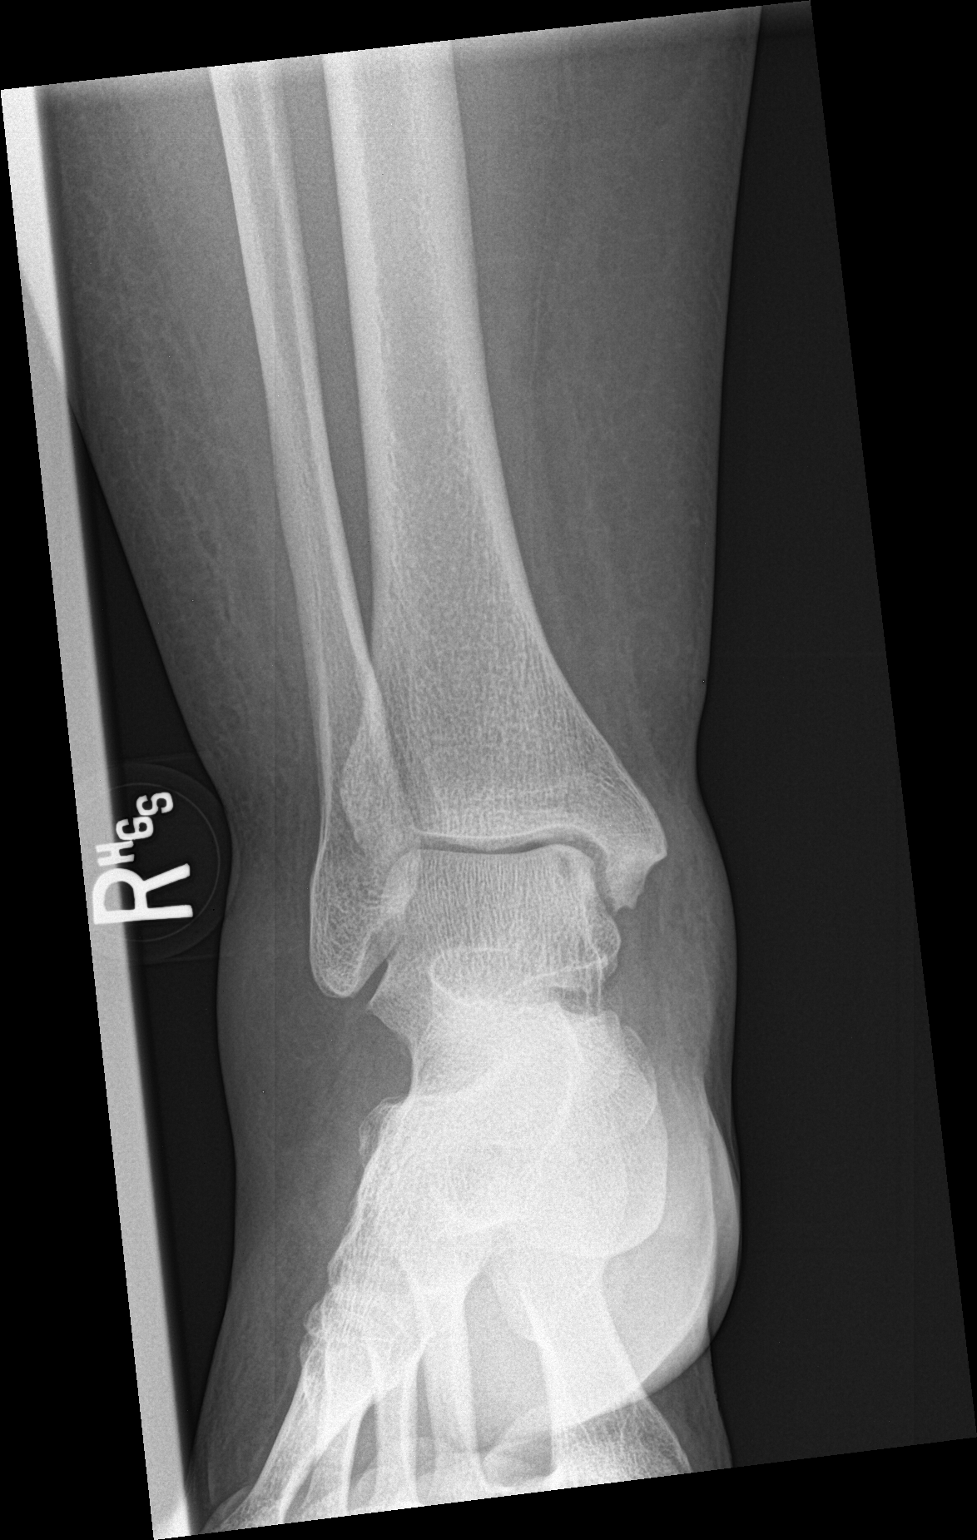
[im 2/3]
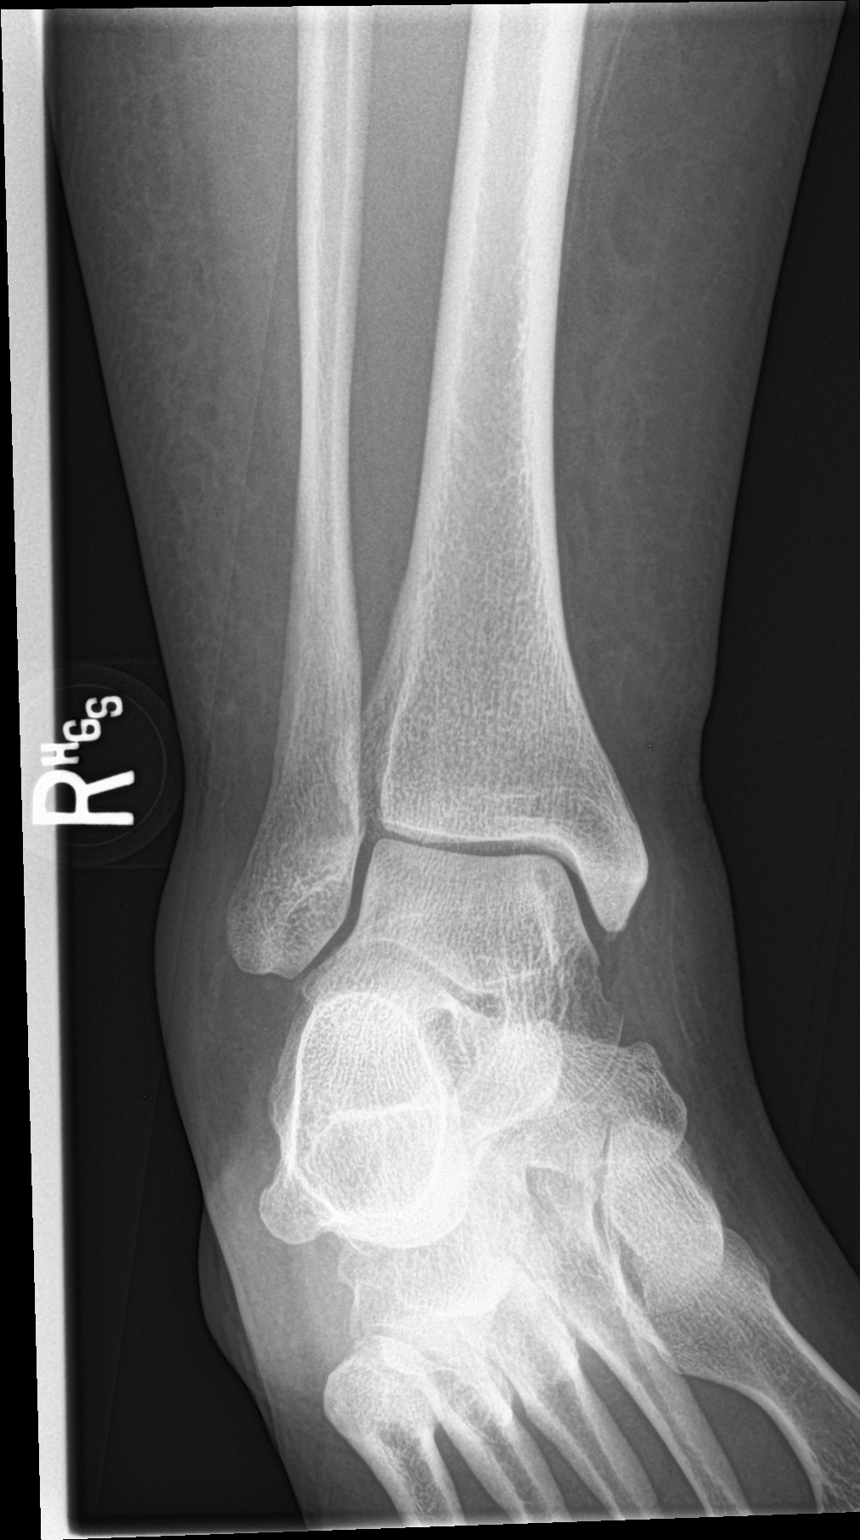
[im 3/3]
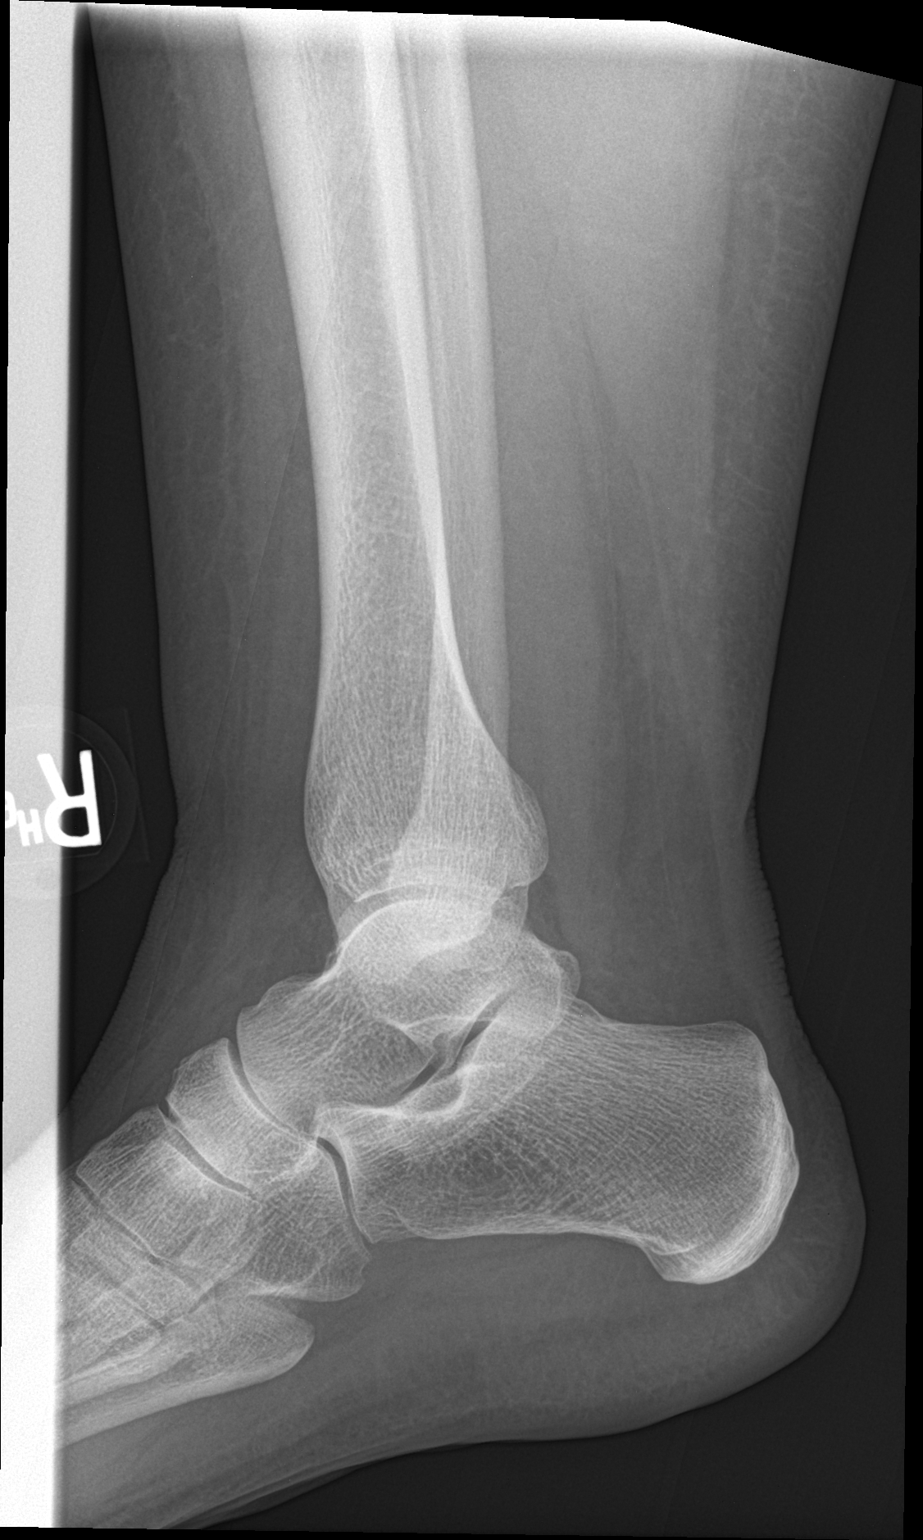

[3 of 3 positions shown; findings below may reference images not displayed]

Canned report from images found in remote index.

Refer to host system for actual result text.

## 2016-07-22 MED ORDER — SERTRALINE HCL 100 MG PO TABS: 100.0000 mg | ORAL_TABLET | Freq: Every day | ORAL | 12 refills | Status: DC

## 2016-07-22 NOTE — Progress Notes (Signed)
Patient: Sandra Wilkins Female    DOB: Jan 20, 1966   50 y.o.   MRN: [id number] Visit Date: 07/22/2016  Today's Provider: Lelon Huh, MD   Chief Complaint  Patient presents with  . Foot Pain   Subjective:     This is a previous patient of Dr. Venia Minks present today as new patient to me to establish care and follow up on chronic medical problems.   Patient has had bilateral foot and ankle pain for around a year. Patient has seen Dr. Venia Minks in the past for th foot pain however the symptoms have gotten worse. Patient takes ibuprofen occasionally with mild relief. Patient also has symptoms of dizziness and visual changes.   Foot Pain  This is a recurrent problem. The current episode started more than 1 year ago. The problem occurs intermittently. The problem has been gradually worsening. Associated symptoms include arthralgias, congestion, fatigue, headaches, joint swelling, myalgias, neck pain, numbness, urinary symptoms, vertigo and a visual change. Pertinent negatives include no abdominal pain, anorexia, change in bowel habit, chest pain, chills, coughing, diaphoresis, fever, nausea, rash, sore throat, swollen glands, vomiting or weakness. The symptoms are aggravated by standing, twisting, walking and exertion. She has tried NSAIDs for the symptoms. The treatment provided mild relief.   Feels like waling on glass, initially on her right foot, but now affecting her left. Is always accompanied by swelling in her ankles which worsens throughout the day. Also starting having calf cramps at night a few months ago. Did go on a no processed food diet 2 months ago. Ibuprofen helps, but she takes sparingly due to history of increased transaminases which may have been related to excessive NSAID use.     Allergies  Allergen Reactions  . Codeine Other (See Comments)  . Fish Oil Other (See Comments)    Indigestion Indigestion  . Penicillins Hives  . Sulfa Antibiotics Itching     Current  Outpatient Prescriptions:  .  Ibuprofen 200 MG CAPS, Take by mouth., Disp: , Rfl:  .  Liniments (DEEP BLUE RELIEF EX), Apply topically., Disp: , Rfl:  .  loratadine (CLARITIN REDITABS) 10 MG dissolvable tablet, Take by mouth., Disp: , Rfl:  .  Multiple Vitamins-Minerals (WOMENS MULTIVITAMIN PLUS) TABS, Take by mouth., Disp: , Rfl:  .  Omega-3 Fatty Acids (FISH OIL PO), Take by mouth., Disp: , Rfl:  .  Omeprazole 20 MG TBEC, Take by mouth., Disp: , Rfl:  .  Probiotic CAPS, Take by mouth., Disp: , Rfl:  .  sertraline (ZOLOFT) 100 MG tablet, Take 1 tablet (100 mg total) by mouth daily., Disp: 30 tablet, Rfl: 0  Review of Systems  Constitutional: Positive for fatigue. Negative for appetite change, chills, diaphoresis and fever.  HENT: Positive for congestion. Negative for sore throat.   Respiratory: Negative for cough, chest tightness and shortness of breath.   Cardiovascular: Negative for chest pain and palpitations.  Gastrointestinal: Negative for abdominal pain, anorexia, change in bowel habit, nausea and vomiting.  Musculoskeletal: Positive for arthralgias, joint swelling, myalgias and neck pain.  Skin: Negative for rash.  Neurological: Positive for dizziness, vertigo, numbness and headaches. Negative for weakness.    Social History  Substance Use Topics  . Smoking status: Former Smoker    Quit date: 11/13/2005  . Smokeless tobacco: Never Used  . Alcohol use No   Objective:   BP 110/70 (BP Location: Right Arm, Patient Position: Sitting, Cuff Size: Large)   Pulse 70   Temp  98.4 F (36.9 C) (Oral)   Resp 16   Wt 262 lb (118.8 kg)   SpO2 95%   BMI 46.41 kg/m   Physical Exam  General appearance: alert, well developed, well nourished, cooperative and in no distress Head: Normocephalic, without obvious abnormality, atraumatic Respiratory: Respirations even and unlabored, normal respiratory rate Extremities: Tender plantar aspect both feet . 2+ bilateral ankle edema. Skin: Skin  color, texture, turgor normal. No rashes seen  Psych: Appropriate mood and affect. Neurologic: Mental status: Alert, oriented to person, place, and time, thought content appropriate.     Assessment & Plan:     1. Foot pain, bilateral  - DG Foot Complete Right; Future - DG Ankle Complete Right; Future  2. Accumulation of fluid in tissues  - CBC - Comprehensive metabolic panel - T4 AND TSH - DG Ankle Complete Right; Future  3. Arthralgia  - CBC - Uric acid - Sed Rate (ESR)  4. Hyperglycemia Lab Results  Component Value Date   HGBA1C 6.0 04/15/2015    - Hemoglobin A1c  5. Obstructive apnea States she is using CPAP consistently every night.   She anticipates getting flu shot at work.      The entirety of the information documented in the History of Present Illness, Review of Systems and Physical Exam were personally obtained by me. Portions of this information were initially documented by April M. Sabra Heck, CMA and reviewed by me for thoroughness and accuracy.     Lelon Huh, MD  [facility]

## 2016-07-22 NOTE — Patient Instructions (Signed)
Go to the [facility] on [facility] for right foot and ankle Xray

## 2016-07-23 ENCOUNTER — Encounter: Payer: Self-pay | Admitting: Family Medicine

## 2016-07-23 ENCOUNTER — Telehealth: Payer: Self-pay

## 2016-07-23 DIAGNOSIS — E790 Hyperuricemia without signs of inflammatory arthritis and tophaceous disease: Secondary | ICD-10-CM

## 2016-07-23 LAB — SEDIMENTATION RATE: SED RATE: 7 mm/h (ref 0–32)

## 2016-07-23 LAB — CBC
HEMOGLOBIN: 14.6 g/dL (ref 11.1–15.9)
Hematocrit: 44.2 % (ref 34.0–46.6)
MCH: 28.8 pg (ref 26.6–33.0)
MCHC: 33 g/dL (ref 31.5–35.7)
MCV: 87 fL (ref 79–97)
Platelets: 270 10*3/uL (ref 150–379)
RBC: 5.07 x10E6/uL (ref 3.77–5.28)
RDW: 14.3 % (ref 12.3–15.4)
WBC: 6.9 10*3/uL (ref 3.4–10.8)

## 2016-07-23 LAB — COMPREHENSIVE METABOLIC PANEL
A/G RATIO: 1.6 (ref 1.2–2.2)
ALBUMIN: 4.2 g/dL (ref 3.5–5.5)
ALK PHOS: 117 IU/L (ref 39–117)
ALT: 49 IU/L — ABNORMAL HIGH (ref 0–32)
AST: 27 IU/L (ref 0–40)
BILIRUBIN TOTAL: 0.3 mg/dL (ref 0.0–1.2)
BUN / CREAT RATIO: 19 (ref 9–23)
BUN: 19 mg/dL (ref 6–24)
CHLORIDE: 98 mmol/L (ref 96–106)
CO2: 28 mmol/L (ref 18–29)
Calcium: 9.3 mg/dL (ref 8.7–10.2)
Creatinine, Ser: 1 mg/dL (ref 0.57–1.00)
GFR calc non Af Amer: 67 mL/min/{1.73_m2} (ref >59)
GFR, EST AFRICAN AMERICAN: 77 mL/min/{1.73_m2} (ref >59)
GLOBULIN, TOTAL: 2.7 g/dL (ref 1.5–4.5)
GLUCOSE: 96 mg/dL (ref 65–99)
POTASSIUM: 4.7 mmol/L (ref 3.5–5.2)
SODIUM: 140 mmol/L (ref 134–144)
TOTAL PROTEIN: 6.9 g/dL (ref 6.0–8.5)

## 2016-07-23 LAB — T4 AND TSH
T4, Total: 6.3 ug/dL (ref 4.5–12.0)
TSH: 2.94 u[IU]/mL (ref 0.450–4.500)

## 2016-07-23 LAB — URIC ACID: Uric Acid: 8 mg/dL — ABNORMAL HIGH (ref 2.5–7.1)

## 2016-07-23 LAB — HEMOGLOBIN A1C
Est. average glucose Bld gHb Est-mCnc: 117 mg/dL
Hgb A1c MFr Bld: 5.7 % — ABNORMAL HIGH (ref 4.8–5.6)

## 2016-07-23 MED ORDER — COLCHICINE 0.6 MG PO TABS: 0.6000 mg | ORAL_TABLET | Freq: Every day | ORAL | 0 refills | Status: DC

## 2016-07-23 NOTE — Telephone Encounter (Signed)
-----   Message from Birdie Sons, MD sent at 07/23/2016 10:01 AM EDT ----- Patient has elevated Uric Acid indicating she may have gout causing her foot pain and swelling. Need to start H[place]ira 0.5mg  one tablet daily, #30 No refill. Follow up office visit in 1 month. Xray of foot is completely normal.

## 2016-07-23 NOTE — Telephone Encounter (Signed)
Patient advised as directed below. Verbalized understanding. Prescription for Mitigare 0.6 MG one tablet daily QTY#30 No refill phoned to [place].  Thanks,  -Ceanna Wareing

## 2016-07-27 ENCOUNTER — Encounter: Payer: Self-pay | Admitting: Family Medicine

## 2016-08-17 ENCOUNTER — Ambulatory Visit (INDEPENDENT_AMBULATORY_CARE_PROVIDER_SITE_OTHER): Payer: BLUE CROSS/BLUE SHIELD | Admitting: Family Medicine

## 2016-08-17 ENCOUNTER — Encounter: Payer: Self-pay | Admitting: Family Medicine

## 2016-08-17 VITALS — BP 122/80 | HR 71 | Temp 97.8°F | Resp 16 | Wt 267.0 lb

## 2016-08-17 DIAGNOSIS — M79672 Pain in left foot: Secondary | ICD-10-CM

## 2016-08-17 DIAGNOSIS — R609 Edema, unspecified: Secondary | ICD-10-CM

## 2016-08-17 DIAGNOSIS — M79671 Pain in right foot: Secondary | ICD-10-CM

## 2016-08-17 MED ORDER — FUROSEMIDE 20 MG PO TABS: 20.0000 mg | ORAL_TABLET | Freq: Every day | ORAL | 3 refills | Status: DC

## 2016-08-17 NOTE — Progress Notes (Signed)
Patient: Sandra Wilkins Female    DOB: 14-Sep-1966   50 y.o.   MRN: [id number] Visit Date: 08/17/2016  Today's Provider: Lelon Huh, MD   Chief Complaint  Patient presents with  . Foot Pain  . Ankle Pain   Subjective:    HPI Follow up Foot and ankle pain :  Patient was last seen 07/22/2016 for bilateral foot an ankle pain. She had extensive rheumatological labs and normal  x rays were obtained showing elevated Uric Acid =8.0 Patient was started on  Mitagare 0.5mg  one tablet daily. Patient was advised to follow up in 1 month. Xray of foot was completely normal. Patient come in today reporting good compliance with treatment, good tolerance and poor symptom control. Patient states she still has swelling an pain in both ankles. Patient also states that the medication that was prescribed is too expensive for her.     Allergies  Allergen Reactions  . Codeine Other (See Comments)  . Fish Oil Other (See Comments)    Indigestion Indigestion  . Penicillins Hives  . Sulfa Antibiotics Itching     Current Outpatient Prescriptions:  .  colchicine 0.6 MG tablet, Take 1 tablet (0.6 mg total) by mouth daily., Disp: 30 tablet, Rfl: 0 .  Ibuprofen 200 MG CAPS, Take by mouth., Disp: , Rfl:  .  Liniments (DEEP BLUE RELIEF EX), Apply topically., Disp: , Rfl:  .  loratadine (CLARITIN REDITABS) 10 MG dissolvable tablet, Take by mouth., Disp: , Rfl:  .  Misc Natural Products (DETOX PO), Take by mouth. Bio-Cleanse supplement. Takes 2 capsules daily, Disp: , Rfl:  .  Multiple Vitamins-Minerals (WOMENS MULTIVITAMIN PLUS) TABS, Take by mouth., Disp: , Rfl:  .  Omega-3 Fatty Acids (FISH OIL PO), Take by mouth., Disp: , Rfl:  .  Omeprazole 20 MG TBEC, Take by mouth., Disp: , Rfl:  .  Probiotic CAPS, Take by mouth., Disp: , Rfl:  .  sertraline (ZOLOFT) 100 MG tablet, Take 1 tablet (100 mg total) by mouth daily., Disp: 30 tablet, Rfl: 12  Review of Systems  Constitutional: Negative for appetite  change, chills, fatigue and fever.  Respiratory: Negative for chest tightness and shortness of breath.   Cardiovascular: Negative for chest pain and palpitations.  Gastrointestinal: Negative for abdominal pain, nausea and vomiting.  Musculoskeletal: Positive for arthralgias and joint swelling.  Neurological: Negative for dizziness and weakness.    Social History  Substance Use Topics  . Smoking status: Former Smoker    Quit date: 11/13/2005  . Smokeless tobacco: Never Used  . Alcohol use No   Objective:   BP 122/80 (BP Location: Left Arm, Patient Position: Sitting, Cuff Size: Large)   Pulse 71   Temp 97.8 F (36.6 C) (Oral)   Resp 16   Wt 267 lb (121.1 kg)   SpO2 95% Comment: room air  BMI 47.30 kg/m   Wt Readings from Last 3 Encounters:  08/17/16 267 lb (121.1 kg)  07/22/16 262 lb (118.8 kg)  10/21/15 248 lb (112.5 kg)    Physical Exam  General appearance: alert, well developed, well nourished, cooperative and in no distress Head: Normocephalic, without obvious abnormality, atraumatic Respiratory: Respirations even and unlabored, normal respiratory rate Extremities: 3+ bilateral ankle edema and mild edema extending to lower legs.  Skin: Skin color, texture, turgor normal. No rashes seen  Psych: Appropriate mood and affect. Neurologic: Mental status: Alert, oriented to person, place, and time, thought content appropriate.  Assessment & Plan:      1. Edema, unspecified type  - furosemide (LASIX) 20 MG tablet; Take 1 tablet (20 mg total) by mouth daily.  Dispense: 30 tablet; Refill: 3  2. Foot and ankle pain Although she has elevated uric acid, there has been no improvement on colchicine. Pain may be secondary to chronic lymphedema. Will start wearing compression stockings and start furosemide as above. If not improving within 2 weeks she will call for referral to orthopedics.       Lelon Huh, MD  [facility]

## 2016-08-22 ENCOUNTER — Encounter: Payer: Self-pay | Admitting: Family Medicine

## 2016-08-22 DIAGNOSIS — M5030 Other cervical disc degeneration, unspecified cervical region: Secondary | ICD-10-CM | POA: Insufficient documentation

## 2016-09-17 ENCOUNTER — Encounter: Payer: Self-pay | Admitting: Family Medicine

## 2016-09-19 ENCOUNTER — Other Ambulatory Visit: Payer: Self-pay | Admitting: *Deleted

## 2016-09-19 MED ORDER — OMEPRAZOLE 20 MG PO TBEC: 20.0000 mg | DELAYED_RELEASE_TABLET | Freq: Every day | ORAL | 5 refills | Status: DC

## 2017-01-11 ENCOUNTER — Encounter: Payer: Self-pay | Admitting: Family Medicine

## 2017-01-11 ENCOUNTER — Ambulatory Visit (INDEPENDENT_AMBULATORY_CARE_PROVIDER_SITE_OTHER): Payer: BLUE CROSS/BLUE SHIELD | Admitting: Family Medicine

## 2017-01-11 VITALS — BP 134/72 | HR 72 | Temp 98.6°F | Resp 16 | Wt 257.0 lb

## 2017-01-11 DIAGNOSIS — F410 Panic disorder [episodic paroxysmal anxiety] without agoraphobia: Secondary | ICD-10-CM | POA: Diagnosis not present

## 2017-01-11 MED ORDER — ALPRAZOLAM 0.5 MG PO TABS: 0.2500 mg | ORAL_TABLET | ORAL | 0 refills | Status: DC | PRN

## 2017-01-11 NOTE — Progress Notes (Signed)
Patient: Sandra Wilkins Female    DOB: [birth year]   51 y.o.   MRN: [id number] Visit Date: 01/11/2017  Today's Provider: Lelon Huh, MD   Chief Complaint  Patient presents with  . Chest Pain   Subjective:    Chest Pain   This is a new problem. The problem occurs daily. The problem has been waxing and waning. The pain is mild. The quality of the pain is described as tightness. The pain does not radiate. Associated symptoms include dizziness. The pain is aggravated by emotional upset (patient reports that she has a stressful job). She has tried rest for the symptoms. Risk factors include stress.  Her past medical history is significant for anxiety/panic attacks.  Symptoms only occur in context of stressful confrontations at work. No related to physical exertion.      Allergies  Allergen Reactions  . Codeine Other (See Comments)  . Fish Oil Other (See Comments)    Indigestion Indigestion  . Penicillins Hives  . Sulfa Antibiotics Itching     Current Outpatient Prescriptions:  .  furosemide (LASIX) 20 MG tablet, Take 1 tablet (20 mg total) by mouth daily., Disp: 30 tablet, Rfl: 3 .  Ibuprofen 200 MG CAPS, Take by mouth., Disp: , Rfl:  .  Liniments (DEEP BLUE RELIEF EX), Apply topically., Disp: , Rfl:  .  loratadine (CLARITIN REDITABS) 10 MG dissolvable tablet, Take by mouth., Disp: , Rfl:  .  Misc Natural Products (DETOX PO), Take by mouth. Bio-Cleanse supplement. Takes 2 capsules daily, Disp: , Rfl:  .  Multiple Vitamins-Minerals (WOMENS MULTIVITAMIN PLUS) TABS, Take by mouth., Disp: , Rfl:  .  Omega-3 Fatty Acids (FISH OIL PO), Take by mouth., Disp: , Rfl:  .  Omeprazole 20 MG TBEC, Take 1 tablet (20 mg total) by mouth daily., Disp: 30 each, Rfl: 5 .  Probiotic CAPS, Take by mouth., Disp: , Rfl:  .  sertraline (ZOLOFT) 100 MG tablet, Take 1 tablet (100 mg total) by mouth daily., Disp: 30 tablet, Rfl: 12 .  colchicine 0.6 MG tablet, Take 1 tablet (0.6 mg total) by mouth  daily., Disp: 30 tablet, Rfl: 0  Review of Systems  Constitutional: Positive for activity change and fatigue.  Cardiovascular: Positive for chest pain.  Neurological: Positive for dizziness.    Social History  Substance Use Topics  . Smoking status: Former Smoker    Quit date: 11/13/2005  . Smokeless tobacco: Never Used  . Alcohol use No   Objective:   BP 134/72 (BP Location: Left Arm, Patient Position: Sitting, Cuff Size: Large)   Pulse 72   Temp 98.6 F (37 C)   Resp 16   Wt 257 lb (116.6 kg)   BMI 45.53 kg/m  Vitals:   01/11/17 1441  BP: 134/72  Pulse: 72  Resp: 16  Temp: 98.6 F (37 C)  Weight: 257 lb (116.6 kg)     Physical Exam   General Appearance:    Alert, cooperative, no distress  Eyes:    PERRL, conjunctiva/corneas clear, EOM's intact       Lungs:     Clear to auscultation bilaterally, respirations unlabored  Heart:    Regular rate and rhythm  Neurologic:   Awake, alert, oriented x 3. No apparent focal neurological           defect.           Assessment & Plan:     1. Panic attacks She is to  titrate sertraline up from 100mg  a day to 100mg  twice a day and may take - ALPRAZolam (XANAX) 0.5 MG tablet; Take 0.5-2 tablets (0.25-1 mg total) by mouth every 4 (four) hours as needed (panic attacks).  Dispense: 30 tablet; Refill: 0  Return in about 3 weeks (around 02/01/2017).        Lelon Huh, MD  [facility]

## 2017-01-11 NOTE — Patient Instructions (Signed)
Increase sertraline to 1 1/2 tablets a day for a week, then increase to 2 tablets daily.

## 2017-01-14 ENCOUNTER — Encounter: Payer: Self-pay | Admitting: Family Medicine

## 2017-01-27 ENCOUNTER — Encounter: Payer: Self-pay | Admitting: Family Medicine

## 2017-01-28 MED ORDER — SERTRALINE HCL 100 MG PO TABS: 100.0000 mg | ORAL_TABLET | Freq: Two times a day (BID) | ORAL | 12 refills | Status: DC

## 2017-02-01 ENCOUNTER — Ambulatory Visit (INDEPENDENT_AMBULATORY_CARE_PROVIDER_SITE_OTHER): Payer: BLUE CROSS/BLUE SHIELD | Admitting: Family Medicine

## 2017-02-01 ENCOUNTER — Encounter: Payer: Self-pay | Admitting: Family Medicine

## 2017-02-01 VITALS — BP 142/88 | HR 79 | Temp 97.8°F | Resp 16 | Wt 261.0 lb

## 2017-02-01 DIAGNOSIS — F410 Panic disorder [episodic paroxysmal anxiety] without agoraphobia: Secondary | ICD-10-CM | POA: Diagnosis not present

## 2017-02-01 NOTE — Progress Notes (Signed)
Patient: Sandra Wilkins Female    DOB: [birth year]   51 y.o.   MRN: [id number] Visit Date: 02/01/2017  Today's Provider: Lelon Huh, MD   Chief Complaint  Patient presents with  . Panic Attack    follow up   Subjective:    HPI Follow up of Panic attacks:  Patient was last seen for this problem 4 weeks ago. Changes made during that visit includes increasing Sertraline to 1.5 tablets a day for a week, then increasing to 2 tablets daily. Patient was also advised to take Xanax as needed. Today patient comes in reporting that this problem has improved .She reports good compliance with treatment, good tolerance and good symptom control. Patient states that she takes a 1/2 tablet of Xanax when she feels that a panic attack is coming on. Patient has titrated Sertraline up to 2 tablets a day. Has only had to take alprazolam 4-5 times since last visit, but has had no panic attacks.     Allergies  Allergen Reactions  . Codeine Other (See Comments)  . Fish Oil Other (See Comments)    Indigestion Indigestion  . Penicillins Hives  . Sulfa Antibiotics Itching     Current Outpatient Prescriptions:  .  ALPRAZolam (XANAX) 0.5 MG tablet, Take 0.5-2 tablets (0.25-1 mg total) by mouth every 4 (four) hours as needed (panic attacks)., Disp: 30 tablet, Rfl: 0 .  Ibuprofen 200 MG CAPS, Take by mouth., Disp: , Rfl:  .  Liniments (DEEP BLUE RELIEF EX), Apply topically., Disp: , Rfl:  .  loratadine (CLARITIN REDITABS) 10 MG dissolvable tablet, Take by mouth., Disp: , Rfl:  .  Misc Natural Products (DETOX PO), Take by mouth. Bio-Cleanse supplement. Takes 2 capsules daily, Disp: , Rfl:  .  Multiple Vitamins-Minerals (WOMENS MULTIVITAMIN PLUS) TABS, Take by mouth., Disp: , Rfl:  .  Omega-3 Fatty Acids (FISH OIL PO), Take by mouth., Disp: , Rfl:  .  Omeprazole 20 MG TBEC, Take 1 tablet (20 mg total) by mouth daily., Disp: 30 each, Rfl: 5 .  Probiotic CAPS, Take by mouth., Disp: , Rfl:  .   sertraline (ZOLOFT) 100 MG tablet, Take 1 tablet (100 mg total) by mouth 2 (two) times daily., Disp: 60 tablet, Rfl: 12  Review of Systems  Constitutional: Positive for fatigue. Negative for appetite change, chills and fever.  Respiratory: Negative for chest tightness and shortness of breath.   Cardiovascular: Negative for chest pain and palpitations.  Gastrointestinal: Negative for abdominal pain, nausea and vomiting.  Neurological: Negative for dizziness and weakness.    Social History  Substance Use Topics  . Smoking status: Former Smoker    Quit date: 11/13/2005  . Smokeless tobacco: Never Used  . Alcohol use No   Objective:   BP (!) 142/88 (BP Location: Left Arm, Patient Position: Sitting, Cuff Size: Large)   Pulse 79   Temp 97.8 F (36.6 C) (Oral)   Resp 16   Wt 261 lb (118.4 kg)   SpO2 98% Comment: room air  BMI 46.23 kg/m  There were no vitals filed for this visit.   Physical Exam     General appearance: alert, well developed, well nourished, cooperative and in no distress Head: Normocephalic, without obvious abnormality, atraumatic Respiratory: Respirations even and unlabored, normal respiratory rate Extremities: No gross deformities Skin: Skin color, texture, turgor normal. No rashes seen  Psych: Appropriate mood and affect. Neurologic: Mental status: Alert, oriented to person, place, and time, thought content  appropriate.      Assessment & Plan:     1. Panic attacks Doing much better with increased dose of sertraline. Continue current medications.  Return in about 6 months (around 08/04/2017).       Lelon Huh, MD  [facility]

## 2017-02-28 ENCOUNTER — Other Ambulatory Visit: Payer: Self-pay | Admitting: Family Medicine

## 2017-02-28 DIAGNOSIS — F410 Panic disorder [episodic paroxysmal anxiety] without agoraphobia: Secondary | ICD-10-CM

## 2017-02-28 NOTE — Telephone Encounter (Signed)
Rx called in to pharmacy. 

## 2017-02-28 NOTE — Telephone Encounter (Signed)
Please call in alprazolam.  

## 2017-03-19 ENCOUNTER — Other Ambulatory Visit: Payer: Self-pay | Admitting: Family Medicine

## 2017-03-29 ENCOUNTER — Ambulatory Visit (INDEPENDENT_AMBULATORY_CARE_PROVIDER_SITE_OTHER): Payer: BLUE CROSS/BLUE SHIELD | Admitting: Family Medicine

## 2017-03-29 ENCOUNTER — Encounter: Payer: Self-pay | Admitting: Family Medicine

## 2017-03-29 VITALS — BP 120/70 | Temp 98.3°F | Resp 16 | Wt 260.0 lb

## 2017-03-29 DIAGNOSIS — R1084 Generalized abdominal pain: Secondary | ICD-10-CM

## 2017-03-29 DIAGNOSIS — R197 Diarrhea, unspecified: Secondary | ICD-10-CM | POA: Diagnosis not present

## 2017-03-29 DIAGNOSIS — R109 Unspecified abdominal pain: Secondary | ICD-10-CM | POA: Diagnosis not present

## 2017-03-29 LAB — POCT URINALYSIS DIPSTICK
Bilirubin, UA: NEGATIVE
GLUCOSE UA: NEGATIVE
Ketones, UA: NEGATIVE
LEUKOCYTES UA: NEGATIVE
NITRITE UA: NEGATIVE
PH UA: 6 (ref 5.0–8.0)
Spec Grav, UA: 1.015 (ref 1.010–1.025)
UROBILINOGEN UA: 0.2 U/dL

## 2017-03-29 MED ORDER — METRONIDAZOLE 500 MG PO TABS: 500.0000 mg | ORAL_TABLET | Freq: Three times a day (TID) | ORAL | 0 refills | Status: DC

## 2017-03-29 MED ORDER — CIPROFLOXACIN HCL 500 MG PO TABS: 500.0000 mg | ORAL_TABLET | Freq: Two times a day (BID) | ORAL | 0 refills | Status: AC

## 2017-03-29 NOTE — Progress Notes (Signed)
Patient: Sandra Wilkins Female    DOB: 05/08/1966   51 y.o.   MRN: [id number] Visit Date: 03/29/2017  Today's Provider: Lelon Huh, MD   Chief Complaint  Patient presents with  . Abdominal Pain   Subjective:    Patient stated that she started having abdominal pain 4 days ago. Patient stated that (ain came on quickly. Patient has symptoms of flatus, belching, diarrhea, nausea, vomiting, and urine frequency. Pain does radiates to flanks and back. Patient has been taking otc ibuprofen with mild relief.    Abdominal Pain  This is a new problem. The current episode started in the past 7 days. The problem occurs constantly. The problem has been gradually worsening. The pain is located in the generalized abdominal region, right flank, left flank, LLQ, RLQ and RUQ. The pain is at a severity of 3/10. The pain is moderate. The quality of the pain is aching, dull and cramping. The abdominal pain radiates to the back, left flank and right flank. Associated symptoms include belching, diarrhea, a fever, flatus, frequency, headaches, myalgias, nausea and vomiting. Pertinent negatives include no anorexia, arthralgias, constipation, dysuria, hematochezia, hematuria, melena or weight loss. The pain is aggravated by eating, movement, palpation, certain positions and deep breathing. The pain is relieved by being still and sitting up. She has tried proton pump inhibitors (ibuprofen) for the symptoms. The treatment provided mild relief.   Feels more thirsty. Pain worse is worse after eating. Pain started in epigastrium and spread around both sides. Having watery diarrhea several times a day. No blood. Persistent crampy abdominal pain.     Allergies  Allergen Reactions  . Codeine Other (See Comments)  . Fish Oil Other (See Comments)    Indigestion Indigestion  . Penicillins Hives  . Sulfa Antibiotics Itching     Current Outpatient Prescriptions:  .  ALPRAZolam (XANAX) 0.5 MG tablet, TAKE 1/2-2  TABLETS BY MOUTH EVERY 4 HOURS AS NEEDED FOR PANIC ATTACKS, Disp: 30 tablet, Rfl: 3 .  Ibuprofen 200 MG CAPS, Take by mouth., Disp: , Rfl:  .  loratadine (CLARITIN REDITABS) 10 MG dissolvable tablet, Take by mouth., Disp: , Rfl:  .  omeprazole (PRILOSEC) 20 MG capsule, TAKE 1 CAPSULE (20 MG TOTAL) BY MOUTH DAILY., Disp: 30 capsule, Rfl: 11 .  sertraline (ZOLOFT) 100 MG tablet, Take 1 tablet (100 mg total) by mouth 2 (two) times daily., Disp: 60 tablet, Rfl: 12 .  Liniments (DEEP BLUE RELIEF EX), Apply topically., Disp: , Rfl:  .  Misc Natural Products (DETOX PO), Take by mouth. Bio-Cleanse supplement. Takes 2 capsules daily, Disp: , Rfl:  .  Multiple Vitamins-Minerals (WOMENS MULTIVITAMIN PLUS) TABS, Take by mouth., Disp: , Rfl:  .  Omega-3 Fatty Acids (FISH OIL PO), Take by mouth., Disp: , Rfl:  .  Probiotic CAPS, Take by mouth., Disp: , Rfl:   Review of Systems  Constitutional: Positive for fever. Negative for appetite change, chills, fatigue and weight loss.  Respiratory: Negative for chest tightness and shortness of breath.   Cardiovascular: Negative for chest pain and palpitations.  Gastrointestinal: Positive for abdominal distention, diarrhea, flatus, nausea and vomiting. Negative for abdominal pain, anorexia, constipation, hematochezia and melena.  Genitourinary: Positive for frequency and urgency. Negative for dysuria and hematuria.  Musculoskeletal: Positive for myalgias. Negative for arthralgias.  Neurological: Positive for headaches. Negative for dizziness and weakness.    Social History  Substance Use Topics  . Smoking status: Former Smoker    Quit date:  11/13/2005  . Smokeless tobacco: Never Used  . Alcohol use No   Objective:   BP 120/70 (BP Location: Right Arm, Patient Position: Sitting, Cuff Size: Large)   Temp 98.3 F (36.8 C) (Oral)   Resp 16   Wt 260 lb (117.9 kg)   BMI 46.06 kg/m  Vitals:   03/29/17 1602  BP: 120/70  Resp: 16  Temp: 98.3 F (36.8 C)    TempSrc: Oral  Weight: 260 lb (117.9 kg)     Physical Exam  General Appearance:    Alert, cooperative, no distress, obese  Eyes:    PERRL, conjunctiva/corneas clear, EOM's intact       Lungs:     Clear to auscultation bilaterally, respirations unlabored  Heart:    Regular rate and rhythm  Neurologic:   Awake, alert, oriented x 3. No apparent focal neurological           defect.   Abd:  Soft, tender RUQ. No rebound or guarding. No CVAT   Results for orders placed or performed in visit on 03/29/17  POCT urinalysis dipstick  Result Value Ref Range   Color, UA Yellow    Clarity, UA Clear    Glucose, UA Neg    Bilirubin, UA Neg    Ketones, UA Neg    Spec Grav, UA 1.015 1.010 - 1.025   Blood, UA Trace    pH, UA 6.0 5.0 - 8.0   Protein, UA Trace    Urobilinogen, UA 0.2 0.2 or 1.0 E.U./dL   Nitrite, UA Neg    Leukocytes, UA Negative Negative       Assessment & Plan:     1. Generalized abdominal pain High risk for gallbladder disease, consider RUQ ultrasound after reviewing labs.  - POCT urinalysis dipstick - Comprehensive metabolic panel - CBC with Differential/Platelet - H Pylori, IGM, IGG, IGA AB - Amylase  2. Diarrhea, unspecified type Cover with cipro and flagyl while awaiting stool studies.  - C difficile Toxins A+B W/Rflx - Fecal leukocytes - Ova and parasite examination - Stool culture - metroNIDAZOLE (FLAGYL) 500 MG tablet; Take 1 tablet (500 mg total) by mouth 3 (three) times daily.  Dispense: 21 tablet; Refill: 0 - ciprofloxacin (CIPRO) 500 MG tablet; Take 1 tablet (500 mg total) by mouth 2 (two) times daily.  Dispense: 14 tablet; Refill: 0       Lelon Huh, MD  [facility]

## 2017-03-30 ENCOUNTER — Other Ambulatory Visit: Payer: Self-pay | Admitting: Family Medicine

## 2017-03-30 DIAGNOSIS — R109 Unspecified abdominal pain: Secondary | ICD-10-CM

## 2017-03-31 ENCOUNTER — Ambulatory Visit
Admission: RE | Admit: 2017-03-31 | Discharge: 2017-03-31 | Disposition: A | Discharge status: Home / Self Care | Payer: BLUE CROSS/BLUE SHIELD | Source: Ambulatory Visit | Attending: Family Medicine | Admitting: Family Medicine

## 2017-03-31 ENCOUNTER — Telehealth: Payer: Self-pay | Admitting: *Deleted

## 2017-03-31 ENCOUNTER — Encounter: Payer: Self-pay | Admitting: Family Medicine

## 2017-03-31 DIAGNOSIS — R1010 Upper abdominal pain, unspecified: Secondary | ICD-10-CM | POA: Insufficient documentation

## 2017-03-31 DIAGNOSIS — K760 Fatty (change of) liver, not elsewhere classified: Secondary | ICD-10-CM | POA: Insufficient documentation

## 2017-03-31 DIAGNOSIS — K8020 Calculus of gallbladder without cholecystitis without obstruction: Secondary | ICD-10-CM | POA: Diagnosis not present

## 2017-03-31 DIAGNOSIS — R768 Other specified abnormal immunological findings in serum: Secondary | ICD-10-CM | POA: Insufficient documentation

## 2017-03-31 DIAGNOSIS — R109 Unspecified abdominal pain: Secondary | ICD-10-CM

## 2017-03-31 LAB — CBC WITH DIFFERENTIAL/PLATELET
BASOS: 0 %
Basophils Absolute: 0 10*3/uL (ref 0.0–0.2)
EOS (ABSOLUTE): 0.4 10*3/uL (ref 0.0–0.4)
Eos: 4 %
Hematocrit: 42.2 % (ref 34.0–46.6)
Hemoglobin: 14 g/dL (ref 11.1–15.9)
IMMATURE GRANULOCYTES: 1 %
Immature Grans (Abs): 0.1 10*3/uL (ref 0.0–0.1)
LYMPHS: 23 %
Lymphocytes Absolute: 2.4 10*3/uL (ref 0.7–3.1)
MCH: 28.5 pg (ref 26.6–33.0)
MCHC: 33.2 g/dL (ref 31.5–35.7)
MCV: 86 fL (ref 79–97)
MONOS ABS: 0.9 10*3/uL (ref 0.1–0.9)
Monocytes: 9 %
NEUTROS PCT: 63 %
Neutrophils Absolute: 6.9 10*3/uL (ref 1.4–7.0)
PLATELETS: 274 10*3/uL (ref 150–379)
RBC: 4.91 x10E6/uL (ref 3.77–5.28)
RDW: 14 % (ref 12.3–15.4)
WBC: 10.8 10*3/uL (ref 3.4–10.8)

## 2017-03-31 LAB — COMPREHENSIVE METABOLIC PANEL
ALK PHOS: 112 IU/L (ref 39–117)
ALT: 46 IU/L — AB (ref 0–32)
AST: 23 IU/L (ref 0–40)
Albumin/Globulin Ratio: 1.6 (ref 1.2–2.2)
Albumin: 4.3 g/dL (ref 3.5–5.5)
BUN/Creatinine Ratio: 16 (ref 9–23)
BUN: 17 mg/dL (ref 6–24)
Bilirubin Total: 0.4 mg/dL (ref 0.0–1.2)
CALCIUM: 8.9 mg/dL (ref 8.7–10.2)
CO2: 24 mmol/L (ref 18–29)
CREATININE: 1.07 mg/dL — AB (ref 0.57–1.00)
Chloride: 97 mmol/L (ref 96–106)
GFR calc Af Amer: 70 mL/min/{1.73_m2} (ref >59)
GFR calc non Af Amer: 61 mL/min/{1.73_m2} (ref >59)
GLUCOSE: 89 mg/dL (ref 65–99)
Globulin, Total: 2.7 g/dL (ref 1.5–4.5)
Potassium: 3.9 mmol/L (ref 3.5–5.2)
Sodium: 141 mmol/L (ref 134–144)
Total Protein: 7 g/dL (ref 6.0–8.5)

## 2017-03-31 LAB — H PYLORI, IGM, IGG, IGA AB
H. pylori, IgA Abs: 12.7 units — ABNORMAL HIGH (ref 0.0–8.9)
H. pylori, IgG AbS: <0.8 Index Value (ref 0.00–0.79)

## 2017-03-31 LAB — AMYLASE: Amylase: 29 U/L — ABNORMAL LOW (ref 31–124)

## 2017-03-31 NOTE — Telephone Encounter (Signed)
Can send in prescription for tramadol 50mg  once every eight hours as needed for pain. Need to avoid fatty foods as much as possible because they stimulate gallbladder to contract and can aggravate pain. Sandra Wilkins

## 2017-03-31 NOTE — Telephone Encounter (Signed)
Received call report from [place], notifying us that US abdomen results are in EPIC. Also pt would like to get results today. Please advise results?

## 2017-03-31 NOTE — Telephone Encounter (Signed)
Patient wanted to know if there is a way to naturally pass the gallstones, (herbal)? Also she wants to know what other medication she can take instead of ibuprofen to help with pain and discomfort? Please advise?

## 2017-03-31 NOTE — Telephone Encounter (Signed)
Pt advised.  She does not want the Tramadol at this time.  I advised her to call back if she changes her mind.   Thanks,   -Mickel Baas

## 2017-04-03 ENCOUNTER — Encounter: Payer: Self-pay | Admitting: Family Medicine

## 2017-04-03 NOTE — Telephone Encounter (Signed)
Please call in tramadol 50mg  once every eight hours as needed for pain. Recommend go ahead and refer to GI for abdominal pain and gallstones since she is not improving on the antibiotics.

## 2017-04-05 ENCOUNTER — Telehealth: Payer: Self-pay

## 2017-04-05 DIAGNOSIS — K8020 Calculus of gallbladder without cholecystitis without obstruction: Secondary | ICD-10-CM

## 2017-04-05 DIAGNOSIS — R109 Unspecified abdominal pain: Secondary | ICD-10-CM

## 2017-04-05 MED ORDER — TRAMADOL HCL 50 MG PO TABS: 50.0000 mg | ORAL_TABLET | Freq: Three times a day (TID) | ORAL | 1 refills | Status: DC | PRN

## 2017-04-05 NOTE — Telephone Encounter (Signed)
Tried calling patient. Left message to call back. 

## 2017-04-05 NOTE — Telephone Encounter (Signed)
Per Dr. Caryn Section in patient email message, OK to call in Tramadol 50mg  once every 8 hours as needed for pain. He also recommend GI referral for abdominal pain and gallstones since not improving on the antibiotics.  Patient has been advised of this and agrees to referral. I spoke with Dr. Caryn Section again to verify these recommendations and he states that patient needs referral to Gen. Surgery instead of GI.  Order has been placed. Please schedule. Thanks.

## 2017-04-05 NOTE — Telephone Encounter (Signed)
Patient advised. See separate message for additional documentation.

## 2017-04-06 ENCOUNTER — Encounter: Payer: Self-pay | Admitting: General Surgery

## 2017-04-06 ENCOUNTER — Ambulatory Visit (INDEPENDENT_AMBULATORY_CARE_PROVIDER_SITE_OTHER): Payer: BLUE CROSS/BLUE SHIELD | Admitting: General Surgery

## 2017-04-06 VITALS — BP 126/84 | HR 82 | Resp 12 | Ht 62.5 in | Wt 257.0 lb

## 2017-04-06 DIAGNOSIS — K8020 Calculus of gallbladder without cholecystitis without obstruction: Secondary | ICD-10-CM

## 2017-04-06 NOTE — Patient Instructions (Addendum)
The patient is aware to call back for any questions or concerns. Encouraged to eat yogurt or probiotics while taking the antibiotics.  Laparoscopic Cholecystectomy Laparoscopic cholecystectomy is surgery to remove the gallbladder. The gallbladder is a pear-shaped organ that lies beneath the liver on the right side of the body. The gallbladder stores bile, which is a fluid that helps the body to digest fats. Cholecystectomy is often done for inflammation of the gallbladder (cholecystitis). This condition is usually caused by a buildup of gallstones (cholelithiasis) in the gallbladder. Gallstones can block the flow of bile, which can result in inflammation and pain. In severe cases, emergency surgery may be required. This procedure is done though small incisions in your abdomen (laparoscopic surgery). A thin scope with a camera (laparoscope) is inserted through one incision. Thin surgical instruments are inserted through the other incisions. In some cases, a laparoscopic procedure may be turned into a type of surgery that is done through a larger incision (open surgery). Tell a health care provider about:  Any allergies you have.  All medicines you are taking, including vitamins, herbs, eye drops, creams, and over-the-counter medicines.  Any problems you or family members have had with anesthetic medicines.  Any blood disorders you have.  Any surgeries you have had.  Any medical conditions you have.  Whether you are pregnant or may be pregnant. What are the risks? Generally, this is a safe procedure. However, problems may occur, including:  Infection.  Bleeding.  Allergic reactions to medicines.  Damage to other structures or organs.  A stone remaining in the common bile duct. The common bile duct carries bile from the gallbladder into the small intestine.  A bile leak from the cyst duct that is clipped when your gallbladder is removed. What happens before the procedure? Staying  hydrated  Follow instructions from your health care provider about hydration, which may include:  Up to 2 hours before the procedure - you may continue to drink clear liquids, such as water, clear fruit juice, black coffee, and plain tea. Eating and drinking restrictions  Follow instructions from your health care provider about eating and drinking, which may include:  8 hours before the procedure - stop eating heavy meals or foods such as meat, fried foods, or fatty foods.  6 hours before the procedure - stop eating light meals or foods, such as toast or cereal.  6 hours before the procedure - stop drinking milk or drinks that contain milk.  2 hours before the procedure - stop drinking clear liquids. Medicines   Ask your health care provider about:  Changing or stopping your regular medicines. This is especially important if you are taking diabetes medicines or blood thinners.  Taking medicines such as aspirin and ibuprofen. These medicines can thin your blood. Do not take these medicines before your procedure if your health care provider instructs you not to.  You may be given antibiotic medicine to help prevent infection. General instructions   Let your health care provider know if you develop a cold or an infection before surgery.  Plan to have someone take you home from the hospital or clinic.  Ask your health care provider how your surgical site will be marked or identified. What happens during the procedure?  To reduce your risk of infection:  Your health care team will wash or sanitize their hands.  Your skin will be washed with soap.  Hair may be removed from the surgical area.  An IV tube may be inserted  into one of your veins.  You will be given one or more of the following:  A medicine to help you relax (sedative).  A medicine to make you fall asleep (general anesthetic).  A breathing tube will be placed in your mouth.  Your surgeon will make several small  cuts (incisions) in your abdomen.  The laparoscope will be inserted through one of the small incisions. The camera on the laparoscope will send images to a TV screen (monitor) in the operating room. This lets your surgeon see inside your abdomen.  Air-like gas will be pumped into your abdomen. This will expand your abdomen to give the surgeon more room to perform the surgery.  Other tools that are needed for the procedure will be inserted through the other incisions. The gallbladder will be removed through one of the incisions.  Your common bile duct may be examined. If stones are found in the common bile duct, they may be removed.  After your gallbladder has been removed, the incisions will be closed with stitches (sutures), staples, or skin glue.  Your incisions may be covered with a bandage (dressing). The procedure may vary among health care providers and hospitals. What happens after the procedure?  Your blood pressure, heart rate, breathing rate, and blood oxygen level will be monitored until the medicines you were given have worn off.  You will be given medicines as needed to control your pain.  Do not drive for 24 hours if you were given a sedative. This information is not intended to replace advice given to you by your health care provider. Make sure you discuss any questions you have with your health care provider. Document Released: 10/31/2005 Document Revised: 05/22/2016 Document Reviewed: 04/18/2016 Elsevier Interactive Patient Education  2017 [employer].

## 2017-04-06 NOTE — Progress Notes (Signed)
Patient ID: Sandra Wilkins, female   DOB: 16-Dec-1965, 51 y.o.   MRN: [id number]  Chief Complaint  Patient presents with  . Abdominal Pain    HPI Sandra Wilkins is a 51 y.o. female.  Here today for evaluation of abdominal pain referred by Dr  Caryn Section. She admits to long term heart burn/GERD symptoms. She does avoid various spicy foods. She states she started having mid abdominal pain last Saturday 03-25-17. She states the pain is still there it's just not as bad. She states the pain is epigastric as well and radiating to mid back. She states she was given Xanax thinking it was a panic attack back in February. Sharp pains mid chest randomly throughout day. Not related to activity. She was given cipro and flagyl on Saturday 04-01-17 for infection H Pylori (post cruise 03-04-17 to 03-11-17). She does remember having some pain and nausea on the cruise. She admits to nausea and vomiting on 03-25-17, no vomiting since, just nausea. She has had diarrhea dating back to early in the month with frequent loose stools, mixed formed, pudding-like stools with frank diarrhea. Ultrasound was 03-31-17. She states she did eat rice for dinner and work up during the night with upper abdominal pain. She works at the [employer]. Her work involves direct contact with the pets.  HPI  Past Medical History:  Diagnosis Date  . Depression   . GERD (gastroesophageal reflux disease)   . Sleep apnea    CPAP    Past Surgical History:  Procedure Laterality Date  . BREAST BIOPSY Right 2012   benign  . TONSILLECTOMY AND ADENOIDECTOMY  1972    Family History  Problem Relation Age of Onset  . Hyperlipidemia Father   . Depression Sister   . Bipolar disorder Brother   . Seizures Brother   . Congestive Heart Failure Maternal Grandfather   . Dementia Paternal Grandfather   . Depression Sister   . Depression Mother   . Arthritis Mother   . Emphysema Mother   . Congestive Heart Failure Mother   .  Arthritis Maternal Grandmother   . Asthma Maternal Grandmother     Social History Social History  Substance Use Topics  . Smoking status: Former Smoker    Quit date: 11/13/2005  . Smokeless tobacco: Never Used  . Alcohol use No    Allergies  Allergen Reactions  . Codeine Other (See Comments)  . Fish Oil Other (See Comments)    Indigestion Indigestion  . Penicillins Hives  . Sulfa Antibiotics Itching    Current Outpatient Prescriptions  Medication Sig Dispense Refill  . ALPRAZolam (XANAX) 0.5 MG tablet TAKE 1/2-2 TABLETS BY MOUTH EVERY 4 HOURS AS NEEDED FOR PANIC ATTACKS 30 tablet 3  . cetirizine (ZYRTEC) 10 MG tablet Take 10 mg by mouth daily.    . ciprofloxacin (CIPRO) 500 MG tablet Take 500 mg by mouth 2 (two) times daily.    . Ibuprofen 200 MG CAPS Take by mouth as needed.     . metroNIDAZOLE (FLAGYL) 500 MG tablet Take 1 tablet (500 mg total) by mouth 3 (three) times daily. 21 tablet 0  . omeprazole (PRILOSEC) 20 MG capsule TAKE 1 CAPSULE (20 MG TOTAL) BY MOUTH DAILY. (Patient taking differently: TAKE 1 CAPSULE (20 MG TOTAL) BY MOUTH twice a day) 30 capsule 11  . sertraline (ZOLOFT) 100 MG tablet Take 1 tablet (100 mg total) by mouth 2 (two) times daily. 60 tablet 12   No current  facility-administered medications for this visit.     Review of Systems Review of Systems  Constitutional: Negative.   Respiratory: Negative.   Cardiovascular: Negative.   Gastrointestinal: Positive for abdominal pain, nausea and vomiting.    Blood pressure 126/84, pulse 82, resp. rate 12, height 5' 2.5" (1.588 m), weight 257 lb (116.6 kg), last menstrual period 09/23/2016.  Physical Exam Physical Exam  Constitutional: She is oriented to person, place, and time. She appears well-developed and well-nourished.  HENT:  Mouth/Throat: Oropharynx is clear and moist.  Eyes: Conjunctivae are normal. No scleral icterus.  Neck: Neck supple.  Cardiovascular: Normal rate, regular rhythm and  normal heart sounds.   Pulmonary/Chest: Effort normal and breath sounds normal.  Abdominal: Soft. Normal appearance and bowel sounds are normal. She exhibits no distension, no fluid wave and no ascites. There is tenderness in the right upper quadrant.    Lymphadenopathy:    She has no cervical adenopathy.  Neurological: She is alert and oriented to person, place, and time.  Skin: Skin is warm and dry.  Psychiatric: Her behavior is normal.    Data Reviewed 07/23/2015 GI consultation for elevated liver enzymes completed by Arther Dames, M.D.  01/11/2017 PCP evaluation for panic attacks. Nonexertional episodes typically occurring at work. Anxiety with X recommended. Follow-up visit of 02/01/2017 reported improvement.  03/29/2017 PCP visit for acute onset of abdominal pain, flatus, belching, diarrhea, nausea and vomiting. Stool samples ordered and patient empirically treated with Cipro and Flagyl pending those studies. Requested stool samples never submitted. Laboratory studies of 03/29/2017 revealed a normal amylase. Elevated H. pylori IgA. Negative IgG/IgM. Right upper quadrant tenderness noted during this exam.  Comprehensive metabolic panel was notable for a creatinine of 1.07 with an estimated GFR greater than 60. Liver function is notable for a mild elevation of the SGPT, unchanged from previous determinations. Normal bilirubin.  CBC was unremarkable with a hemoglobin 14.0 and white blood cell count of 10,800, platelet count of 274,000.  Urinalysis of the same date was negative.  Abdominal ultrasound dated 03/31/2017 showed cholelithiasis with borderline gallbladder wall thickening but no Murphy sign on ultrasound exam. Common bile duct was normal. Hepatic steatosis.   Assessment    Symptomatic cholelithiasis.  Diarrhea, ongoing in spite of near completion of Cipro and Flagyl treatment 1 week.    Plan    The patient will likely benefit from elective cholecystectomy. I spoke  informally with GI as to whether liver biopsy at time of surgery would impact her care with her several year history of elevated liver function studies and evidence of hepatic steatosis on imaging. Unless the liver appeared clinically abnormal suggesting  cirrhosis, liver biopsy would not be indicated.  The patient comes in direct contact with animals as part of her regular job responsibilities at the [employer]. We will ask her to complete the stool samples previously requested by her PCP.  At the time of the patient's visit she was under the impression that the Cipro and Flagyl warfarin H. pylori infection. Based on review of the laboratory studies and Dr. Maralyn Sago notes this was not the case. The patient continues to have loose stools and she will be encouraged to obtain the stool samples previously requested by Dr. Caryn Section as at the time of today's exam she is still reporting diarrhea.  The patient is aware I will be out of town next week and offered a have her care provided by my partner.    Encouraged to eat yogurt or probiotics while  taking the antibiotics.  Laparoscopic Cholecystectomy with Intraoperative Cholangiogram. The procedure, including it's potential risks and complications (including but not limited to infection, bleeding, injury to intra-abdominal organs or bile ducts, bile leak, poor cosmetic result, sepsis and death) were discussed with the patient in detail. Non-operative options, including their inherent risks (acute calculous cholecystitis with possible choledocholithiasis or gallstone pancreatitis, with the risk of ascending cholangitis, sepsis, and death) were discussed as well. The patient expressed and understanding of what we discussed and wishes to proceed with laparoscopic cholecystectomy. The patient further understands that if it is technically not possible, or it is unsafe to proceed laparoscopically, that I will convert to an open cholecystectomy.  The patient was  instructed to bring her CPAP unit to the hospital the day of surgery.  HPI, Physical Exam, Assessment and Plan have been scribed under the direction and in the presence of Robert Bellow, MD.  Karie Fetch, RN   Robert Bellow 04/07/2017, 7:01 AM  Patient's surgery has been scheduled for 04-17-17 at [facility]. She was offered to have surgery with Dr. Jamal Collin since Dr. Bary Castilla will be out of town next week. Patient states that she has not met Dr. Jamal Collin and would feel more comfortable having Dr. Bary Castilla complete her surgery.   This patient has been notified that our office is not in network with her insurance and made aware that this will be covered at out of network benefits meaning more out of pocket costs. Patient verbalizes understanding and wishes to proceed with surgery as scheduled.   Dominga Ferry, CMA

## 2017-04-07 ENCOUNTER — Telehealth: Payer: Self-pay

## 2017-04-07 ENCOUNTER — Ambulatory Visit: Payer: Self-pay | Admitting: Family Medicine

## 2017-04-07 NOTE — Telephone Encounter (Signed)
-----   Message from Robert Bellow, MD sent at 04/07/2017  7:15 AM EDT ----- The patient had been asked to have multiple stool test completed by Dr. Caryn Section at the time of her May 16 visit. I do not see that samples have been submitted, and I would like you to contact the patient to make arrangements for the stool samples to be completed as the orders are still active.

## 2017-04-08 ENCOUNTER — Encounter: Payer: Self-pay | Admitting: Family Medicine

## 2017-04-09 ENCOUNTER — Encounter: Payer: Self-pay | Admitting: General Surgery

## 2017-04-11 ENCOUNTER — Ambulatory Visit (INDEPENDENT_AMBULATORY_CARE_PROVIDER_SITE_OTHER): Payer: BLUE CROSS/BLUE SHIELD | Admitting: General Surgery

## 2017-04-11 ENCOUNTER — Other Ambulatory Visit: Payer: Self-pay

## 2017-04-11 ENCOUNTER — Telehealth: Payer: Self-pay | Admitting: *Deleted

## 2017-04-11 ENCOUNTER — Encounter: Payer: Self-pay | Admitting: General Surgery

## 2017-04-11 ENCOUNTER — Other Ambulatory Visit
Admission: RE | Admit: 2017-04-11 | Discharge: 2017-04-11 | Disposition: A | Discharge status: Home / Self Care | Payer: BLUE CROSS/BLUE SHIELD | Source: Ambulatory Visit | Attending: General Surgery | Admitting: General Surgery

## 2017-04-11 VITALS — BP 146/90 | HR 94 | Temp 98.9°F | Resp 14 | Ht 62.5 in | Wt 247.0 lb

## 2017-04-11 DIAGNOSIS — K831 Obstruction of bile duct: Secondary | ICD-10-CM

## 2017-04-11 DIAGNOSIS — K8020 Calculus of gallbladder without cholecystitis without obstruction: Secondary | ICD-10-CM

## 2017-04-11 DIAGNOSIS — K838 Other specified diseases of biliary tract: Secondary | ICD-10-CM | POA: Diagnosis not present

## 2017-04-11 LAB — HEPATIC FUNCTION PANEL
ALK PHOS: 539 U/L — AB (ref 38–126)
ALT: 649 U/L — ABNORMAL HIGH (ref 14–54)
AST: 273 U/L — ABNORMAL HIGH (ref 15–41)
Albumin: 3.5 g/dL (ref 3.5–5.0)
BILIRUBIN INDIRECT: 2.8 mg/dL — AB (ref 0.3–0.9)
BILIRUBIN TOTAL: 7.7 mg/dL — AB (ref 0.3–1.2)
Bilirubin, Direct: 4.9 mg/dL — ABNORMAL HIGH (ref 0.1–0.5)
TOTAL PROTEIN: 7.7 g/dL (ref 6.5–8.1)

## 2017-04-11 LAB — CBC WITH DIFFERENTIAL/PLATELET
BASOS PCT: 1 %
Basophils Absolute: 0.1 10*3/uL (ref 0–0.1)
EOS ABS: 0.1 10*3/uL (ref 0–0.7)
Eosinophils Relative: 1 %
HEMATOCRIT: 43.8 % (ref 35.0–47.0)
HEMOGLOBIN: 14.9 g/dL (ref 12.0–16.0)
LYMPHS ABS: 1.5 10*3/uL (ref 1.0–3.6)
Lymphocytes Relative: 18 %
MCH: 28.2 pg (ref 26.0–34.0)
MCHC: 33.9 g/dL (ref 32.0–36.0)
MCV: 83.1 fL (ref 80.0–100.0)
MONO ABS: 0.7 10*3/uL (ref 0.2–0.9)
MONOS PCT: 8 %
NEUTROS PCT: 72 %
Neutro Abs: 6.2 10*3/uL (ref 1.4–6.5)
Platelets: 305 10*3/uL (ref 150–440)
RBC: 5.27 MIL/uL — ABNORMAL HIGH (ref 3.80–5.20)
RDW: 14.9 % — AB (ref 11.5–14.5)
WBC: 8.5 10*3/uL (ref 3.6–11.0)

## 2017-04-11 LAB — LIPASE, BLOOD: LIPASE: 36 U/L (ref 11–51)

## 2017-04-11 MED ORDER — CIPROFLOXACIN HCL 500 MG PO TABS: 500.0000 mg | ORAL_TABLET | Freq: Two times a day (BID) | ORAL | 0 refills | Status: AC

## 2017-04-11 MED ORDER — OMEPRAZOLE 20 MG PO CPDR: DELAYED_RELEASE_CAPSULE | ORAL | 11 refills | Status: DC

## 2017-04-11 NOTE — Telephone Encounter (Signed)
Patient called to let us know that her pain is better today. She is concerned as her eyes have been yellow for the past two days. She also says her skin has a tan cast and her urine is orange brown. I notified Dr Jamal Collin as Dr Bary Castilla is out of town at this time. Orders for the patient to complete CBC, Liver Panel, Lipase at [facility] today. She will follow up here in office at 3:45 pm with Dr Jamal Collin.

## 2017-04-11 NOTE — Patient Instructions (Addendum)
The patient is aware to call back for any questions or concerns.  Endoscopic Retrograde Cholangiopancreatogram Endoscopic retrograde cholangiopancreatogram (ERCP) is a procedure that may be used to diagnose or treat problems with the pancreas, bile ducts, liver, and gallbladder. For this procedure, a thin, lighted tube (endoscope) is passed through the mouth, the throat, and down into the areas being checked. The endoscope has a camera that allows the areas to be viewed. Dye is injected and then X-rays are taken to further study the areas. During ERCP, other procedures may also be done to help diagnose or treat problems that are found. For example, stones can be removed, or a tissue sample can be taken out for testing (biopsy). Tell a health care provider about:  Any allergies you have.  All medicines you are taking, including vitamins, herbs, eye drops, creams, and over-the-counter medicines.  Any problems you or family members have had with anesthetic medicines.  Any blood disorders you have.  Any surgeries you have had.  Any medical conditions you have.  Whether you are pregnant or may be pregnant. What are the risks? Generally, this is a safe procedure. However, problems may occur, including:  Pancreatitis.  Infection.  Bleeding.  Allergic reactions to medicines or dyes.  Accidental punctures in the bowel wall, pancreas, or gallbladder.  Damage to other structures or organs. What happens before the procedure? Staying hydrated  Follow instructions from your health care provider about hydration, which may include:  Up to 2 hours before the procedure - you may continue to drink clear liquids, such as water, clear fruit juice, black coffee, and plain tea. Eating and drinking restrictions  Follow instructions from your health care provider about eating and drinking, which may include:  8 hours before the procedure - stop eating heavy meals or foods such as meat, fried foods,  or fatty foods.  6 hours before the procedure - stop eating light meals or foods, such as toast or cereal.  6 hours before the procedure - stop drinking milk or drinks that contain milk.  2 hours before the procedure - stop drinking clear liquids. General instructions   Ask your health care provider about:  Changing or stopping your regular medicines. This is especially important if you are taking diabetes medicines or blood thinners.  Taking medicines such as aspirin and ibuprofen. These medicines can thin your blood. Do not take these medicines before your procedure if your health care provider instructs you not to.  Plan to have someone take you home from the hospital or clinic.  If you will be going home right after the procedure, plan to have someone with you for 24 hours. What happens during the procedure?  To lower your risk of infection, your health care team will wash or sanitize their hands.  An IV tube will be inserted into one of your veins.  You will be given one or more of the following:  A medicine to help you relax (sedative).  A medicine to numb the throat area (local anesthetic) and prevent gagging. Your throat may be sprayed with this medicine, or you may gargle the medicine.  A medicine to make you fall asleep (general anesthetic).  A medicine to lower your risk of infection (antibiotic), inflammation (anti-inflammatory), or both.  You will lie on your left side.  The endoscope will be inserted through your mouth, down the back of the throat, and into the first part of the small intestine (duodenum).  Then a small, plastic tube (  cannula) will be passed through the endoscope and directed into the bile duct or pancreatic duct.  Dye will be injected through the cannula to make structures easier to see on an X-ray.  X-rays will be taken to study the biliary and pancreatic passageways. You may be positioned on your abdomen or your back during the X-rays.  A  small sample of tissue (biopsy) may be removed for examination, or other procedures may be done to fix problems that are found. The procedure may vary among health care providers and hospitals. What happens after the procedure?  Your blood pressure, heart rate, breathing rate, and blood oxygen level will be monitored until the medicines you were given have worn off.  Your throat may feel slightly sore.  You will not be allowed to eat or drink until numbness subsides.  Do not drive for 24 hours if you were given a sedative. Summary  Endoscopic retrograde cholangiopancreatogram is a procedure that may be used to diagnose or treat problems with the pancreas, bile ducts, liver, and gallbladder.  During ERCP, other procedures may also be done to help diagnose or treat problems that are found. For example, stones can be removed, or a tissue sample can be taken out for testing (biopsy).  Generally, this is a safe procedure. However, problems may occur, including infection, bleeding, pancreatitis, accidental damage to other structures or organs, and allergic reactions to medicines or dyes.  The procedure may vary among health care providers and hospitals. This information is not intended to replace advice given to you by your health care provider. Make sure you discuss any questions you have with your health care provider. Document Released: 07/26/2001 Document Revised: 09/26/2016 Document Reviewed: 09/26/2016 Elsevier Interactive Patient Education  2017 [employer].

## 2017-04-11 NOTE — Progress Notes (Signed)
Patient ID: Sandra Wilkins, female   DOB: Jan 05, 1966, 51 y.o.   MRN: [id number]  Chief Complaint  Patient presents with  . Follow-up    HPI Sandra Wilkins is a 51 y.o. female.  Here for follow up gall stones. She has surgery scheduled for 04-17-17 with Dr Sandra Wilkins. She states that her pain is better today. She is concerned as her eyes have been yellow for the past two days. She also says her skin has a tan cast and her urine is orange brown. She is tolerating a liquid diet today. Labs drawn earlier today.   HPI  Past Medical History:  Diagnosis Date  . Anxiety   . Arthritis    DDD-NECK  . Depression   . GERD (gastroesophageal reflux disease)   . Headache    MIGRAINES  . Hypertension    H/O WAS ON FUROSEMIDE IN THE PAST BUT LOST WEIGHT AND WAS TAKEN OFF DUE TO BP CONTROL  . Pre-diabetes   . Sleep apnea    CPAP    Past Surgical History:  Procedure Laterality Date  . BREAST BIOPSY Right 2012   benign  . TONSILLECTOMY AND ADENOIDECTOMY  1972    Family History  Problem Relation Age of Onset  . Hyperlipidemia Father   . Depression Sister   . Bipolar disorder Brother   . Seizures Brother   . Congestive Heart Failure Maternal Grandfather   . Dementia Paternal Grandfather   . Depression Sister   . Depression Mother   . Arthritis Mother   . Emphysema Mother   . Congestive Heart Failure Mother   . Arthritis Maternal Grandmother   . Asthma Maternal Grandmother     Social History Social History  Substance Use Topics  . Smoking status: Former Smoker    Packs/day: 0.50    Years: 20.00    Types: Cigarettes    Quit date: 11/13/2005  . Smokeless tobacco: Never Used  . Alcohol use Yes     Comment: RARE    Allergies  Allergen Reactions  . Codeine Hives  . Fish Oil Other (See Comments)    Indigestion Indigestion  . Other Hives and Itching    Metronidazole or Ciprofloxacin    . Sulfa Antibiotics Hives and Itching  . Tramadol Other (See Comments)    Hallucinations   . Penicillins Rash    Has patient had a PCN reaction causing immediate rash, facial/tongue/throat swelling, SOB or lightheadedness with hypotension: Unknown Has patient had a PCN reaction causing severe rash involving mucus membranes or skin necrosis: Unknown Has patient had a PCN reaction that required hospitalization: Unknown Has patient had a PCN reaction occurring within the last 10 years: No If all of the above answers are "NO", then may proceed with Cephalosporin use.     Current Outpatient Prescriptions  Medication Sig Dispense Refill  . ALPRAZolam (XANAX) 0.5 MG tablet TAKE 1/2-2 TABLETS BY MOUTH EVERY 4 HOURS AS NEEDED FOR PANIC ATTACKS 30 tablet 3  . cetirizine (ZYRTEC) 10 MG tablet Take 10 mg by mouth every morning. NOT TAKING SINCE SHE HAS BEEN SICK WITH GALLBLADDER    . Ibuprofen 200 MG CAPS Take 400 mg by mouth 2 (two) times daily as needed (PAIN).     Marland Kitchen omeprazole (PRILOSEC) 20 MG capsule TAKE 1 CAPSULE (20 MG TOTAL) BY MOUTH TWICE DAILY 60 capsule 11  . sertraline (ZOLOFT) 100 MG tablet Take 1 tablet (100 mg total) by mouth 2 (two) times daily. (Patient taking differently: Take 100  mg by mouth at bedtime. ) 60 tablet 12  . ciprofloxacin (CIPRO) 500 MG tablet Take 1 tablet (500 mg total) by mouth 2 (two) times daily. 10 tablet 0   No current facility-administered medications for this visit.     Review of Systems Review of Systems  Constitutional: Negative.   Respiratory: Negative.   Cardiovascular: Negative.     Blood pressure (!) 146/90, pulse 94, temperature 98.9 F (37.2 C), temperature source Oral, resp. rate 14, height 5' 2.5" (1.588 m), weight 247 lb (112 kg).  Physical Exam Physical Exam  Constitutional: She is oriented to person, place, and time. She appears well-developed and well-nourished.  HENT:  Mouth/Throat: Oropharynx is clear and moist.  Eyes: Scleral icterus is present.  Neck: Neck supple.  Cardiovascular: Normal rate, regular rhythm and normal  heart sounds.   Pulmonary/Chest: Effort normal and breath sounds normal.  Abdominal: Soft. Normal appearance and bowel sounds are normal. There is tenderness. There is positive Murphy's sign.  Lymphadenopathy:    She has no cervical adenopathy.  Neurological: She is alert and oriented to person, place, and time.  Skin: Skin is warm, dry and intact.  Psychiatric: Her behavior is normal.    Data Reviewed Prior progress notes Labs WBC is normal. Liver enzymes are elevated and a bilirubin is 7.7 with mostly direct component. Lipase is normal Assessment    Gall stones Obstructive jaundice    Plan    Refer to GI for ERCP. phone call was made to Dr. Evalee Wilkins office will contact the patient and schedule ERCP for Thursday most likely Cipro 500 mg po BID #10 RX sent She will monitor her symptoms and call if she develops increased pain, chills or fever.     HPI, Physical Exam, Assessment and Plan have been scribed under the direction and in the presence of Sandra Jewel, MD  Sandra Fetch, RN I have completed the exam and reviewed the above documentation for accuracy and completeness.  I agree with the above.  Haematologist has been used and any errors in dictation or transcription are unintentional.  Sandra Wilkins Sandra Wilkins, M.D., F.A.C.S.   Sandra Wilkins G 04/13/2017, 8:34 AM

## 2017-04-11 NOTE — Telephone Encounter (Signed)
Spoke with patient about completing the stool specimens and she is amendable to this. She is asking for a work excuse for 04/07/17 and 04/08/17 as she was in a lot of pain and unable to work due to gallbladder attack pain. She is much better today and is ready for her surgery. I let her know that I would fax this to her work and to call us with any further questions.

## 2017-04-12 ENCOUNTER — Encounter
Admission: RE | Admit: 2017-04-12 | Discharge: 2017-04-12 | Disposition: A | Discharge status: Home / Self Care | Payer: BLUE CROSS/BLUE SHIELD | Source: Ambulatory Visit | Attending: General Surgery | Admitting: General Surgery

## 2017-04-12 ENCOUNTER — Telehealth: Payer: Self-pay | Admitting: Gastroenterology

## 2017-04-12 HISTORY — DX: Prediabetes: R73.03

## 2017-04-12 HISTORY — DX: Essential (primary) hypertension: I10

## 2017-04-12 HISTORY — DX: Headache: R51

## 2017-04-12 HISTORY — DX: Anxiety disorder, unspecified: F41.9

## 2017-04-12 HISTORY — DX: Unspecified osteoarthritis, unspecified site: M19.90

## 2017-04-12 HISTORY — DX: Headache, unspecified: R51.9

## 2017-04-12 NOTE — Telephone Encounter (Signed)
No prior authorization required for ERCP CPT code 43275 per Nisha L. 04/12/17.

## 2017-04-12 NOTE — Patient Instructions (Addendum)
  Your procedure is scheduled on: 04-17-17 Report to Same Day Surgery 2nd floor medical mall [facility] Entrance-take elevator on left to 2nd floor.  Check in with surgery information desk.) To find out your arrival time please call [phone] between 1PM - 3PM on 04-14-17  Remember: Instructions that are not followed completely may result in serious medical risk, up to and including death, or upon the discretion of your surgeon and anesthesiologist your surgery may need to be rescheduled.    _x___ 1. Do not eat food or drink liquids after midnight. No gum chewing or hard candies.     __x__ 2. No Alcohol for 24 hours before or after surgery.   __x__3. No Smoking for 24 prior to surgery.   ____  4. Bring all medications with you on the day of surgery if instructed.    __x__ 5. Notify your doctor if there is any change in your medical condition     (cold, fever, infections).     Do not wear jewelry, make-up, hairpins, clips or nail polish.  Do not wear lotions, powders, or perfumes. You may wear deodorant.  Do not shave 48 hours prior to surgery. Men may shave face and neck.  Do not bring valuables to the hospital.    [facility] is not responsible for any belongings or valuables.               Contacts, dentures or bridgework may not be worn into surgery.  Leave your suitcase in the car. After surgery it may be brought to your room.  For patients admitted to the hospital, discharge time is determined by your                       treatment team.   Patients discharged the day of surgery will not be allowed to drive home.  You will need someone to drive you home and stay with you the night of your procedure.    Please read over the following fact sheets that you were given:   [facility] Preparing for Surgery and or MRSA Information   _x___ Take anti-hypertensive (unless it includes a diuretic), cardiac, seizure, asthma,     anti-reflux and psychiatric medicines. These include:  1.  PRILOSEC  2.  3.  4.  5.  6.  ____Fleets enema or Magnesium Citrate as directed.   ____ Use CHG Soap or sage wipes as directed on instruction sheet   ____ Use inhalers on the day of surgery and bring to hospital day of surgery  ____ Stop Metformin and Janumet 2 days prior to surgery.    ____ Take 1/2 of usual insulin dose the night before surgery and none on the morning     surgery.   ____ Follow recommendations from Cardiologist, Pulmonologist or PCP regarding  stopping Aspirin, Coumadin, Pllavix ,Eliquis, Effient, or Pradaxa, and Pletal.  ____Stop Anti-inflammatories such as Advil, Aleve, Ibuprofen, Motrin, Naproxen, Naprosyn, Goodies powders or aspirin products. OK to take Tylenol    ____ Stop supplements until after surgery   _X___ Bring C-Pap to the hospital.

## 2017-04-12 NOTE — Telephone Encounter (Signed)
Patient is returning your call regarding scheduling a surgery for tomorrow. Please call

## 2017-04-13 ENCOUNTER — Ambulatory Visit: Payer: BLUE CROSS/BLUE SHIELD | Admitting: Anesthesiology

## 2017-04-13 ENCOUNTER — Ambulatory Visit: Payer: BLUE CROSS/BLUE SHIELD | Admitting: General Surgery

## 2017-04-13 ENCOUNTER — Ambulatory Visit
Admission: RE | Admit: 2017-04-13 | Discharge: 2017-04-13 | Disposition: A | Discharge status: Home / Self Care | Payer: BLUE CROSS/BLUE SHIELD | Source: Ambulatory Visit | Attending: Gastroenterology | Admitting: Gastroenterology

## 2017-04-13 ENCOUNTER — Ambulatory Visit: Payer: BLUE CROSS/BLUE SHIELD

## 2017-04-13 ENCOUNTER — Encounter
Admission: RE | Disposition: A | Discharge status: Home / Self Care | Payer: Self-pay | Source: Ambulatory Visit | Attending: Gastroenterology

## 2017-04-13 ENCOUNTER — Telehealth: Payer: Self-pay | Admitting: *Deleted

## 2017-04-13 DIAGNOSIS — G4730 Sleep apnea, unspecified: Secondary | ICD-10-CM | POA: Diagnosis not present

## 2017-04-13 DIAGNOSIS — I10 Essential (primary) hypertension: Secondary | ICD-10-CM | POA: Diagnosis not present

## 2017-04-13 DIAGNOSIS — K8050 Calculus of bile duct without cholangitis or cholecystitis without obstruction: Secondary | ICD-10-CM | POA: Diagnosis not present

## 2017-04-13 DIAGNOSIS — K831 Obstruction of bile duct: Secondary | ICD-10-CM

## 2017-04-13 DIAGNOSIS — R51 Headache: Secondary | ICD-10-CM | POA: Diagnosis not present

## 2017-04-13 DIAGNOSIS — F329 Major depressive disorder, single episode, unspecified: Secondary | ICD-10-CM | POA: Insufficient documentation

## 2017-04-13 DIAGNOSIS — K219 Gastro-esophageal reflux disease without esophagitis: Secondary | ICD-10-CM | POA: Insufficient documentation

## 2017-04-13 DIAGNOSIS — Z79899 Other long term (current) drug therapy: Secondary | ICD-10-CM | POA: Insufficient documentation

## 2017-04-13 DIAGNOSIS — R7303 Prediabetes: Secondary | ICD-10-CM | POA: Insufficient documentation

## 2017-04-13 DIAGNOSIS — Z87891 Personal history of nicotine dependence: Secondary | ICD-10-CM | POA: Diagnosis not present

## 2017-04-13 DIAGNOSIS — F419 Anxiety disorder, unspecified: Secondary | ICD-10-CM | POA: Insufficient documentation

## 2017-04-13 DIAGNOSIS — M1990 Unspecified osteoarthritis, unspecified site: Secondary | ICD-10-CM | POA: Diagnosis not present

## 2017-04-13 DIAGNOSIS — R17 Unspecified jaundice: Secondary | ICD-10-CM | POA: Diagnosis present

## 2017-04-13 HISTORY — PX: ERCP: SHX5425

## 2017-04-13 LAB — POCT PREGNANCY, URINE: Preg Test, Ur: NEGATIVE

## 2017-04-13 SURGERY — ERCP, WITH INTERVENTION IF INDICATED
Anesthesia: General

## 2017-04-13 MED ORDER — SUCCINYLCHOLINE CHLORIDE 20 MG/ML IJ SOLN
INTRAMUSCULAR | Status: DC | PRN
  Administered 2017-04-13: 100 mg via INTRAVENOUS

## 2017-04-13 MED ORDER — DEXAMETHASONE SODIUM PHOSPHATE 10 MG/ML IJ SOLN
INTRAMUSCULAR | Status: AC
  Filled 2017-04-13: qty 1

## 2017-04-13 MED ORDER — SODIUM CHLORIDE 0.9 % IV SOLN
INTRAVENOUS | Status: DC
Start: 2017-04-13 — End: 2017-04-13
  Administered 2017-04-13: 1000 mL via INTRAVENOUS

## 2017-04-13 MED ORDER — MIDAZOLAM HCL 2 MG/2ML IJ SOLN
INTRAMUSCULAR | Status: DC | PRN
  Administered 2017-04-13: 2 mg via INTRAVENOUS

## 2017-04-13 MED ORDER — SODIUM CHLORIDE 0.9 % IV SOLN
INTRAVENOUS | Status: DC
  Administered 2017-04-13: 13:00:00 via INTRAVENOUS

## 2017-04-13 MED ORDER — LIDOCAINE HCL (PF) 2 % IJ SOLN
INTRAMUSCULAR | Status: AC
  Filled 2017-04-13: qty 2

## 2017-04-13 MED ORDER — ONDANSETRON HCL 4 MG/2ML IJ SOLN: 4.0000 mg | Freq: Once | INTRAMUSCULAR | Status: DC | PRN

## 2017-04-13 MED ORDER — SUCCINYLCHOLINE CHLORIDE 20 MG/ML IJ SOLN
INTRAMUSCULAR | Status: AC
  Filled 2017-04-13: qty 1

## 2017-04-13 MED ORDER — ONDANSETRON HCL 4 MG/2ML IJ SOLN
INTRAMUSCULAR | Status: DC | PRN
  Administered 2017-04-13: 4 mg via INTRAVENOUS

## 2017-04-13 MED ORDER — LIDOCAINE HCL (PF) 1 % IJ SOLN
INTRAMUSCULAR | Status: AC
  Filled 2017-04-13: qty 2

## 2017-04-13 MED ORDER — INDOMETHACIN 50 MG RE SUPP: 100.0000 mg | Freq: Once | RECTAL | Status: DC

## 2017-04-13 MED ORDER — SUGAMMADEX SODIUM 200 MG/2ML IV SOLN
INTRAVENOUS | Status: AC
  Filled 2017-04-13: qty 2

## 2017-04-13 MED ORDER — LIDOCAINE HCL (PF) 1 % IJ SOLN
2.0000 mL | Freq: Once | INTRAMUSCULAR | Status: AC
  Administered 2017-04-13: 0.3 mL via INTRADERMAL

## 2017-04-13 MED ORDER — PROPOFOL 10 MG/ML IV BOLUS
INTRAVENOUS | Status: DC | PRN
  Administered 2017-04-13: 250 mg via INTRAVENOUS

## 2017-04-13 MED ORDER — DEXAMETHASONE SODIUM PHOSPHATE 10 MG/ML IJ SOLN
INTRAMUSCULAR | Status: DC | PRN
  Administered 2017-04-13: 10 mg via INTRAVENOUS

## 2017-04-13 MED ORDER — MIDAZOLAM HCL 2 MG/2ML IJ SOLN
INTRAMUSCULAR | Status: AC
  Filled 2017-04-13: qty 2

## 2017-04-13 MED ORDER — INDOMETHACIN 50 MG RE SUPP
RECTAL | Status: AC
  Filled 2017-04-13: qty 2

## 2017-04-13 MED ORDER — FENTANYL CITRATE (PF) 100 MCG/2ML IJ SOLN: 25.0000 ug | INTRAMUSCULAR | Status: DC | PRN

## 2017-04-13 MED ORDER — ROCURONIUM BROMIDE 100 MG/10ML IV SOLN
INTRAVENOUS | Status: DC | PRN
  Administered 2017-04-13: 30 mg via INTRAVENOUS

## 2017-04-13 MED ORDER — ROCURONIUM BROMIDE 50 MG/5ML IV SOLN
INTRAVENOUS | Status: AC
  Filled 2017-04-13: qty 1

## 2017-04-13 MED ORDER — ONDANSETRON HCL 4 MG/2ML IJ SOLN
INTRAMUSCULAR | Status: AC
  Filled 2017-04-13: qty 2

## 2017-04-13 MED ORDER — GLYCOPYRROLATE 0.2 MG/ML IJ SOLN
INTRAMUSCULAR | Status: AC
  Filled 2017-04-13: qty 1

## 2017-04-13 MED ORDER — SUGAMMADEX SODIUM 200 MG/2ML IV SOLN
INTRAVENOUS | Status: DC | PRN
  Administered 2017-04-13: 220 mg via INTRAVENOUS

## 2017-04-13 MED ORDER — INDOMETHACIN 50 MG RE SUPP
RECTAL | Status: DC | PRN
  Administered 2017-04-13: 100 mg via RECTAL

## 2017-04-13 NOTE — H&P (Signed)
Lucilla Lame, MD [facility] [address], [place], [place] [address] Phone:[phone] Fax : [phone]  Primary Care Physician:  Margo Common, [place] Primary Gastroenterologist:  Dr. Allen Norris  Pre-Procedure History & Physical: HPI:  Sandra Wilkins is a 51 y.o. female is here for an ERCP.   Past Medical History:  Diagnosis Date  . Anxiety   . Arthritis    DDD-NECK  . Depression   . GERD (gastroesophageal reflux disease)   . Headache    MIGRAINES  . Hypertension    H/O WAS ON FUROSEMIDE IN THE PAST BUT LOST WEIGHT AND WAS TAKEN OFF DUE TO BP CONTROL  . Pre-diabetes   . Sleep apnea    CPAP    Past Surgical History:  Procedure Laterality Date  . BREAST BIOPSY Right 2012   benign  . TONSILLECTOMY AND ADENOIDECTOMY  1972    Prior to Admission medications   Medication Sig Start Date End Date Taking? Authorizing Provider  ALPRAZolam Duanne Moron) 0.5 MG tablet TAKE 1/2-2 TABLETS BY MOUTH EVERY 4 HOURS AS NEEDED FOR PANIC ATTACKS 02/28/17   Birdie Sons, MD  cetirizine (ZYRTEC) 10 MG tablet Take 10 mg by mouth every morning. NOT TAKING SINCE SHE HAS BEEN SICK WITH GALLBLADDER    [provider]  ciprofloxacin (CIPRO) 500 MG tablet Take 1 tablet (500 mg total) by mouth 2 (two) times daily. 04/11/17 04/16/17  Christene Lye, MD  Ibuprofen 200 MG CAPS Take 400 mg by mouth 2 (two) times daily as needed (PAIN).     [provider]  omeprazole (PRILOSEC) 20 MG capsule TAKE 1 CAPSULE (20 MG TOTAL) BY MOUTH TWICE DAILY 04/11/17   Birdie Sons, MD  sertraline (ZOLOFT) 100 MG tablet Take 1 tablet (100 mg total) by mouth 2 (two) times daily. Patient taking differently: Take 100 mg by mouth at bedtime.  01/28/17   Birdie Sons, MD    Allergies as of 04/11/2017 - Review Complete 04/11/2017  Allergen Reaction Noted  . Codeine Hives 04/16/2015  . Fish oil Other (See Comments) 04/16/2015  . Other Hives and Itching 04/11/2017  . Sulfa antibiotics Hives and  Itching 04/16/2015  . Tramadol Other (See Comments) 04/11/2017  . Penicillins Rash 04/16/2015    Family History  Problem Relation Age of Onset  . Hyperlipidemia Father   . Depression Sister   . Bipolar disorder Brother   . Seizures Brother   . Congestive Heart Failure Maternal Grandfather   . Dementia Paternal Grandfather   . Depression Sister   . Depression Mother   . Arthritis Mother   . Emphysema Mother   . Congestive Heart Failure Mother   . Arthritis Maternal Grandmother   . Asthma Maternal Grandmother     Social History   Social History  . Marital status: Married    Spouse name: N/A  . Number of children: N/A  . Years of education: N/A   Occupational History  . full time    Social History Main Topics  . Smoking status: Former Smoker    Packs/day: 0.50    Years: 20.00    Types: Cigarettes    Quit date: 11/13/2005  . Smokeless tobacco: Never Used  . Alcohol use Yes     Comment: RARE  . Drug use: No  . Sexual activity: Not on file   Other Topics Concern  . Not on file   Social History Narrative  . No narrative on file    Review of  Systems: See HPI, otherwise negative ROS  Physical Exam: BP 140/75   Pulse 77   Temp 98.8 F (37.1 C)   Ht 5' 2.5" (1.588 m)   Wt 245 lb (111.1 kg)   SpO2 97%   BMI 44.10 kg/m  General:   Alert,  pleasant and cooperative in NAD Head:  Normocephalic and atraumatic. Neck:  Supple; no masses or thyromegaly. Lungs:  Clear throughout to auscultation.    Heart:  Regular rate and rhythm. Abdomen:  Soft, nontender and nondistended. Normal bowel sounds, without guarding, and without rebound.   Neurologic:  Alert and  oriented x4;  grossly normal neurologically.  Impression/Plan: Sandra Wilkins is here for an ERCP to be performed for CBD stone  Risks, benefits, limitations, and alternatives regarding  ERCP have been reviewed with the patient.  Questions have been answered.  All parties agreeable.   Lucilla Lame, MD   04/13/2017, 12:34 PM

## 2017-04-13 NOTE — Op Note (Signed)
[facility] Gastroenterology Patient Name: Sandra Wilkins Procedure Date: 04/13/2017 12:28 PM MRN: [id number] Account #: [id number] Date of Birth: 1966-02-25 Admit Type: Inpatient Age: 51 Room: [facility] ENDO ROOM 4 Gender: Female Note Status: Finalized Procedure:            ERCP Indications:          Jaundice, Elevated liver enzymes Providers:            Lucilla Lame MD, MD Referring MD:         Vickki Muff. Chrismon, MD (Referring MD) Medicines:            General Anesthesia Complications:        No immediate complications. Procedure:            Pre-Anesthesia Assessment:                       - Prior to the procedure, a History and Physical was                        performed, and patient medications and allergies were                        reviewed. The patient's tolerance of previous                        anesthesia was also reviewed. The risks and benefits of                        the procedure and the sedation options and risks were                        discussed with the patient. All questions were                        answered, and informed consent was obtained. Prior                        Anticoagulants: The patient has taken no previous                        anticoagulant or antiplatelet agents. ASA Grade                        Assessment: II - A patient with mild systemic disease.                        After reviewing the risks and benefits, the patient was                        deemed in satisfactory condition to undergo the                        procedure.                       After obtaining informed consent, the scope was passed                        under direct vision. Throughout the procedure, the  patient's blood pressure, pulse, and oxygen saturations                        were monitored continuously. The Endosonoscope was                        introduced through the mouth, and used to inject   contrast into and used to inject contrast into the bile                        duct. The ERCP was accomplished without difficulty. The                        patient tolerated the procedure well. Findings:      The scout film was normal. The esophagus was successfully intubated       under direct vision. The scope was advanced to a normal major papilla in       the descending duodenum without detailed examination of the pharynx,       larynx and associated structures, and upper GI tract. The upper GI tract       was grossly normal. The bile duct was deeply cannulated with the       short-nosed traction sphincterotome. Contrast was injected. I personally       interpreted the bile duct images. There was brisk flow of contrast       through the ducts. Image quality was excellent. Contrast extended to the       entire biliary tree. The lower third of the main bile duct contained one       stone, which was 10 mm in diameter. A wire was passed into the biliary       tree. Biliary sphincterotomy was made with a traction (standard)       sphincterotome using ERBE electrocautery. There was no       post-sphincterotomy bleeding. To discover objects, the biliary tree was       swept with a 15 mm balloon starting at the bifurcation. Nothing was       found. One 10 Fr by 7 cm temporary stent with a single external flap and       a single internal flap was placed 5 cm into the common bile duct. Bile       flowed through the stent. The stent was in good position. Impression:           - Choledocholithiasis was found. Partial removal was                        accomplished by biliary sphincterotomy; no stent was                        inserted.                       - A biliary sphincterotomy was performed.                       - The biliary tree was swept and nothing was found.                       - One temporary stent was placed into the common bile  duct. Recommendation:        - Watch for pancreatitis, bleeding, perforation, and                        cholangitis.                       - Repeat ERCP in 2 months to remove stent. Procedure Code(s):    --- Professional ---                       [phone], Endoscopic retrograde cholangiopancreatography                        (ERCP); with placement of endoscopic stent into biliary                        or pancreatic duct, including pre- and post-dilation                        and guide wire passage, when performed, including                        sphincterotomy, when performed, each stent                       [id number], Endoscopic catheterization of the biliary ductal                        system, radiological supervision and interpretation Diagnosis Code(s):    --- Professional ---                       R17, Unspecified jaundice                       R74.8, Abnormal levels of other serum enzymes                       K80.50, Calculus of bile duct without cholangitis or                        cholecystitis without obstruction CPT copyright 2016 American Medical Association. All rights reserved. The codes documented in this report are preliminary and upon coder review may  be revised to meet current compliance requirements. Lucilla Lame MD, MD 04/13/2017 1:32:28 PM This report has been signed electronically. Number of Addenda: 0 Note Initiated On: 04/13/2017 12:28 PM      [facility]

## 2017-04-13 NOTE — Anesthesia Preprocedure Evaluation (Signed)
Anesthesia Evaluation  Patient identified by MRN, date of birth, ID band Patient awake    Reviewed: Allergy & Precautions, NPO status , Patient's Chart, lab work & pertinent test results  History of Anesthesia Complications Negative for: history of anesthetic complications  Airway Mallampati: III       Dental  (+) Loose   Pulmonary sleep apnea and Continuous Positive Airway Pressure Ventilation , former smoker,           Cardiovascular hypertension (borderline, no meds),      Neuro/Psych Anxiety Depression    GI/Hepatic Neg liver ROS, GERD  Medicated and Poorly Controlled,  Endo/Other  negative endocrine ROS  Renal/GU negative Renal ROS     Musculoskeletal   Abdominal   Peds  Hematology   Anesthesia Other Findings   Reproductive/Obstetrics                            Anesthesia Physical Anesthesia Plan  ASA: III  Anesthesia Plan:    Post-op Pain Management:    Induction: Intravenous  Airway Management Planned: Oral ETT  Additional Equipment:   Intra-op Plan:   Post-operative Plan:   Informed Consent: I have reviewed the patients History and Physical, chart, labs and discussed the procedure including the risks, benefits and alternatives for the proposed anesthesia with the patient or authorized representative who has indicated his/her understanding and acceptance.     Plan Discussed with:   Anesthesia Plan Comments:         Anesthesia Quick Evaluation

## 2017-04-13 NOTE — Anesthesia Procedure Notes (Signed)
Procedure Name: Intubation Date/Time: 04/13/2017 12:56 PM Performed by: Jonna Clark Pre-anesthesia Checklist: Patient identified, Patient being monitored, Timeout performed, Emergency Drugs available and Suction available Patient Re-evaluated:Patient Re-evaluated prior to inductionOxygen Delivery Method: Circle system utilized Preoxygenation: Pre-oxygenation with 100% oxygen Intubation Type: IV induction Ventilation: Mask ventilation without difficulty Laryngoscope Size: Mac and 3 Grade View: Grade III Tube type: Oral Tube size: 7.0 mm Number of attempts: 2 Airway Equipment and Method: Stylet Placement Confirmation: ETT inserted through vocal cords under direct vision,  positive ETCO2 and breath sounds checked- equal and bilateral Secured at: 21 cm Tube secured with: Tape Dental Injury: Teeth and Oropharynx as per pre-operative assessment       Performed by  Ulyess Blossom, CRNA. Sandra Wilkins finishing case

## 2017-04-13 NOTE — Telephone Encounter (Signed)
error 

## 2017-04-13 NOTE — Anesthesia Post-op Follow-up Note (Cosign Needed)
Anesthesia QCDR form completed.        

## 2017-04-13 NOTE — Transfer of Care (Signed)
Immediate Anesthesia Transfer of Care Note  Patient: Sandra Wilkins  Procedure(s) Performed: Procedure(s): ENDOSCOPIC RETROGRADE CHOLANGIOPANCREATOGRAPHY (ERCP) (N/A)  Patient Location: PACU  Anesthesia Type:General  Level of Consciousness: patient cooperative and lethargic  Airway & Oxygen Therapy: Patient Spontanous Breathing and Patient connected to face mask oxygen  Post-op Assessment: Report given to RN and Post -op Vital signs reviewed and stable  Post vital signs: Reviewed and stable  Last Vitals:  Vitals:   04/13/17 1230 04/13/17 1349  BP: 140/75 (!) 149/94  Pulse: 77 92  Resp:  (!) 24  Temp:  37.1 C    Last Pain: There were no vitals filed for this visit.       Complications: No apparent anesthesia complications

## 2017-04-13 NOTE — Telephone Encounter (Signed)
Calling to check

## 2017-04-13 NOTE — Telephone Encounter (Signed)
She is home and doing much better, drowsy from the medications. She is aware to call for any new concerns. Plans are to continue with surgery next week.

## 2017-04-13 NOTE — Anesthesia Postprocedure Evaluation (Signed)
Anesthesia Post Note  Patient: Sandra Wilkins  Procedure(s) Performed: Procedure(s) (LRB): ENDOSCOPIC RETROGRADE CHOLANGIOPANCREATOGRAPHY (ERCP) (N/A)  Patient location during evaluation: Endoscopy Anesthesia Type: General Level of consciousness: awake and alert Pain management: pain level controlled Vital Signs Assessment: post-procedure vital signs reviewed and stable Respiratory status: spontaneous breathing and respiratory function stable Cardiovascular status: stable Anesthetic complications: no     Last Vitals:  Vitals:   04/13/17 1419 04/13/17 1434  BP: 131/72 (!) 149/90  Pulse: 81 84  Resp: (!) 25 16  Temp: 37.1 C     Last Pain:  Vitals:   04/13/17 1419  PainSc: 0-No pain                 Julien Berryman K

## 2017-04-14 ENCOUNTER — Encounter: Payer: Self-pay | Admitting: Gastroenterology

## 2017-04-14 ENCOUNTER — Other Ambulatory Visit: Payer: Self-pay | Admitting: Family Medicine

## 2017-04-14 DIAGNOSIS — F410 Panic disorder [episodic paroxysmal anxiety] without agoraphobia: Secondary | ICD-10-CM

## 2017-04-14 NOTE — Telephone Encounter (Signed)
90 day supply-aa

## 2017-04-14 NOTE — Telephone Encounter (Signed)
I haven't seen this patient since 2015. Dr. Caryn Section refilled this in March 2018 for a year. If he thinks this is OK, I will change the refills to 90 days. Patient should schedule a follow up appointment to get re-acquainted if I am going to continue to prescribe for her.

## 2017-04-14 NOTE — Telephone Encounter (Signed)
[facility] faxed a for refill request for a new Rx on the following medications:    sertraline (ZOLOFT) 100 MG tablet.  Take 1 tablet by mouth 2 times daily.  90 day supply.  [facility] [address]

## 2017-04-17 ENCOUNTER — Encounter
Admission: AD | Disposition: A | Discharge status: Home / Self Care | Payer: Self-pay | Source: Ambulatory Visit | Attending: General Surgery

## 2017-04-17 ENCOUNTER — Ambulatory Visit: Payer: BLUE CROSS/BLUE SHIELD

## 2017-04-17 ENCOUNTER — Encounter: Payer: Self-pay | Admitting: *Deleted

## 2017-04-17 ENCOUNTER — Inpatient Hospital Stay
Admission: AD | Admit: 2017-04-17 | Discharge: 2017-04-22 | DRG: 416 | Disposition: A | Discharge status: Home / Self Care | Payer: BLUE CROSS/BLUE SHIELD | Source: Ambulatory Visit | Attending: General Surgery | Admitting: General Surgery

## 2017-04-17 ENCOUNTER — Ambulatory Visit: Payer: BLUE CROSS/BLUE SHIELD | Admitting: Certified Registered Nurse Anesthetist

## 2017-04-17 DIAGNOSIS — Z885 Allergy status to narcotic agent status: Secondary | ICD-10-CM

## 2017-04-17 DIAGNOSIS — R7303 Prediabetes: Secondary | ICD-10-CM | POA: Diagnosis present

## 2017-04-17 DIAGNOSIS — Z79899 Other long term (current) drug therapy: Secondary | ICD-10-CM | POA: Diagnosis not present

## 2017-04-17 DIAGNOSIS — Z882 Allergy status to sulfonamides status: Secondary | ICD-10-CM

## 2017-04-17 DIAGNOSIS — F329 Major depressive disorder, single episode, unspecified: Secondary | ICD-10-CM | POA: Diagnosis present

## 2017-04-17 DIAGNOSIS — Z880 Allergy status to penicillin: Secondary | ICD-10-CM

## 2017-04-17 DIAGNOSIS — Z881 Allergy status to other antibiotic agents status: Secondary | ICD-10-CM

## 2017-04-17 DIAGNOSIS — K8020 Calculus of gallbladder without cholecystitis without obstruction: Secondary | ICD-10-CM

## 2017-04-17 DIAGNOSIS — K8066 Calculus of gallbladder and bile duct with acute and chronic cholecystitis without obstruction: Secondary | ICD-10-CM | POA: Diagnosis present

## 2017-04-17 DIAGNOSIS — K219 Gastro-esophageal reflux disease without esophagitis: Secondary | ICD-10-CM | POA: Diagnosis present

## 2017-04-17 DIAGNOSIS — I10 Essential (primary) hypertension: Secondary | ICD-10-CM | POA: Diagnosis present

## 2017-04-17 DIAGNOSIS — F419 Anxiety disorder, unspecified: Secondary | ICD-10-CM | POA: Diagnosis present

## 2017-04-17 DIAGNOSIS — G4730 Sleep apnea, unspecified: Secondary | ICD-10-CM | POA: Diagnosis present

## 2017-04-17 DIAGNOSIS — R1013 Epigastric pain: Secondary | ICD-10-CM | POA: Diagnosis present

## 2017-04-17 DIAGNOSIS — K9189 Other postprocedural complications and disorders of digestive system: Secondary | ICD-10-CM

## 2017-04-17 DIAGNOSIS — K838 Other specified diseases of biliary tract: Secondary | ICD-10-CM

## 2017-04-17 DIAGNOSIS — K8000 Calculus of gallbladder with acute cholecystitis without obstruction: Secondary | ICD-10-CM | POA: Diagnosis present

## 2017-04-17 DIAGNOSIS — Z87891 Personal history of nicotine dependence: Secondary | ICD-10-CM | POA: Diagnosis not present

## 2017-04-17 HISTORY — PX: CHOLECYSTECTOMY: SHX55

## 2017-04-17 LAB — HEPATIC FUNCTION PANEL
ALK PHOS: 371 U/L — AB (ref 38–126)
ALT: 191 U/L — AB (ref 14–54)
AST: 63 U/L — ABNORMAL HIGH (ref 15–41)
Albumin: 3.6 g/dL (ref 3.5–5.0)
BILIRUBIN TOTAL: 2.5 mg/dL — AB (ref 0.3–1.2)
Bilirubin, Direct: 1 mg/dL — ABNORMAL HIGH (ref 0.1–0.5)
Indirect Bilirubin: 1.5 mg/dL — ABNORMAL HIGH (ref 0.3–0.9)
TOTAL PROTEIN: 7.7 g/dL (ref 6.5–8.1)

## 2017-04-17 SURGERY — CHOLECYSTECTOMY
Anesthesia: General | Wound class: Clean Contaminated

## 2017-04-17 MED ORDER — MIDAZOLAM HCL 2 MG/2ML IJ SOLN
INTRAMUSCULAR | Status: AC
  Filled 2017-04-17: qty 2

## 2017-04-17 MED ORDER — HYDROMORPHONE HCL 1 MG/ML IJ SOLN
INTRAMUSCULAR | Status: AC
  Filled 2017-04-17: qty 1

## 2017-04-17 MED ORDER — HYDROMORPHONE HCL 1 MG/ML IJ SOLN
0.5000 mg | INTRAMUSCULAR | Status: AC | PRN
  Administered 2017-04-17 (×4): 0.5 mg via INTRAVENOUS

## 2017-04-17 MED ORDER — DEXAMETHASONE SODIUM PHOSPHATE 10 MG/ML IJ SOLN
INTRAMUSCULAR | Status: DC | PRN
  Administered 2017-04-17: 10 mg via INTRAVENOUS

## 2017-04-17 MED ORDER — LACTATED RINGERS IV SOLN
INTRAVENOUS | Status: DC
  Administered 2017-04-17 (×2): via INTRAVENOUS

## 2017-04-17 MED ORDER — ENOXAPARIN SODIUM 40 MG/0.4ML ~~LOC~~ SOLN
40.0000 mg | Freq: Two times a day (BID) | SUBCUTANEOUS | Status: DC
  Administered 2017-04-18 – 2017-04-22 (×9): 40 mg via SUBCUTANEOUS
  Filled 2017-04-17 (×9): qty 0.4

## 2017-04-17 MED ORDER — SODIUM CHLORIDE 0.9 % IJ SOLN
INTRAMUSCULAR | Status: AC
  Filled 2017-04-17: qty 50

## 2017-04-17 MED ORDER — FENTANYL CITRATE (PF) 100 MCG/2ML IJ SOLN
25.0000 ug | INTRAMUSCULAR | Status: AC | PRN
  Administered 2017-04-17 (×6): 25 ug via INTRAVENOUS

## 2017-04-17 MED ORDER — FENTANYL CITRATE (PF) 100 MCG/2ML IJ SOLN
INTRAMUSCULAR | Status: AC
  Filled 2017-04-17: qty 2

## 2017-04-17 MED ORDER — LIDOCAINE HCL (CARDIAC) 20 MG/ML IV SOLN
INTRAVENOUS | Status: DC | PRN
  Administered 2017-04-17: 100 mg via INTRAVENOUS

## 2017-04-17 MED ORDER — ACETAMINOPHEN 10 MG/ML IV SOLN
1000.0000 mg | Freq: Four times a day (QID) | INTRAVENOUS | Status: AC
  Administered 2017-04-17 – 2017-04-18 (×4): 1000 mg via INTRAVENOUS
  Filled 2017-04-17 (×5): qty 100

## 2017-04-17 MED ORDER — ROCURONIUM BROMIDE 50 MG/5ML IV SOLN
INTRAVENOUS | Status: AC
  Filled 2017-04-17: qty 1

## 2017-04-17 MED ORDER — MORPHINE SULFATE (PF) 2 MG/ML IV SOLN
2.0000 mg | INTRAVENOUS | Status: DC | PRN
  Administered 2017-04-17 – 2017-04-20 (×9): 2 mg via INTRAVENOUS
  Filled 2017-04-17 (×9): qty 1

## 2017-04-17 MED ORDER — PANTOPRAZOLE SODIUM 40 MG PO TBEC
40.0000 mg | DELAYED_RELEASE_TABLET | Freq: Every day | ORAL | Status: DC
  Administered 2017-04-17 – 2017-04-22 (×6): 40 mg via ORAL
  Filled 2017-04-17 (×6): qty 1

## 2017-04-17 MED ORDER — PROPOFOL 10 MG/ML IV BOLUS
INTRAVENOUS | Status: AC
  Filled 2017-04-17: qty 20

## 2017-04-17 MED ORDER — ACETAMINOPHEN 10 MG/ML IV SOLN
INTRAVENOUS | Status: AC
  Filled 2017-04-17: qty 100

## 2017-04-17 MED ORDER — ROCURONIUM BROMIDE 100 MG/10ML IV SOLN
INTRAVENOUS | Status: DC | PRN
  Administered 2017-04-17: 30 mg via INTRAVENOUS
  Administered 2017-04-17: 10 mg via INTRAVENOUS
  Administered 2017-04-17 (×2): 20 mg via INTRAVENOUS
  Administered 2017-04-17 (×2): 10 mg via INTRAVENOUS

## 2017-04-17 MED ORDER — BUPIVACAINE-EPINEPHRINE (PF) 0.5% -1:200000 IJ SOLN
INTRAMUSCULAR | Status: AC
  Filled 2017-04-17: qty 30

## 2017-04-17 MED ORDER — FENTANYL CITRATE (PF) 100 MCG/2ML IJ SOLN
50.0000 ug | Freq: Once | INTRAMUSCULAR | Status: AC
  Administered 2017-04-17: 50 ug via INTRAVENOUS

## 2017-04-17 MED ORDER — ONDANSETRON HCL 4 MG/2ML IJ SOLN
INTRAMUSCULAR | Status: DC | PRN
  Administered 2017-04-17: 4 mg via INTRAVENOUS

## 2017-04-17 MED ORDER — SERTRALINE HCL 100 MG PO TABS: 100.0000 mg | ORAL_TABLET | Freq: Two times a day (BID) | ORAL | 12 refills | Status: DC

## 2017-04-17 MED ORDER — LIDOCAINE HCL (PF) 2 % IJ SOLN
INTRAMUSCULAR | Status: AC
  Filled 2017-04-17: qty 2

## 2017-04-17 MED ORDER — ALPRAZOLAM 0.5 MG PO TABS
0.5000 mg | ORAL_TABLET | Freq: Three times a day (TID) | ORAL | Status: DC | PRN
  Administered 2017-04-19: 0.5 mg via ORAL
  Filled 2017-04-17: qty 1

## 2017-04-17 MED ORDER — MIDAZOLAM HCL 2 MG/2ML IJ SOLN
INTRAMUSCULAR | Status: DC | PRN
  Administered 2017-04-17: 2 mg via INTRAVENOUS

## 2017-04-17 MED ORDER — SERTRALINE HCL 100 MG PO TABS
100.0000 mg | ORAL_TABLET | Freq: Two times a day (BID) | ORAL | Status: DC
  Administered 2017-04-17 – 2017-04-22 (×10): 100 mg via ORAL
  Filled 2017-04-17 (×10): qty 1

## 2017-04-17 MED ORDER — DIPHENHYDRAMINE HCL 12.5 MG/5ML PO ELIX: 12.5000 mg | ORAL_SOLUTION | ORAL | Status: DC | PRN

## 2017-04-17 MED ORDER — BUPIVACAINE-EPINEPHRINE (PF) 0.5% -1:200000 IJ SOLN
INTRAMUSCULAR | Status: DC | PRN
  Administered 2017-04-17: 30 mL

## 2017-04-17 MED ORDER — CEFAZOLIN SODIUM-DEXTROSE 2-3 GM-% IV SOLR
INTRAVENOUS | Status: DC | PRN
  Administered 2017-04-17: 2 g via INTRAVENOUS

## 2017-04-17 MED ORDER — PROMETHAZINE HCL 25 MG/ML IJ SOLN: 6.2500 mg | INTRAMUSCULAR | Status: DC | PRN

## 2017-04-17 MED ORDER — DIPHENHYDRAMINE HCL 50 MG/ML IJ SOLN: 12.5000 mg | INTRAMUSCULAR | Status: DC | PRN

## 2017-04-17 MED ORDER — FENTANYL CITRATE (PF) 100 MCG/2ML IJ SOLN
INTRAMUSCULAR | Status: DC | PRN
  Administered 2017-04-17 (×6): 50 ug via INTRAVENOUS

## 2017-04-17 MED ORDER — SUCCINYLCHOLINE CHLORIDE 20 MG/ML IJ SOLN
INTRAMUSCULAR | Status: DC | PRN
  Administered 2017-04-17: 120 mg via INTRAVENOUS

## 2017-04-17 MED ORDER — PROPOFOL 10 MG/ML IV BOLUS
INTRAVENOUS | Status: DC | PRN
  Administered 2017-04-17: 200 mg via INTRAVENOUS

## 2017-04-17 MED ORDER — SUGAMMADEX SODIUM 200 MG/2ML IV SOLN
INTRAVENOUS | Status: DC | PRN
Start: 2017-04-17 — End: 2017-04-17
  Administered 2017-04-17: 222.2 mg via INTRAVENOUS

## 2017-04-17 MED ORDER — OXYCODONE HCL 5 MG PO TABS
5.0000 mg | ORAL_TABLET | ORAL | Status: DC | PRN
  Administered 2017-04-17 – 2017-04-18 (×2): 5 mg via ORAL
  Administered 2017-04-19 – 2017-04-22 (×11): 10 mg via ORAL
  Filled 2017-04-17 (×7): qty 2
  Filled 2017-04-17: qty 10
  Filled 2017-04-17: qty 1
  Filled 2017-04-17: qty 2
  Filled 2017-04-17: qty 1
  Filled 2017-04-17 (×2): qty 2

## 2017-04-17 MED ORDER — DEXTROSE IN LACTATED RINGERS 5 % IV SOLN
INTRAVENOUS | Status: DC
  Administered 2017-04-17 – 2017-04-20 (×6): via INTRAVENOUS

## 2017-04-17 SURGICAL SUPPLY — 63 items
APPLIER CLIP 11 MED OPEN (CLIP) ×3
APPLIER CLIP ROT 10 11.4 M/L (STAPLE) ×3
BLADE SURG 11 STRL SS SAFETY (MISCELLANEOUS) ×3 IMPLANT
BULB RESERV EVAC DRAIN JP 100C (MISCELLANEOUS) ×3 IMPLANT
CANISTER SUCT 1200ML W/VALVE (MISCELLANEOUS) ×3 IMPLANT
CANNULA DILATOR 10 W/SLV (CANNULA) ×3 IMPLANT
CANNULA DILATOR 5 W/SLV (CANNULA) ×9 IMPLANT
CATH CHOLANG 76X19 KUMAR (CATHETERS) ×3 IMPLANT
CHLORAPREP W/TINT 26ML (MISCELLANEOUS) ×3 IMPLANT
CLIP APPLIE 11 MED OPEN (CLIP) ×2 IMPLANT
CLIP APPLIE ROT 10 11.4 M/L (STAPLE) ×2 IMPLANT
CONRAY 60ML FOR OR (MISCELLANEOUS) ×3 IMPLANT
DISSECTOR KITTNER STICK (MISCELLANEOUS) ×2 IMPLANT
DISSECTORS/KITTNER STICK (MISCELLANEOUS) ×3
DRAIN CHANNEL JP 15F RND 16 (MISCELLANEOUS) ×3 IMPLANT
DRAIN PENROSE 1/4X12 LTX (DRAIN) ×3 IMPLANT
DRAPE SHEET LG 3/4 BI-LAMINATE (DRAPES) ×3 IMPLANT
DRSG OPSITE POSTOP 4X8 (GAUZE/BANDAGES/DRESSINGS) ×3 IMPLANT
DRSG TEGADERM 2-3/8X2-3/4 SM (GAUZE/BANDAGES/DRESSINGS) ×12 IMPLANT
DRSG TELFA 4X3 1S NADH ST (GAUZE/BANDAGES/DRESSINGS) ×3 IMPLANT
ELECT BLADE 6.5 EXT (BLADE) ×3 IMPLANT
ELECT CAUTERY BLADE 6.4 (BLADE) ×3 IMPLANT
ELECT REM PT RETURN 9FT ADLT (ELECTROSURGICAL) ×3
ELECTRODE REM PT RTRN 9FT ADLT (ELECTROSURGICAL) ×2 IMPLANT
ENDOPOUCH RETRIEVER 10 (MISCELLANEOUS) IMPLANT
GLOVE BIO SURGEON STRL SZ7.5 (GLOVE) ×12 IMPLANT
GLOVE INDICATOR 8.0 STRL GRN (GLOVE) ×9 IMPLANT
GOWN STRL REUS W/ TWL LRG LVL3 (GOWN DISPOSABLE) ×6 IMPLANT
GOWN STRL REUS W/TWL LRG LVL3 (GOWN DISPOSABLE) ×3
HANDLE YANKAUER SUCT BULB TIP (MISCELLANEOUS) ×3 IMPLANT
HEMOSTAT SURGICEL 2X14 (HEMOSTASIS) ×3 IMPLANT
IRRIGATION STRYKERFLOW (MISCELLANEOUS) ×2 IMPLANT
IRRIGATOR STRYKERFLOW (MISCELLANEOUS) ×3
IV LACTATED RINGERS 1000ML (IV SOLUTION) ×3 IMPLANT
KIT RM TURNOVER STRD PROC AR (KITS) ×3 IMPLANT
LABEL OR SOLS (LABEL) ×3 IMPLANT
NDL INSUFF ACCESS 14 VERSASTEP (NEEDLE) ×3 IMPLANT
NEEDLE HYPO 22GX1.5 SAFETY (NEEDLE) ×3 IMPLANT
NS IRRIG 500ML POUR BTL (IV SOLUTION) ×3 IMPLANT
PACK LAP CHOLECYSTECTOMY (MISCELLANEOUS) ×3 IMPLANT
PENCIL ELECTRO HAND CTR (MISCELLANEOUS) ×3 IMPLANT
SCISSORS METZENBAUM CVD 33 (INSTRUMENTS) ×3 IMPLANT
SEAL FOR SCOPE WARMER C3101 (MISCELLANEOUS) IMPLANT
SPONGE KITTNER 5P (MISCELLANEOUS) ×9 IMPLANT
SPONGE LAP 18X18 5 PK (GAUZE/BANDAGES/DRESSINGS) ×6 IMPLANT
STAPLER SKIN PROX 35W (STAPLE) ×3 IMPLANT
STRIP CLOSURE SKIN 1/2X4 (GAUZE/BANDAGES/DRESSINGS) ×3 IMPLANT
SUT CHROMIC 3 0 SH 27 (SUTURE) ×3 IMPLANT
SUT ETHILON 3-0 FS-10 30 BLK (SUTURE) ×3
SUT PDS AB 0 CT1 27 (SUTURE) ×9 IMPLANT
SUT SILK 3-0 (SUTURE) ×1
SUT SILK 3-0 SH-1 18XCR BRD (SUTURE) ×2
SUT VIC AB 0 CT2 27 (SUTURE) ×3 IMPLANT
SUT VIC AB 2-0 BRD 54 (SUTURE) ×3 IMPLANT
SUT VIC AB 2-0 CT1 27 (SUTURE) ×3
SUT VIC AB 2-0 CT1 TAPERPNT 27 (SUTURE) ×6 IMPLANT
SUT VIC AB 4-0 FS2 27 (SUTURE) ×3 IMPLANT
SUTURE EHLN 3-0 FS-10 30 BLK (SUTURE) ×2 IMPLANT
SUTURE SILK 3-0 SH-1 18XCR BRD (SUTURE) ×2 IMPLANT
SWABSTK COMLB BENZOIN TINCTURE (MISCELLANEOUS) ×3 IMPLANT
TROCAR XCEL NON-BLD 11X100MML (ENDOMECHANICALS) ×3 IMPLANT
TUBING INSUFFLATOR HI FLOW (MISCELLANEOUS) ×3 IMPLANT
WATER STERILE IRR 1000ML POUR (IV SOLUTION) ×3 IMPLANT

## 2017-04-17 NOTE — Telephone Encounter (Signed)
Please review Dr Caryn Section. Looks like you have seen her since Dr Venia Minks left, for acutes?-aa

## 2017-04-17 NOTE — H&P (Signed)
Sandra Wilkins [id number] 1966/01/21     HPI: Events of last week reviewed.  Patient presented with jaundice and underwent ERCP with stent placement. The 10 mm stone could not be removed. Since then, jaundice is fading, but urine still dark.    Prescriptions Prior to Admission  Medication Sig Dispense Refill Last Dose  . ALPRAZolam (XANAX) 0.5 MG tablet TAKE 1/2-2 TABLETS BY MOUTH EVERY 4 HOURS AS NEEDED FOR PANIC ATTACKS 30 tablet 3 04/16/2017 at Unknown time  . cetirizine (ZYRTEC) 10 MG tablet Take 10 mg by mouth every morning. NOT TAKING SINCE SHE HAS BEEN SICK WITH GALLBLADDER   Past Month at Unknown time  . Ibuprofen 200 MG CAPS Take 400 mg by mouth 2 (two) times daily as needed (PAIN).    Past Week at Unknown time  . omeprazole (PRILOSEC) 20 MG capsule TAKE 1 CAPSULE (20 MG TOTAL) BY MOUTH TWICE DAILY 60 capsule 11 04/17/2017 at 0800  . sertraline (ZOLOFT) 100 MG tablet Take 1 tablet (100 mg total) by mouth 2 (two) times daily. (Patient taking differently: Take 100 mg by mouth at bedtime. ) 60 tablet 12 04/16/2017 at Unknown time   Allergies  Allergen Reactions  . Codeine Hives  . Fish Oil Other (See Comments)    Indigestion Indigestion  . Other Hives and Itching    Metronidazole or Ciprofloxacin    . Sulfa Antibiotics Hives and Itching  . Tramadol Other (See Comments)    Hallucinations  . Penicillins Rash    Has patient had a PCN reaction causing immediate rash, facial/tongue/throat swelling, SOB or lightheadedness with hypotension: Unknown Has patient had a PCN reaction causing severe rash involving mucus membranes or skin necrosis: Unknown Has patient had a PCN reaction that required hospitalization: Unknown Has patient had a PCN reaction occurring within the last 10 years: No If all of the above answers are "NO", then may proceed with Cephalosporin use.    Past Medical History:  Diagnosis Date  . Anxiety   . Arthritis    DDD-NECK  . Depression   . GERD (gastroesophageal  reflux disease)   . Headache    MIGRAINES  . Hypertension    H/O WAS ON FUROSEMIDE IN THE PAST BUT LOST WEIGHT AND WAS TAKEN OFF DUE TO BP CONTROL  . Pre-diabetes   . Sleep apnea    CPAP   Past Surgical History:  Procedure Laterality Date  . BREAST BIOPSY Right 2012   benign  . ERCP N/A 04/13/2017   Procedure: ENDOSCOPIC RETROGRADE CHOLANGIOPANCREATOGRAPHY (ERCP);  Surgeon: Lucilla Lame, MD;  Location: [facility] ENDOSCOPY;  Service: Endoscopy;  Laterality: N/A;  . TONSILLECTOMY AND ADENOIDECTOMY  1972   Social History   Social History  . Marital status: Married    Spouse name: N/A  . Number of children: N/A  . Years of education: N/A   Occupational History  . full time    Social History Main Topics  . Smoking status: Former Smoker    Packs/day: 0.50    Years: 20.00    Types: Cigarettes    Quit date: 11/13/2005  . Smokeless tobacco: Never Used  . Alcohol use Yes     Comment: RARE  . Drug use: No  . Sexual activity: Not on file   Other Topics Concern  . Not on file   Social History Narrative  . No narrative on file   Social History   Social History Narrative  . No narrative on file     ROS:  Negative.     PE: HEENT: Slight residual scleral icterus.  HEENT: Negative. Lungs: Clear. Cardio: RR.    Assessment/Plan:  Proceed with planned cholecystectomy.  Robert Bellow 04/17/2017

## 2017-04-17 NOTE — Anesthesia Preprocedure Evaluation (Signed)
Anesthesia Evaluation  Patient identified by MRN, date of birth, ID band Patient awake    Reviewed: Allergy & Precautions, NPO status , Patient's Chart, lab work & pertinent test results  History of Anesthesia Complications Negative for: history of anesthetic complications  Airway Mallampati: III       Dental  (+) Loose   Pulmonary neg shortness of breath, sleep apnea and Continuous Positive Airway Pressure Ventilation , neg COPD, neg recent URI, former smoker,           Cardiovascular Exercise Tolerance: Good hypertension, (-) angina(-) CAD, (-) Past MI, (-) Cardiac Stents and (-) CABG (-) dysrhythmias (-) Valvular Problems/Murmurs     Neuro/Psych PSYCHIATRIC DISORDERS (Depression and anxiety) negative neurological ROS     GI/Hepatic Neg liver ROS, GERD  Medicated and Poorly Controlled,  Endo/Other  neg diabetesMorbid obesity  Renal/GU negative Renal ROS  negative genitourinary   Musculoskeletal   Abdominal   Peds  Hematology negative hematology ROS (+)   Anesthesia Other Findings Past Medical History: No date: Anxiety No date: Arthritis     Comment: DDD-NECK No date: Depression No date: GERD (gastroesophageal reflux disease) No date: Headache     Comment: MIGRAINES No date: Hypertension     Comment: H/O WAS ON FUROSEMIDE IN THE PAST BUT LOST               WEIGHT AND WAS TAKEN OFF DUE TO BP CONTROL No date: Pre-diabetes No date: Sleep apnea     Comment: CPAP   Reproductive/Obstetrics negative OB ROS                             Anesthesia Physical  Anesthesia Plan  ASA: III  Anesthesia Plan: General ETT   Post-op Pain Management:    Induction: Intravenous  Airway Management Planned: Oral ETT  Additional Equipment:   Intra-op Plan:   Post-operative Plan:   Informed Consent: I have reviewed the patients History and Physical, chart, labs and discussed the procedure  including the risks, benefits and alternatives for the proposed anesthesia with the patient or authorized representative who has indicated his/her understanding and acceptance.     Plan Discussed with:   Anesthesia Plan Comments:         Anesthesia Quick Evaluation

## 2017-04-17 NOTE — Anesthesia Post-op Follow-up Note (Cosign Needed)
Anesthesia QCDR form completed.        

## 2017-04-17 NOTE — Anesthesia Procedure Notes (Signed)
Procedure Name: Intubation Performed by: Demetrius Charity Pre-anesthesia Checklist: Patient identified, Patient being monitored, Timeout performed, Emergency Drugs available and Suction available Patient Re-evaluated:Patient Re-evaluated prior to inductionOxygen Delivery Method: Circle system utilized Preoxygenation: Pre-oxygenation with 100% oxygen Intubation Type: IV induction Ventilation: Mask ventilation without difficulty Laryngoscope Size: Glidescope and 4 Grade View: Grade II Tube type: Oral Tube size: 7.0 mm Number of attempts: 1 Airway Equipment and Method: Video-laryngoscopy and Rigid stylet Placement Confirmation: ETT inserted through vocal cords under direct vision,  positive ETCO2 and breath sounds checked- equal and bilateral Secured at: 21 cm Tube secured with: Tape Dental Injury: Teeth and Oropharynx as per pre-operative assessment

## 2017-04-17 NOTE — Progress Notes (Signed)
Anticoagulation monitoring(Lovenox):  51yo  female ordered Lovenox 40 mg Q24h  Filed Weights   04/17/17 0947  Weight: 245 lb (111.1 kg)   BMI 45.1  Lab Results  Component Value Date   CREATININE 1.07 (H) 03/29/2017   CREATININE 1.00 07/22/2016   CREATININE 0.8 04/15/2015   Estimated Creatinine Clearance: 74.8 mL/min (A) (by C-G formula based on SCr of 1.07 mg/dL (H)). Hemoglobin & Hematocrit     Component Value Date/Time   HGB 14.9 04/11/2017 1224   HCT 43.8 04/11/2017 1224   HCT 42.2 03/29/2017 1651     Per Protocol for Patient with estCrcl > 30 ml/min and BMI > 40, will transition to Lovenox 40 mg Q12h.     Olivia Canter, [facility] 04/17/17, 4:04 PM

## 2017-04-17 NOTE — Plan of Care (Signed)
Problem: Activity: Goal: Ability to tolerate increased activity will improve Outcome: Progressing Patient encouraged to ambulate early  Problem: Pain Management: Goal: Pain level will decrease Outcome: Progressing Patient is resting quietly.

## 2017-04-17 NOTE — Transfer of Care (Signed)
Immediate Anesthesia Transfer of Care Note  Patient: Sandra Wilkins  Procedure(s) Performed: Procedure(s) with comments: CHOLECYSTECTOMY - Laparoscopic converted to open  Patient Location: PACU  Anesthesia Type:General  Level of Consciousness: awake and alert   Airway & Oxygen Therapy: Patient Spontanous Breathing and Patient connected to face mask oxygen  Post-op Assessment: Report given to RN and Post -op Vital signs reviewed and stable  Post vital signs: Reviewed and stable  Last Vitals:  Vitals:   04/17/17 0947  BP: 127/88  Pulse: 82  Resp: 16  Temp: 36.8 C    Last Pain:  Vitals:   04/17/17 0947  TempSrc: Oral  PainSc: 1          Complications: No apparent anesthesia complications

## 2017-04-17 NOTE — Op Note (Addendum)
Preoperative diagnosis: Cholecystitis and cholelithiasis, history, and bile duct stone status post ERCP.  Postoperative diagnosis: Acute and chronic cholecystitis, cholelithiasis.  Procedure: Attempted laparoscopic cholecystectomy, open cholecystectomy.  Surgeon: Hervey Ard, M.D.  Asst: S.G.Jamal Collin, M.D.  Anesthesia: General endo. Marcaine 0.5% with 1-200,000 of epinephrine, 30 mL  EBL: 300 cc.  Fluids: 1400 cc.  Note: This 22 year woman has had a 3-4 week history of abdominal pain. Last week she presented with choledocholithiasis and underwent ERCP and stent placement. The patient was admitted today with a falling bilirubin for planned laparoscopic cholecystectomy.  Operative note: The patient tolerated general endotracheal anesthesia well. In light of last week's ERCP she was given Kefzol 2 g intravenously. The abdomen was prepped with ChloraPrep and draped. An Trendelenburg position a varies needle was placed trans-umbilical incision. After showing an abdominal occasion with pain drop test the abdomen was insufflated with CO2 at 10 mmHg pressure. The patient was placed in reverse Trendelenburg position and rolled to the left. An 11 mm XL port was placed in the epigastrium. 2-5 mm Step ports were placed laterally. Marked inflammation was noted the gallbladder. It was exceptionally difficult to grasp. Attempted decompression was unsuccessful due to multiple stones. The adherent omentum was swept inferiorly. A additional 5 mm Depth port was placed in the right anterior axillary line and a Peer retractor used to distract the duodenum and colon inferiorly and medially. A tenuous dissection was undertaken at which point of the gallbladder body was circled with a Penrose drain. Further attempts to dissect the neck of the gallbladder resulted in bleeding subsequently identified as a posterior branch of the cystic artery. This was held with pressure followed by placement of a 4 x 4 sponge. It was  elected to open at this time. Dr. Jamal Collin scrubbed in at this time.   A generous right subcostal incision was made and carried down through skin and subcutaneous tissue with hemostasis achieved by electrocautery. The fascia of the rectus muscle was opened with cutting current and the muscle divided with coag and current. The posterior rectus sheath was opened. The bleeding site was found to have stopped and it was elected to take the gallbladder down in a reverse fashion from the fundus to the neck. This was done with marked thickening of all bladder wall noted. The gallbladder was entered and multiple stones were extracted. No stones were lost. The bleeding site was found to be the posterior branch of the cystic artery and this was doubly clipped and divided. The area of the portal triad was tremendously inflamed and it was elected not to proceed with a common duct dissection in light of the fact that the previously placed biliary stent was functional and it was anticipated that the retained stone from last week's ERCP could be extracted in the future. The neck of the gallbladder was tied with a tie 2. Final hemostasis was achieved with cautery. A sheet of Surgicel was laid along the gallbladder bed. The abdomen was irrigated with warm saline. A 15 [place] Blake drain was brought out through the superior lateral port site incision and anchored in position with 3-0 nylon. The peritoneum was closed with a running 2-0 Vicryl suture. The posterior rectus sheath was closed in a similar fashion. The anterior rectus sheath was closed with interrupted 0 PDS sutures. The adipose layer was approximated with a running 2-0 Vicryl suture. The skin was closed with staples followed by placement of a honeycomb dressing. [place] site was closed with 4-0  Vicryl sutures followed by a benzoin Steri-Strips with Telfa and Tegaderm. The Blake drain was placed to self suction. The patient was taken to recovery in stable condition.

## 2017-04-18 ENCOUNTER — Encounter: Payer: Self-pay | Admitting: General Surgery

## 2017-04-18 LAB — CBC
HCT: 35.4 % (ref 35.0–47.0)
Hemoglobin: 11.8 g/dL — ABNORMAL LOW (ref 12.0–16.0)
MCH: 28.6 pg (ref 26.0–34.0)
MCHC: 33.2 g/dL (ref 32.0–36.0)
MCV: 86.2 fL (ref 80.0–100.0)
PLATELETS: 247 10*3/uL (ref 150–440)
RBC: 4.11 MIL/uL (ref 3.80–5.20)
RDW: 15.2 % — AB (ref 11.5–14.5)
WBC: 9.9 10*3/uL (ref 3.6–11.0)

## 2017-04-18 LAB — HEPATIC FUNCTION PANEL
ALBUMIN: 3.1 g/dL — AB (ref 3.5–5.0)
ALT: 170 U/L — ABNORMAL HIGH (ref 14–54)
AST: 81 U/L — AB (ref 15–41)
Alkaline Phosphatase: 277 U/L — ABNORMAL HIGH (ref 38–126)
BILIRUBIN DIRECT: 0.7 mg/dL — AB (ref 0.1–0.5)
Indirect Bilirubin: 1.3 mg/dL — ABNORMAL HIGH (ref 0.3–0.9)
Total Bilirubin: 2 mg/dL — ABNORMAL HIGH (ref 0.3–1.2)
Total Protein: 6.4 g/dL — ABNORMAL LOW (ref 6.5–8.1)

## 2017-04-18 LAB — CREATININE, SERUM
Creatinine, Ser: 0.67 mg/dL (ref 0.44–1.00)
GFR calc Af Amer: >60 mL/min (ref >60)

## 2017-04-18 MED ORDER — ACETAMINOPHEN 325 MG PO TABS
650.0000 mg | ORAL_TABLET | ORAL | Status: DC
  Administered 2017-04-18 – 2017-04-22 (×17): 650 mg via ORAL
  Filled 2017-04-18 (×18): qty 2

## 2017-04-18 NOTE — Anesthesia Postprocedure Evaluation (Signed)
Anesthesia Post Note  Patient: Sandra Wilkins  Procedure(s) Performed: Procedure(s): CHOLECYSTECTOMY  Patient location during evaluation: PACU Anesthesia Type: General Level of consciousness: awake and alert Pain management: pain level controlled Vital Signs Assessment: post-procedure vital signs reviewed and stable Respiratory status: spontaneous breathing, nonlabored ventilation, respiratory function stable and patient connected to nasal cannula oxygen Cardiovascular status: blood pressure returned to baseline and stable Postop Assessment: no signs of nausea or vomiting Anesthetic complications: no     Last Vitals:  Vitals:   04/18/17 1554 04/18/17 2019  BP: 134/78 130/80  Pulse: 79 77  Resp: (!) 22 19  Temp: 36.7 C 36.8 C    Last Pain:  Vitals:   04/18/17 2019  TempSrc: Oral  PainSc:                  Martha Clan

## 2017-04-18 NOTE — Plan of Care (Signed)
Problem: Fluid Volume: Goal: Ability to maintain a balanced intake and output will improve Outcome: Adequate for Discharge Tolerating po's and voiding well;

## 2017-04-18 NOTE — Progress Notes (Signed)
AVSS. Pain manageable. Small amount of clears taken, not ready for solid food. Lungs: Clear. Cardio: RR. ABD: Non-distended, good BS. Dressing: Nearly saturated with old blood. Drain: Old blood. Labs: LFT's continue to improve. HGB down from 14 to 11. Normal WBC. IMP: Doing well. Plan: Ambulate, advance to full liquids.

## 2017-04-19 LAB — CBC
HEMATOCRIT: 35 % (ref 35.0–47.0)
HEMOGLOBIN: 11.7 g/dL — AB (ref 12.0–16.0)
MCH: 29.3 pg (ref 26.0–34.0)
MCHC: 33.3 g/dL (ref 32.0–36.0)
MCV: 88.1 fL (ref 80.0–100.0)
Platelets: 251 10*3/uL (ref 150–440)
RBC: 3.98 MIL/uL (ref 3.80–5.20)
RDW: 15.1 % — ABNORMAL HIGH (ref 11.5–14.5)
WBC: 8.6 10*3/uL (ref 3.6–11.0)

## 2017-04-19 LAB — SURGICAL PATHOLOGY

## 2017-04-19 NOTE — Progress Notes (Signed)
AVSS.  Has yet to ambulate in the hall. Incentive spirometer at 750 with encouragement. Lungs: Clear. Sat 94% yesterday PM.  Cardio: RR. ABD: Soft, BS+.  Dressings: Dry today. JP: 90 cc last PM. ? Faint bile tinge today. Nausea last PM, none today. Happy w/ full liquids. Plan: Ambulate, work on pulmonary function.  Recheck labs. HIDA tomorrow if bile drainage continues. (May be from liver bed after chiseling gallbladder out.)

## 2017-04-20 ENCOUNTER — Encounter: Payer: Self-pay | Admitting: Radiology

## 2017-04-20 ENCOUNTER — Inpatient Hospital Stay: Payer: BLUE CROSS/BLUE SHIELD

## 2017-04-20 LAB — CBC WITH DIFFERENTIAL/PLATELET
BASOS ABS: 0.1 10*3/uL (ref 0–0.1)
Basophils Relative: 1 %
EOS PCT: 3 %
Eosinophils Absolute: 0.3 10*3/uL (ref 0–0.7)
HEMATOCRIT: 33.9 % — AB (ref 35.0–47.0)
Hemoglobin: 11.3 g/dL — ABNORMAL LOW (ref 12.0–16.0)
LYMPHS ABS: 2.4 10*3/uL (ref 1.0–3.6)
LYMPHS PCT: 23 %
MCH: 28.7 pg (ref 26.0–34.0)
MCHC: 33.3 g/dL (ref 32.0–36.0)
MCV: 86.2 fL (ref 80.0–100.0)
MONO ABS: 1.1 10*3/uL — AB (ref 0.2–0.9)
Monocytes Relative: 10 %
NEUTROS ABS: 6.6 10*3/uL — AB (ref 1.4–6.5)
Neutrophils Relative %: 63 %
PLATELETS: 255 10*3/uL (ref 150–440)
RBC: 3.94 MIL/uL (ref 3.80–5.20)
RDW: 15 % — AB (ref 11.5–14.5)
WBC: 10.4 10*3/uL (ref 3.6–11.0)

## 2017-04-20 LAB — COMPREHENSIVE METABOLIC PANEL
ALT: 98 U/L — ABNORMAL HIGH (ref 14–54)
AST: 36 U/L (ref 15–41)
Albumin: 3 g/dL — ABNORMAL LOW (ref 3.5–5.0)
Alkaline Phosphatase: 225 U/L — ABNORMAL HIGH (ref 38–126)
Anion gap: 8 (ref 5–15)
BILIRUBIN TOTAL: 1.6 mg/dL — AB (ref 0.3–1.2)
BUN: 9 mg/dL (ref 6–20)
CO2: 30 mmol/L (ref 22–32)
CREATININE: 0.77 mg/dL (ref 0.44–1.00)
Calcium: 8.6 mg/dL — ABNORMAL LOW (ref 8.9–10.3)
Chloride: 102 mmol/L (ref 101–111)
Glucose, Bld: 106 mg/dL — ABNORMAL HIGH (ref 65–99)
POTASSIUM: 3.5 mmol/L (ref 3.5–5.1)
Sodium: 140 mmol/L (ref 135–145)
Total Protein: 6.3 g/dL — ABNORMAL LOW (ref 6.5–8.1)

## 2017-04-20 LAB — BILIRUBIN, DIRECT: BILIRUBIN DIRECT: 0.6 mg/dL — AB (ref 0.1–0.5)

## 2017-04-20 IMAGING — NM NM HEPATOBILIARY IMAGE, INC GB
1 series · 6 of 6 positions shown · non-contrast
Comparison: None.

CLINICAL DATA: Three days status post cholecystectomy. Abdominal
pain. Draining fluid from surgical wound. Evaluate for bile leak.

EXAM:
NUCLEAR MEDICINE HEPATOBILIARY IMAGING
TECHNIQUE: Sequential images of the abdomen were obtained [DATE] minutes
following intravenous administration of radiopharmaceutical.
RADIOPHARMACEUTICALS:  5.2 mCi [DH]  Choletec IV

[Series 1000: hepatobiliary scan · 9.59mm/px · 6 of 60 frames shown]
[frame 6/60]
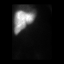
[frame 16/60]
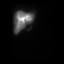
[frame 26/60]
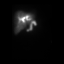
[frame 36/60]
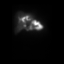
[frame 46/60]
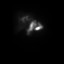
[frame 56/60]
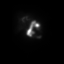

[6 of 6 positions shown; findings below may reference images not displayed]

FINDINGS: Prompt uptake and biliary excretion of activity by the liver is
seen. No gallbladder activity, consistent with prior
cholecystectomy. Biliary activity promptly passes into small bowel,
consistent with patent common bile duct. No evidence of leak of
biliary activity.
IMPRESSION: Prior cholecystectomy. No evidence of postop bile leak or biliary
obstruction.

## 2017-04-20 MED ORDER — KETOROLAC TROMETHAMINE 30 MG/ML IJ SOLN
30.0000 mg | Freq: Three times a day (TID) | INTRAMUSCULAR | Status: AC
  Administered 2017-04-20 – 2017-04-22 (×6): 30 mg via INTRAVENOUS
  Filled 2017-04-20 (×7): qty 1

## 2017-04-20 MED ORDER — TECHNETIUM TC 99M MEBROFENIN IV KIT
5.0000 | PACK | Freq: Once | INTRAVENOUS | Status: AC | PRN
  Administered 2017-04-20: 5.21 via INTRAVENOUS

## 2017-04-20 NOTE — Progress Notes (Signed)
AVSS.  "Lots of pain"   PO fair, sometimes causes "spasms". Lungs: Shallow breathing. Incentive at 500! ABD: Non-distended, mild, diffuse tendenress. Wounds: Clean and dry. Calves: Soft.  JP: Old, bilious material, 150 cc/ 24 hours. Labs: LFT's continue to trend down. , TB: 1.6, DB: 0.6. HGB stable, normal WBC. HIDA: No leak evident from gallbladder bed, duct. Likely secondary to damage to liver bed during gallbladder removal. Will leave drain for present. Plan: Add IV toradol, strongly encourage ambulation.

## 2017-04-21 NOTE — Progress Notes (Signed)
AVSS. Looks much better today. Ambulating well. Lungs: Clear. ABD: Soft. JP volume down, old blood, thin, drain removed without incident. HIDA yesterday negative for leak. Passing gas.3 Requires some assistance in and out of bed this AM.  Wounds redressed. Pain under better control. Plan: Ambulate, shower, tentative d/c in AM. Work toward po pain control.

## 2017-04-22 MED ORDER — OXYCODONE-ACETAMINOPHEN 5-325 MG PO TABS: 1.0000 | ORAL_TABLET | ORAL | 0 refills | Status: DC | PRN

## 2017-04-22 NOTE — Final Progress Note (Signed)
AVSS. Tolerating diet. Ambulating well. Inspirex at 1125. Lungs: Clear. ABD: Soft. Dressings: Dry. Home today.

## 2017-04-22 NOTE — Progress Notes (Signed)
Discharge order received. Patient is alert and oriented. Vital signs stable . No signs of acute distress. Discharge instructions given. Patient verbalized understanding. No other issues noted at this time.   

## 2017-04-24 ENCOUNTER — Ambulatory Visit (INDEPENDENT_AMBULATORY_CARE_PROVIDER_SITE_OTHER): Payer: BLUE CROSS/BLUE SHIELD

## 2017-04-24 DIAGNOSIS — K8020 Calculus of gallbladder without cholecystitis without obstruction: Secondary | ICD-10-CM

## 2017-04-24 NOTE — Progress Notes (Signed)
Patient came in today for a wound check. The staples were removed and steri strips applied. The wound is clean, with no signs of infection noted. Follow up as scheduled.

## 2017-04-24 NOTE — Patient Instructions (Signed)
Patient to return as scheduled.  

## 2017-04-25 ENCOUNTER — Encounter: Payer: Self-pay | Admitting: *Deleted

## 2017-04-25 NOTE — Progress Notes (Signed)
Pt needs return to work note faxed to [phone]

## 2017-04-27 ENCOUNTER — Ambulatory Visit (INDEPENDENT_AMBULATORY_CARE_PROVIDER_SITE_OTHER): Payer: BLUE CROSS/BLUE SHIELD | Admitting: General Surgery

## 2017-04-27 ENCOUNTER — Telehealth: Payer: Self-pay | Admitting: *Deleted

## 2017-04-27 ENCOUNTER — Encounter: Payer: Self-pay | Admitting: General Surgery

## 2017-04-27 VITALS — BP 148/82 | HR 108 | Temp 101.4°F | Resp 16 | Ht 62.5 in | Wt 249.0 lb

## 2017-04-27 DIAGNOSIS — R5082 Postprocedural fever: Secondary | ICD-10-CM

## 2017-04-27 MED ORDER — LEVOFLOXACIN 750 MG PO TABS: 750.0000 mg | ORAL_TABLET | Freq: Every day | ORAL | 0 refills | Status: DC

## 2017-04-27 NOTE — Telephone Encounter (Signed)
She called to let Dr Bary Castilla know that around 10 Am she started having lower back pain and between the breast pain associated with shaking and chills lasting about 2 hours. She did take a pain pill and seems to be some better. She feels like she has a fever but no thermometer to take her temp. No nausea or vomiting. BM 2 days ago was loose and a lot. Tolerating small meals with less fat.

## 2017-04-27 NOTE — Patient Instructions (Addendum)
The patient is aware to call back for any questions or concerns. Take temperature three times a day

## 2017-04-27 NOTE — Telephone Encounter (Signed)
appt today 330 per Dr Bary Castilla

## 2017-04-27 NOTE — Progress Notes (Signed)
Patient ID: Sandra Wilkins, female   DOB: 15-Aug-1966, 51 y.o.   MRN: [id number]  Chief Complaint  Patient presents with  . Abdominal Pain    HPI Sandra Wilkins is a 51 y.o. female.  around 10 Am she started having lower back pain and between the breast pain associated with shaking and chills lasting about 2 hours. She did take a pain pill and seems to be some better. She feels like she has a fever but no thermometer to take her temp. No nausea or vomiting. BM 2 days ago was loose/watery and green. Tolerating small meals with less fat. The patient was discharged 6 days ago, and seen in the office 4 days ago for staple removal. At that time she was doing well.    She states the pain has subsided.  She is here with her mother in law, Perry Mount.  HPI  Past Medical History:  Diagnosis Date  . Anxiety   . Arthritis    DDD-NECK  . Depression   . GERD (gastroesophageal reflux disease)   . Headache    MIGRAINES  . Hypertension    H/O WAS ON FUROSEMIDE IN THE PAST BUT LOST WEIGHT AND WAS TAKEN OFF DUE TO BP CONTROL  . Pre-diabetes   . Sleep apnea    CPAP    Past Surgical History:  Procedure Laterality Date  . BREAST BIOPSY Right 2012   benign  . CHOLECYSTECTOMY  04/17/2017   Procedure: CHOLECYSTECTOMY;  Surgeon: Robert Bellow, MD;  Location: [facility] ORS;  Service: General;;  Laparoscopic converted to open  . ERCP N/A 04/13/2017   Procedure: ENDOSCOPIC RETROGRADE CHOLANGIOPANCREATOGRAPHY (ERCP);  Surgeon: Lucilla Lame, MD;  Location: [facility] ENDOSCOPY;  Service: Endoscopy;  Laterality: N/A;  . TONSILLECTOMY AND ADENOIDECTOMY  1972    Family History  Problem Relation Age of Onset  . Hyperlipidemia Father   . Depression Sister   . Bipolar disorder Brother   . Seizures Brother   . Congestive Heart Failure Maternal Grandfather   . Dementia Paternal Grandfather   . Depression Sister   . Depression Mother   . Arthritis Mother   . Emphysema Mother   . Congestive Heart Failure  Mother   . Arthritis Maternal Grandmother   . Asthma Maternal Grandmother     Social History Social History  Substance Use Topics  . Smoking status: Former Smoker    Packs/day: 0.50    Years: 20.00    Types: Cigarettes    Quit date: 11/13/2005  . Smokeless tobacco: Never Used  . Alcohol use Yes     Comment: RARE    Allergies  Allergen Reactions  . Codeine Hives  . Fish Oil Other (See Comments)    Indigestion Indigestion  . Other Hives and Itching    Metronidazole or Ciprofloxacin    . Sulfa Antibiotics Hives and Itching  . Tramadol Other (See Comments)    Hallucinations  . Penicillins Rash    Has patient had a PCN reaction causing immediate rash, facial/tongue/throat swelling, SOB or lightheadedness with hypotension: Unknown Has patient had a PCN reaction causing severe rash involving mucus membranes or skin necrosis: Unknown Has patient had a PCN reaction that required hospitalization: Unknown Has patient had a PCN reaction occurring within the last 10 years: No If all of the above answers are "NO", then may proceed with Cephalosporin use.     Current Outpatient Prescriptions  Medication Sig Dispense Refill  . ALPRAZolam (XANAX) 0.5 MG tablet TAKE 1/2-2 TABLETS  BY MOUTH EVERY 4 HOURS AS NEEDED FOR PANIC ATTACKS 30 tablet 3  . cetirizine (ZYRTEC) 10 MG tablet Take 10 mg by mouth every morning. NOT TAKING SINCE SHE HAS BEEN SICK WITH GALLBLADDER    . Ibuprofen 200 MG CAPS Take 400 mg by mouth 2 (two) times daily as needed (PAIN).     Marland Kitchen omeprazole (PRILOSEC) 20 MG capsule TAKE 1 CAPSULE (20 MG TOTAL) BY MOUTH TWICE DAILY 60 capsule 11  . oxyCODONE-acetaminophen (ROXICET) 5-325 MG tablet Take 1-2 tablets by mouth every 4 (four) hours as needed for severe pain. 20 tablet 0  . sertraline (ZOLOFT) 100 MG tablet Take 1 tablet (100 mg total) by mouth 2 (two) times daily. 60 tablet 12  . levofloxacin (LEVAQUIN) 750 MG tablet Take 1 tablet (750 mg total) by mouth daily. 7 tablet  0   No current facility-administered medications for this visit.     Review of Systems Review of Systems  Constitutional: Negative.   Respiratory: Negative.   Cardiovascular: Negative.   Gastrointestinal: Positive for abdominal pain. Negative for nausea and vomiting.  Genitourinary: Negative for dysuria.    Blood pressure (!) 148/82, pulse (!) 108, temperature (!) 101.4 F (38.6 C), resp. rate 16, height 5' 2.5" (1.588 m), weight 249 lb (112.9 kg).  Physical Exam Physical Exam  Constitutional: She is oriented to person, place, and time. She appears well-developed and well-nourished.  HENT:  Mouth/Throat: Oropharynx is clear and moist.  Eyes: Conjunctivae are normal. No scleral icterus.  Cardiovascular: Normal rate, regular rhythm and normal heart sounds.   Mild ankle edema  Pulmonary/Chest: Effort normal and breath sounds normal.  Abdominal: Soft. Bowel sounds are normal. There is no tenderness.    Port sites clean, bruise RLQ. Steri strips intact along incision.  Neurological: She is alert and oriented to person, place, and time.  Skin: Skin is warm and dry.  Psychiatric: Her behavior is normal.    Data Reviewed HIDA scan completed on postoperative day 3 showed no evidence of bile leak.  Assessment    Postoperative fever, transient abdominal pain.    Plan    The patient is allergic to sulfa and penicillin. Questionable reaction to Flagyl versus Cipro. We will him. Clean treat with Levaquin pending laboratory studies.  Phone follow-up tomorrow.    Levaquin RX C BC, MetC, total bili, lipase Take temp TID   HPI, Physical Exam, Assessment and Plan have been scribed under the direction and in the presence of Robert Bellow, MD.  Karie Fetch, RN  I have completed the exam and reviewed the above documentation for accuracy and completeness.  I agree with the above.  Haematologist has been used and any errors in dictation or transcription are  unintentional.  Hervey Ard, M.D., F.A.C.S.   Robert Bellow 04/27/2017, 9:04 PM

## 2017-04-28 LAB — COMPREHENSIVE METABOLIC PANEL
ALT: 52 IU/L — AB (ref 0–32)
AST: 29 IU/L (ref 0–40)
Albumin/Globulin Ratio: 1.3 (ref 1.2–2.2)
Albumin: 3.5 g/dL (ref 3.5–5.5)
Alkaline Phosphatase: 167 IU/L — ABNORMAL HIGH (ref 39–117)
BUN/Creatinine Ratio: 15 (ref 9–23)
BUN: 13 mg/dL (ref 6–24)
Bilirubin Total: 0.9 mg/dL (ref 0.0–1.2)
CO2: 26 mmol/L (ref 20–29)
CREATININE: 0.88 mg/dL (ref 0.57–1.00)
Calcium: 8.7 mg/dL (ref 8.7–10.2)
Chloride: 98 mmol/L (ref 96–106)
GFR, EST AFRICAN AMERICAN: 89 mL/min/{1.73_m2} (ref >59)
GFR, EST NON AFRICAN AMERICAN: 77 mL/min/{1.73_m2} (ref >59)
GLUCOSE: 96 mg/dL (ref 65–99)
Globulin, Total: 2.8 g/dL (ref 1.5–4.5)
Potassium: 4.1 mmol/L (ref 3.5–5.2)
Sodium: 138 mmol/L (ref 134–144)
TOTAL PROTEIN: 6.3 g/dL (ref 6.0–8.5)

## 2017-04-28 LAB — CBC WITH DIFFERENTIAL/PLATELET
BASOS ABS: 0 10*3/uL (ref 0.0–0.2)
BASOS: 0 %
EOS (ABSOLUTE): 0.2 10*3/uL (ref 0.0–0.4)
Eos: 1 %
Hematocrit: 34.3 % (ref 34.0–46.6)
Hemoglobin: 11.1 g/dL (ref 11.1–15.9)
IMMATURE GRANS (ABS): 0.1 10*3/uL (ref 0.0–0.1)
IMMATURE GRANULOCYTES: 1 %
LYMPHS: 12 %
Lymphocytes Absolute: 1.4 10*3/uL (ref 0.7–3.1)
MCH: 28 pg (ref 26.6–33.0)
MCHC: 32.4 g/dL (ref 31.5–35.7)
MCV: 86 fL (ref 79–97)
Monocytes Absolute: 0.9 10*3/uL (ref 0.1–0.9)
Monocytes: 8 %
NEUTROS PCT: 78 %
Neutrophils Absolute: 8.6 10*3/uL — ABNORMAL HIGH (ref 1.4–7.0)
PLATELETS: 359 10*3/uL (ref 150–379)
RBC: 3.97 x10E6/uL (ref 3.77–5.28)
RDW: 15.2 % (ref 12.3–15.4)
WBC: 11 10*3/uL — AB (ref 3.4–10.8)

## 2017-04-28 LAB — LIPASE: Lipase: 25 U/L (ref 14–72)

## 2017-05-02 ENCOUNTER — Encounter: Payer: Self-pay | Admitting: General Surgery

## 2017-05-02 ENCOUNTER — Ambulatory Visit (INDEPENDENT_AMBULATORY_CARE_PROVIDER_SITE_OTHER): Payer: BLUE CROSS/BLUE SHIELD | Admitting: General Surgery

## 2017-05-02 VITALS — BP 120/72 | HR 82 | Resp 12 | Ht 62.5 in | Wt 246.0 lb

## 2017-05-02 DIAGNOSIS — K8020 Calculus of gallbladder without cholecystitis without obstruction: Secondary | ICD-10-CM

## 2017-05-02 NOTE — Patient Instructions (Signed)
The patient is aware to call back for any questions or concerns.  

## 2017-05-02 NOTE — Progress Notes (Signed)
Patient ID: Sandra Wilkins, female   DOB: [birth year], 50 y.o.   MRN: [id number]  Chief Complaint  Patient presents with  . Routine Post Op    HPI Sandra Wilkins is a 51 y.o. female.  Here today for postoperative visit, she states she is doing well. She is still sore. Burning numbing pain upper central abdomen after she eats lasting about 1 hour.   Denies any gastrointestinal issues, bowels are formed green stool and moving regular.  HPI  Past Medical History:  Diagnosis Date  . Anxiety   . Arthritis    DDD-NECK  . Depression   . GERD (gastroesophageal reflux disease)   . Headache    MIGRAINES  . Hypertension    H/O WAS ON FUROSEMIDE IN THE PAST BUT LOST WEIGHT AND WAS TAKEN OFF DUE TO BP CONTROL  . Pre-diabetes   . Sleep apnea    CPAP    Past Surgical History:  Procedure Laterality Date  . BREAST BIOPSY Right 2012   benign  . CHOLECYSTECTOMY  04/17/2017   Procedure: CHOLECYSTECTOMY;  Surgeon: Robert Bellow, MD;  Location: [facility] ORS;  Service: General;;  Laparoscopic converted to open  . ERCP N/A 04/13/2017   Procedure: ENDOSCOPIC RETROGRADE CHOLANGIOPANCREATOGRAPHY (ERCP);  Surgeon: Lucilla Lame, MD;  Location: [facility] ENDOSCOPY;  Service: Endoscopy;  Laterality: N/A;  . TONSILLECTOMY AND ADENOIDECTOMY  1972    Family History  Problem Relation Age of Onset  . Hyperlipidemia Father   . Depression Sister   . Bipolar disorder Brother   . Seizures Brother   . Congestive Heart Failure Maternal Grandfather   . Dementia Paternal Grandfather   . Depression Sister   . Depression Mother   . Arthritis Mother   . Emphysema Mother   . Congestive Heart Failure Mother   . Arthritis Maternal Grandmother   . Asthma Maternal Grandmother     Social History Social History  Substance Use Topics  . Smoking status: Former Smoker    Packs/day: 0.50    Years: 20.00    Types: Cigarettes    Quit date: 11/13/2005  . Smokeless tobacco: Never Used  . Alcohol use Yes     Comment: RARE     Allergies  Allergen Reactions  . Codeine Hives  . Fish Oil Other (See Comments)    Indigestion Indigestion  . Other Hives and Itching    Metronidazole or Ciprofloxacin    . Sulfa Antibiotics Hives and Itching  . Tramadol Other (See Comments)    Hallucinations  . Penicillins Rash    Has patient had a PCN reaction causing immediate rash, facial/tongue/throat swelling, SOB or lightheadedness with hypotension: Unknown Has patient had a PCN reaction causing severe rash involving mucus membranes or skin necrosis: Unknown Has patient had a PCN reaction that required hospitalization: Unknown Has patient had a PCN reaction occurring within the last 10 years: No If all of the above answers are "NO", then may proceed with Cephalosporin use.     Current Outpatient Prescriptions  Medication Sig Dispense Refill  . ALPRAZolam (XANAX) 0.5 MG tablet TAKE 1/2-2 TABLETS BY MOUTH EVERY 4 HOURS AS NEEDED FOR PANIC ATTACKS 30 tablet 3  . cetirizine (ZYRTEC) 10 MG tablet Take 10 mg by mouth every morning. NOT TAKING SINCE SHE HAS BEEN SICK WITH GALLBLADDER    . Ibuprofen 200 MG CAPS Take 400 mg by mouth 2 (two) times daily as needed (PAIN).     Marland Kitchen levofloxacin (LEVAQUIN) 750 MG tablet Take 1 tablet (  750 mg total) by mouth daily. 7 tablet 0  . omeprazole (PRILOSEC) 20 MG capsule TAKE 1 CAPSULE (20 MG TOTAL) BY MOUTH TWICE DAILY 60 capsule 11  . sertraline (ZOLOFT) 100 MG tablet Take 1 tablet (100 mg total) by mouth 2 (two) times daily. 60 tablet 12   No current facility-administered medications for this visit.     Review of Systems Review of Systems  Constitutional: Negative.   Respiratory: Negative.     Blood pressure 120/72, pulse 82, resp. rate 12, height 5' 2.5" (1.588 m), weight 246 lb (111.6 kg).  Physical Exam Physical Exam  Constitutional: She is oriented to person, place, and time. She appears well-developed and well-nourished.  HENT:  Mouth/Throat: Oropharynx is clear and moist.   Eyes: Conjunctivae are normal. No scleral icterus.  Neck: Neck supple.  Cardiovascular: Normal rate, regular rhythm and normal heart sounds.   Pulmonary/Chest: Effort normal and breath sounds normal.  Abdominal: Soft. Normal appearance and bowel sounds are normal. There is no tenderness.    Ports sites and incision healing  Lymphadenopathy:    She has no cervical adenopathy.  Neurological: She is alert and oriented to person, place, and time.  Skin: Skin is warm and dry.  Psychiatric: Her behavior is normal.    Data Reviewed Laboratory studies obtained of the time of her last visit showed a white blood cell count 11,000 with 78% polys, hemoglobin of 11.3 essentially unchanged from Hospital values and a normal platelet count of 3 and 359,000. Liver function studies showed continued improvement in total bilirubin down to 1.6 and direct bilirubin at 0.6. Transaminases were returning to normal as was the alkaline phosphatase. Lipase was normal.  Assessment    Resolving abdominal pain and fever.    Plan    The patient will complete her present course of Augmentin.    Follow up  In 2 weeks. No heavy lifting. The patient may return to work on 05-08-17, working half days. Physical activity limitations include: no lifting over 10 pounds until her next appointment .    HPI, Physical Exam, Assessment and Plan have been scribed under the direction and in the presence of Robert Bellow, MD.  Karie Fetch, RN  I have completed the exam and reviewed the above documentation for accuracy and completeness.  I agree with the above.  Haematologist has been used and any errors in dictation or transcription are unintentional.  Hervey Ard, M.D., F.A.C.S.  Robert Bellow 05/02/2017, 7:18 PM

## 2017-05-05 NOTE — Discharge Summary (Signed)
Physician Discharge Summary  Patient ID: Sandra Wilkins MRN: [id number] DOB/AGE: [birth year] 51 y.o.  Admit date: 04/17/2017 Discharge date: 05/05/2017  Admission Diagnoses: Cholecystitis with cholelithiasis.  Discharge Diagnoses:  Active Problems:   Gallstones and inflammation of gallbladder without obstruction   Discharged Condition: good  Hospital Course: Patient underwent open cholecystectomy and was admitted for postop management. There was significant inflammation in the gallbladder bed and a drain placed at the time of surgery showed a small amount of bile. HIDA scan was completed showing no evidence of bile leak or loss of continuity of the extrahepatic biliary tree.  The patient was managed with SCD and Lovenox for DVT prevention.  Diet was gradually reintroduced and ambulation initiated area  Consults: None  Significant Diagnostic Studies: nuclear medicine: HIDA scan   Treatments: IV hydration  Discharge Exam: Blood pressure 116/69, pulse 64, temperature 97.6 F (36.4 C), temperature source Oral, resp. rate 18, height 5\' 2"  (1.575 m), weight 247 lb 12.8 oz (112.4 kg), SpO2 98 %. General appearance: alert and cooperative Resp: clear to auscultation bilaterally Cardio: regular rate and rhythm, S1, S2 normal, no murmur, click, rub or gallop GI: soft, non-tender; bowel sounds normal; no masses,  no organomegaly Incision/Wound: Healing well. Drain removed.  Disposition: 01-Home or Self Care  Discharge Instructions    Diet - low sodium heart healthy    Complete by:  As directed    Diet - low sodium heart healthy    Complete by:  As directed    Discharge instructions    Complete by:  As directed    OK to shower. Come to office Monday for staff to remove staples. No lifting over 10 pounds. No driving until pain free. Resume your regular medications. Tylenol/ Advil/ Ibuprofen/ Aleve: If needed for soreness. Percocet (oxycodone): If needed for pain. This medication may  constipate. Laxative of choice if needed.   Discharge instructions    Complete by:  As directed    Diet as tolerated.   Increase activity slowly    Complete by:  As directed    Increase activity slowly    Complete by:  As directed      Allergies as of 04/22/2017      Reactions   Codeine Hives   Fish Oil Other (See Comments)   Indigestion Indigestion   Other Hives, Itching   Metronidazole or Ciprofloxacin    Sulfa Antibiotics Hives, Itching   Tramadol Other (See Comments)   Hallucinations   Penicillins Rash   Has patient had a PCN reaction causing immediate rash, facial/tongue/throat swelling, SOB or lightheadedness with hypotension: Unknown Has patient had a PCN reaction causing severe rash involving mucus membranes or skin necrosis: Unknown Has patient had a PCN reaction that required hospitalization: Unknown Has patient had a PCN reaction occurring within the last 10 years: No If all of the above answers are "NO", then may proceed with Cephalosporin use.      Medication List    TAKE these medications   ALPRAZolam 0.5 MG tablet Commonly known as:  XANAX TAKE 1/2-2 TABLETS BY MOUTH EVERY 4 HOURS AS NEEDED FOR PANIC ATTACKS   cetirizine 10 MG tablet Commonly known as:  ZYRTEC Take 10 mg by mouth every morning. NOT TAKING SINCE SHE HAS BEEN SICK WITH GALLBLADDER   Ibuprofen 200 MG Caps Take 400 mg by mouth 2 (two) times daily as needed (PAIN).   omeprazole 20 MG capsule Commonly known as:  PRILOSEC TAKE 1 CAPSULE (20 MG TOTAL) BY  MOUTH TWICE DAILY   sertraline 100 MG tablet Commonly known as:  ZOLOFT Take 1 tablet (100 mg total) by mouth 2 (two) times daily. What changed:  when to take this      Follow-up Information    Jannatul Wojdyla, Forest Gleason, MD Follow up in 2 day(s).   Specialties:  General Surgery, Radiology Why:  For staple removal with the staff.  Contact information: [address] [place] [address] [phone]           Signed: Robert Bellow 05/05/2017, 7:33 AM

## 2017-05-12 ENCOUNTER — Other Ambulatory Visit: Payer: Self-pay

## 2017-05-12 DIAGNOSIS — R17 Unspecified jaundice: Secondary | ICD-10-CM

## 2017-05-16 ENCOUNTER — Ambulatory Visit (INDEPENDENT_AMBULATORY_CARE_PROVIDER_SITE_OTHER): Payer: BLUE CROSS/BLUE SHIELD | Admitting: General Surgery

## 2017-05-16 ENCOUNTER — Encounter: Payer: Self-pay | Admitting: General Surgery

## 2017-05-16 VITALS — HR 76 | Resp 12 | Ht 62.0 in | Wt 242.0 lb

## 2017-05-16 DIAGNOSIS — K8020 Calculus of gallbladder without cholecystitis without obstruction: Secondary | ICD-10-CM

## 2017-05-16 NOTE — Patient Instructions (Addendum)
  Patient to return as needed. Send a message through my chart and let us know how you are doing at the end of the month.  The patient is aware to call back for any questions or concerns.

## 2017-05-16 NOTE — Progress Notes (Signed)
Patient ID: Sandra Wilkins, female   DOB: Mar 31, 1966, 51 y.o.   MRN: [id number]  Chief Complaint  Patient presents with  . Routine Post Op    gallbladder    HPI Sandra Wilkins is a 51 y.o. female here today for her post op cholecystectomy done on 04/17/2017. Patient states she has been having diarrhea since the past week after every meal. One time she had a salad (With grilled chicken and [place] dressing) and started having some abdominal pain above the belly button that lasted about 30 minutes. No nausea or vomiting.  Has returned to work with weight restrictions as requested.    Marland KitchenHPI  Past Medical History:  Diagnosis Date  . Anxiety   . Arthritis    DDD-NECK  . Depression   . GERD (gastroesophageal reflux disease)   . Headache    MIGRAINES  . Hypertension    H/O WAS ON FUROSEMIDE IN THE PAST BUT LOST WEIGHT AND WAS TAKEN OFF DUE TO BP CONTROL  . Pre-diabetes   . Sleep apnea    CPAP    Past Surgical History:  Procedure Laterality Date  . BREAST BIOPSY Right 2012   benign  . CHOLECYSTECTOMY  04/17/2017   Procedure: CHOLECYSTECTOMY;  Surgeon: Robert Bellow, MD;  Location: [facility] ORS;  Service: General;;  Laparoscopic converted to open  . ERCP N/A 04/13/2017   Procedure: ENDOSCOPIC RETROGRADE CHOLANGIOPANCREATOGRAPHY (ERCP);  Surgeon: Lucilla Lame, MD;  Location: [facility] ENDOSCOPY;  Service: Endoscopy;  Laterality: N/A;  . TONSILLECTOMY AND ADENOIDECTOMY  1972    Family History  Problem Relation Age of Onset  . Hyperlipidemia Father   . Depression Sister   . Bipolar disorder Brother   . Seizures Brother   . Congestive Heart Failure Maternal Grandfather   . Dementia Paternal Grandfather   . Depression Sister   . Depression Mother   . Arthritis Mother   . Emphysema Mother   . Congestive Heart Failure Mother   . Arthritis Maternal Grandmother   . Asthma Maternal Grandmother     Social History Social History  Substance Use Topics  . Smoking status: Former Smoker     Packs/day: 0.50    Years: 20.00    Types: Cigarettes    Quit date: 11/13/2005  . Smokeless tobacco: Never Used  . Alcohol use Yes     Comment: RARE    Allergies  Allergen Reactions  . Codeine Hives  . Fish Oil Other (See Comments)    Indigestion Indigestion  . Other Hives and Itching    Metronidazole or Ciprofloxacin    . Sulfa Antibiotics Hives and Itching  . Tramadol Other (See Comments)    Hallucinations  . Penicillins Rash    Has patient had a PCN reaction causing immediate rash, facial/tongue/throat swelling, SOB or lightheadedness with hypotension: Unknown Has patient had a PCN reaction causing severe rash involving mucus membranes or skin necrosis: Unknown Has patient had a PCN reaction that required hospitalization: Unknown Has patient had a PCN reaction occurring within the last 10 years: No If Wilkins of the above answers are "NO", then may proceed with Cephalosporin use.     Current Outpatient Prescriptions  Medication Sig Dispense Refill  . ALPRAZolam (XANAX) 0.5 MG tablet TAKE 1/2-2 TABLETS BY MOUTH EVERY 4 HOURS AS NEEDED FOR PANIC ATTACKS 30 tablet 3  . cetirizine (ZYRTEC) 10 MG tablet Take 10 mg by mouth every morning. NOT TAKING SINCE SHE HAS BEEN SICK WITH GALLBLADDER    . Ibuprofen  200 MG CAPS Take 400 mg by mouth 2 (two) times daily as needed (PAIN).     Marland Kitchen levofloxacin (LEVAQUIN) 750 MG tablet Take 1 tablet (750 mg total) by mouth daily. 7 tablet 0  . omeprazole (PRILOSEC) 20 MG capsule TAKE 1 CAPSULE (20 MG TOTAL) BY MOUTH TWICE DAILY 60 capsule 11  . sertraline (ZOLOFT) 100 MG tablet Take 1 tablet (100 mg total) by mouth 2 (two) times daily. 60 tablet 12   No current facility-administered medications for this visit.     Review of Systems Review of Systems  Constitutional: Negative.   Respiratory: Negative.   Cardiovascular: Negative.     Pulse 76, resp. rate 12, height 5\' 2"  (1.575 m), weight 242 lb (109.8 kg).  Physical Exam Physical Exam   Constitutional: She is oriented to person, place, and time. She appears well-developed and well-nourished.  Abdominal: Soft. Normal appearance and bowel sounds are normal. There is no hepatomegaly. There is no tenderness.    Incision is clean and healing well.   Neurological: She is alert and oriented to person, place, and time.  Skin: Skin is warm.      Assessment    Postprandial diarrhea postcholecystectomy.    Plan    I anticipate the patient's GI symptoms to resolve.  Patient is scheduled for stent removal next month with Dr. Allen Norris.       Patient to return as needed. Send a message through my chart and let us know how you are doing at the end of the month. The patient is aware to call back for any questions or concerns.   HPI, Physical Exam, Assessment and Plan have been scribed under the direction and in the presence of Hervey Ard, MD.  Sandra Wilkins, CMA  I have completed the exam and reviewed the above documentation for accuracy and completeness.  I agree with the above.  Haematologist has been used and any errors in dictation or transcription are unintentional.  Hervey Ard, M.D., F.A.C.S.   Robert Bellow 05/16/2017, 9:43 PM

## 2017-05-29 ENCOUNTER — Encounter: Payer: Self-pay | Admitting: Family Medicine

## 2017-06-17 ENCOUNTER — Encounter: Payer: Self-pay | Admitting: General Surgery

## 2017-06-19 ENCOUNTER — Encounter: Payer: Self-pay | Admitting: *Deleted

## 2017-06-20 ENCOUNTER — Encounter: Payer: Self-pay | Admitting: Anesthesiology

## 2017-06-20 ENCOUNTER — Ambulatory Visit
Admission: RE | Admit: 2017-06-20 | Discharge: 2017-06-20 | Disposition: A | Discharge status: Home / Self Care | Payer: BLUE CROSS/BLUE SHIELD | Source: Ambulatory Visit | Attending: Gastroenterology | Admitting: Gastroenterology

## 2017-06-20 ENCOUNTER — Ambulatory Visit: Payer: BLUE CROSS/BLUE SHIELD | Admitting: Anesthesiology

## 2017-06-20 ENCOUNTER — Ambulatory Visit: Payer: BLUE CROSS/BLUE SHIELD

## 2017-06-20 ENCOUNTER — Encounter
Admission: RE | Disposition: A | Discharge status: Home / Self Care | Payer: Self-pay | Source: Ambulatory Visit | Attending: Gastroenterology

## 2017-06-20 DIAGNOSIS — Z8349 Family history of other endocrine, nutritional and metabolic diseases: Secondary | ICD-10-CM | POA: Insufficient documentation

## 2017-06-20 DIAGNOSIS — R7303 Prediabetes: Secondary | ICD-10-CM | POA: Diagnosis not present

## 2017-06-20 DIAGNOSIS — Z4659 Encounter for fitting and adjustment of other gastrointestinal appliance and device: Secondary | ICD-10-CM | POA: Diagnosis not present

## 2017-06-20 DIAGNOSIS — Z8249 Family history of ischemic heart disease and other diseases of the circulatory system: Secondary | ICD-10-CM | POA: Insufficient documentation

## 2017-06-20 DIAGNOSIS — G4730 Sleep apnea, unspecified: Secondary | ICD-10-CM | POA: Diagnosis not present

## 2017-06-20 DIAGNOSIS — Z880 Allergy status to penicillin: Secondary | ICD-10-CM | POA: Diagnosis not present

## 2017-06-20 DIAGNOSIS — K219 Gastro-esophageal reflux disease without esophagitis: Secondary | ICD-10-CM | POA: Diagnosis not present

## 2017-06-20 DIAGNOSIS — Z9049 Acquired absence of other specified parts of digestive tract: Secondary | ICD-10-CM | POA: Diagnosis not present

## 2017-06-20 DIAGNOSIS — Z9889 Other specified postprocedural states: Secondary | ICD-10-CM | POA: Insufficient documentation

## 2017-06-20 DIAGNOSIS — F419 Anxiety disorder, unspecified: Secondary | ICD-10-CM | POA: Insufficient documentation

## 2017-06-20 DIAGNOSIS — Z885 Allergy status to narcotic agent status: Secondary | ICD-10-CM | POA: Diagnosis not present

## 2017-06-20 DIAGNOSIS — K8050 Calculus of bile duct without cholangitis or cholecystitis without obstruction: Secondary | ICD-10-CM | POA: Insufficient documentation

## 2017-06-20 DIAGNOSIS — Z8261 Family history of arthritis: Secondary | ICD-10-CM | POA: Insufficient documentation

## 2017-06-20 DIAGNOSIS — Z79899 Other long term (current) drug therapy: Secondary | ICD-10-CM | POA: Diagnosis not present

## 2017-06-20 DIAGNOSIS — Z818 Family history of other mental and behavioral disorders: Secondary | ICD-10-CM | POA: Insufficient documentation

## 2017-06-20 DIAGNOSIS — Z91013 Allergy to seafood: Secondary | ICD-10-CM | POA: Insufficient documentation

## 2017-06-20 DIAGNOSIS — Z87891 Personal history of nicotine dependence: Secondary | ICD-10-CM | POA: Diagnosis not present

## 2017-06-20 DIAGNOSIS — Z825 Family history of asthma and other chronic lower respiratory diseases: Secondary | ICD-10-CM | POA: Diagnosis not present

## 2017-06-20 DIAGNOSIS — Z882 Allergy status to sulfonamides status: Secondary | ICD-10-CM | POA: Diagnosis not present

## 2017-06-20 DIAGNOSIS — Z836 Family history of other diseases of the respiratory system: Secondary | ICD-10-CM | POA: Insufficient documentation

## 2017-06-20 DIAGNOSIS — F329 Major depressive disorder, single episode, unspecified: Secondary | ICD-10-CM | POA: Diagnosis not present

## 2017-06-20 DIAGNOSIS — Z820 Family history of epilepsy and other diseases of the nervous system: Secondary | ICD-10-CM | POA: Diagnosis not present

## 2017-06-20 DIAGNOSIS — R17 Unspecified jaundice: Secondary | ICD-10-CM

## 2017-06-20 HISTORY — PX: ERCP: SHX5425

## 2017-06-20 LAB — POCT PREGNANCY, URINE: PREG TEST UR: NEGATIVE

## 2017-06-20 SURGERY — ERCP, WITH INTERVENTION IF INDICATED
Anesthesia: General

## 2017-06-20 MED ORDER — FENTANYL CITRATE (PF) 100 MCG/2ML IJ SOLN
25.0000 ug | INTRAMUSCULAR | Status: DC | PRN
  Administered 2017-06-20: 25 ug via INTRAVENOUS

## 2017-06-20 MED ORDER — MIDAZOLAM HCL 2 MG/2ML IJ SOLN
INTRAMUSCULAR | Status: AC
  Filled 2017-06-20: qty 2

## 2017-06-20 MED ORDER — ROCURONIUM BROMIDE 100 MG/10ML IV SOLN
INTRAVENOUS | Status: DC | PRN
  Administered 2017-06-20: 5 mg via INTRAVENOUS
  Administered 2017-06-20: 25 mg via INTRAVENOUS

## 2017-06-20 MED ORDER — ONDANSETRON HCL 4 MG/2ML IJ SOLN
INTRAMUSCULAR | Status: DC | PRN
  Administered 2017-06-20: 4 mg via INTRAVENOUS

## 2017-06-20 MED ORDER — FUROSEMIDE 10 MG/ML IJ SOLN
5.0000 mg | Freq: Once | INTRAMUSCULAR | Status: AC
  Administered 2017-06-20: 5 mg via INTRAVENOUS

## 2017-06-20 MED ORDER — PROPOFOL 500 MG/50ML IV EMUL
INTRAVENOUS | Status: AC
  Filled 2017-06-20: qty 50

## 2017-06-20 MED ORDER — GLYCOPYRROLATE 0.2 MG/ML IJ SOLN
INTRAMUSCULAR | Status: DC | PRN
  Administered 2017-06-20: 0.1 mg via INTRAVENOUS

## 2017-06-20 MED ORDER — DEXAMETHASONE SODIUM PHOSPHATE 10 MG/ML IJ SOLN
INTRAMUSCULAR | Status: DC | PRN
  Administered 2017-06-20: 10 mg via INTRAVENOUS

## 2017-06-20 MED ORDER — PROPOFOL 10 MG/ML IV BOLUS
INTRAVENOUS | Status: AC
  Filled 2017-06-20: qty 20

## 2017-06-20 MED ORDER — LIDOCAINE HCL (PF) 2 % IJ SOLN
INTRAMUSCULAR | Status: AC
  Filled 2017-06-20: qty 2

## 2017-06-20 MED ORDER — ONDANSETRON HCL 4 MG/2ML IJ SOLN
4.0000 mg | Freq: Once | INTRAMUSCULAR | Status: AC | PRN
  Administered 2017-06-20: 4 mg via INTRAVENOUS

## 2017-06-20 MED ORDER — SUGAMMADEX SODIUM 200 MG/2ML IV SOLN
INTRAVENOUS | Status: AC
  Filled 2017-06-20: qty 2

## 2017-06-20 MED ORDER — PROPOFOL 10 MG/ML IV BOLUS
INTRAVENOUS | Status: DC | PRN
  Administered 2017-06-20: 160 mg via INTRAVENOUS
  Administered 2017-06-20: 40 mg via INTRAVENOUS

## 2017-06-20 MED ORDER — ROCURONIUM BROMIDE 50 MG/5ML IV SOLN
INTRAVENOUS | Status: AC
  Filled 2017-06-20: qty 1

## 2017-06-20 MED ORDER — MIDAZOLAM HCL 2 MG/2ML IJ SOLN
INTRAMUSCULAR | Status: DC | PRN
  Administered 2017-06-20: 2 mg via INTRAVENOUS

## 2017-06-20 MED ORDER — SUCCINYLCHOLINE CHLORIDE 20 MG/ML IJ SOLN
INTRAMUSCULAR | Status: DC | PRN
  Administered 2017-06-20: 100 mg via INTRAVENOUS

## 2017-06-20 MED ORDER — SUGAMMADEX SODIUM 200 MG/2ML IV SOLN
INTRAVENOUS | Status: DC | PRN
  Administered 2017-06-20: 200 mg via INTRAVENOUS

## 2017-06-20 MED ORDER — SODIUM CHLORIDE 0.9 % IV SOLN
INTRAVENOUS | Status: DC
  Administered 2017-06-20: 11:00:00 via INTRAVENOUS

## 2017-06-20 MED ORDER — FENTANYL CITRATE (PF) 100 MCG/2ML IJ SOLN
INTRAMUSCULAR | Status: AC
  Filled 2017-06-20: qty 2

## 2017-06-20 MED ORDER — LIDOCAINE HCL (CARDIAC) 20 MG/ML IV SOLN
INTRAVENOUS | Status: DC | PRN
  Administered 2017-06-20: 40 mg via INTRAVENOUS

## 2017-06-20 MED ORDER — FENTANYL CITRATE (PF) 100 MCG/2ML IJ SOLN
INTRAMUSCULAR | Status: DC | PRN
  Administered 2017-06-20 (×2): 50 ug via INTRAVENOUS

## 2017-06-20 NOTE — Anesthesia Postprocedure Evaluation (Signed)
Anesthesia Post Note  Patient: Sandra Wilkins  Procedure(s) Performed: Procedure(s) (LRB): ENDOSCOPIC RETROGRADE CHOLANGIOPANCREATOGRAPHY (ERCP) STENT REMOVAL (N/A)  Patient location during evaluation: PACU Anesthesia Type: General Level of consciousness: awake and alert and oriented Pain management: pain level controlled Vital Signs Assessment: post-procedure vital signs reviewed and stable Respiratory status: spontaneous breathing Cardiovascular status: blood pressure returned to baseline Anesthetic complications: no     Last Vitals:  Vitals:   06/20/17 1328 06/20/17 1329  BP: 129/72   Pulse: 77 75  Resp: 14 14  Temp:      Last Pain:  Vitals:   06/20/17 1328  TempSrc:   PainSc: 2                  Chantelle Verdi

## 2017-06-20 NOTE — Op Note (Signed)
[facility] Patient Name: Sandra Wilkins Procedure Date: 06/20/2017 11:07 AM MRN: [id number] Account #: [id number] Date of Birth: 11-01-1966 Admit Type: Outpatient Age: 51 Room: [facility] ENDO ROOM 4 Gender: Female Note Status: Finalized Procedure:            ERCP Indications:          Bile duct stone(s), Stent removal Providers:            Lucilla Lame MD, MD Referring MD:         Guadalupe Maple, MD (Referring MD) Medicines:            General Anesthesia Complications:        No immediate complications. Procedure:            Pre-Anesthesia Assessment:                       - Prior to the procedure, a History and Physical was                        performed, and patient medications and allergies were                        reviewed. The patient's tolerance of previous                        anesthesia was also reviewed. The risks and benefits of                        the procedure and the sedation options and risks were                        discussed with the patient. All questions were                        answered, and informed consent was obtained. Prior                        Anticoagulants: The patient has taken no previous                        anticoagulant or antiplatelet agents. ASA Grade                        Assessment: II - A patient with mild systemic disease.                        After reviewing the risks and benefits, the patient was                        deemed in satisfactory condition to undergo the                        procedure.                       After obtaining informed consent, the scope was passed                        under direct vision. Throughout the procedure, the  patient's blood pressure, pulse, and oxygen saturations                        were monitored continuously. The Endosonoscope was                        introduced through the mouth, and used to inject   contrast into and used to inject contrast into the                        dorsal pancreatic duct. The ERCP was accomplished                        without difficulty. The patient tolerated the procedure                        well. Findings:      A biliary stent was visible on the scout film. One stent was removed       from the biliary tree using a snare. A wire was passed into the biliary       tree. The bile duct was deeply cannulated. Contrast was injected. I       personally interpreted the bile duct images. There was brisk flow of       contrast through the ducts. Image quality was excellent. Contrast       extended to the entire biliary tree. The upper third of the main bile       duct contained three stones, the largest of which was 13 mm in diameter.       Dilation of the common bile duct with a 15-16.5-18 mm balloon (to a       maximum balloon size of 15 mm) dilator was successful. The biliary tree       was swept with a basket starting at the middle third of the main bile       duct. One stone was removed. Two stones remained. The biliary tree was       swept with a 15 mm balloon starting at the bifurcation. Two stones were       removed. No stones remained. Impression:           - Choledocholithiasis was found. Complete removal was                        accomplished by balloon extraction.                       - One stent was removed from the biliary tree.                       - Common bile duct was successfully dilated.                       - The biliary tree was swept.                       - The biliary tree was swept. Recommendation:       - Watch for pancreatitis, bleeding, perforation, and                        cholangitis. Procedure Code(s):    --- Professional ---  41740, 90, Endoscopic retrograde                        cholangiopancreatography (ERCP); with trans-endoscopic                        balloon dilation of biliary/pancreatic duct(s)  or of                        ampulla (sphincteroplasty), including sphincterotomy,                        when performed, each duct                       43275, Endoscopic retrograde cholangiopancreatography                        (ERCP); with removal of foreign body(s) or stent(s)                        from biliary/pancreatic duct(s)                       43264, Endoscopic retrograde cholangiopancreatography                        (ERCP); with removal of calculi/debris from                        biliary/pancreatic duct(s)                       81448, Endoscopic catheterization of the biliary ductal                        system, radiological supervision and interpretation Diagnosis Code(s):    --- Professional ---                       K80.50, Calculus of bile duct without cholangitis or                        cholecystitis without obstruction                       Z46.59, Encounter for fitting and adjustment of other                        gastrointestinal appliance and device CPT copyright 2016 American Medical Association. All rights reserved. The codes documented in this report are preliminary and upon coder review may  be revised to meet current compliance requirements. Lucilla Lame MD, MD 06/20/2017 12:26:46 PM This report has been signed electronically. Number of Addenda: 0 Note Initiated On: 06/20/2017 11:07 AM      [facility]

## 2017-06-20 NOTE — H&P (Signed)
Lucilla Lame, MD [facility] [address], [place], [place] [address] [phone] Fax : [phone]  Primary Care Physician:  Margo Common, [place] Primary Gastroenterologist:  Dr. Allen Norris  Pre-Procedure History & Physical: HPI:  Sandra Wilkins is a 51 y.o. female is here for an ERCP.   Past Medical History:  Diagnosis Date  . Anxiety   . Arthritis    DDD-NECK  . Depression   . GERD (gastroesophageal reflux disease)   . Headache    MIGRAINES  . Hypertension    H/O WAS ON FUROSEMIDE IN THE PAST BUT LOST WEIGHT AND WAS TAKEN OFF DUE TO BP CONTROL  . Pre-diabetes   . Sleep apnea    CPAP    Past Surgical History:  Procedure Laterality Date  . BREAST BIOPSY Right 2012   benign  . CHOLECYSTECTOMY  04/17/2017   Procedure: CHOLECYSTECTOMY;  Surgeon: Robert Bellow, MD;  Location: [facility] ORS;  Service: General;;  Laparoscopic converted to open  . ERCP N/A 04/13/2017   Procedure: ENDOSCOPIC RETROGRADE CHOLANGIOPANCREATOGRAPHY (ERCP);  Surgeon: Lucilla Lame, MD;  Location: [facility] ENDOSCOPY;  Service: Endoscopy;  Laterality: N/A;  . TONSILLECTOMY AND ADENOIDECTOMY  1972    Prior to Admission medications   Medication Sig Start Date End Date Taking? Authorizing Provider  ALPRAZolam Duanne Moron) 0.5 MG tablet TAKE 1/2-2 TABLETS BY MOUTH EVERY 4 HOURS AS NEEDED FOR PANIC ATTACKS 02/28/17  Yes Birdie Sons, MD  cetirizine (ZYRTEC) 10 MG tablet Take 10 mg by mouth every morning. NOT TAKING SINCE SHE HAS BEEN SICK WITH GALLBLADDER   Yes [provider]  Ibuprofen 200 MG CAPS Take 400 mg by mouth 2 (two) times daily as needed (PAIN).    Yes [provider]  omeprazole (PRILOSEC) 20 MG capsule TAKE 1 CAPSULE (20 MG TOTAL) BY MOUTH TWICE DAILY 04/11/17  Yes Birdie Sons, MD  sertraline (ZOLOFT) 100 MG tablet Take 1 tablet (100 mg total) by mouth 2 (two) times daily. 04/17/17  Yes Birdie Sons, MD    Allergies as of 05/12/2017 - Review Complete 05/02/2017    Allergen Reaction Noted  . Codeine Hives 04/16/2015  . Fish oil Other (See Comments) 04/16/2015  . Other Hives and Itching 04/11/2017  . Sulfa antibiotics Hives and Itching 04/16/2015  . Tramadol Other (See Comments) 04/11/2017  . Penicillins Rash 04/16/2015    Family History  Problem Relation Age of Onset  . Hyperlipidemia Father   . Depression Sister   . Bipolar disorder Brother   . Seizures Brother   . Congestive Heart Failure Maternal Grandfather   . Dementia Paternal Grandfather   . Depression Sister   . Depression Mother   . Arthritis Mother   . Emphysema Mother   . Congestive Heart Failure Mother   . Arthritis Maternal Grandmother   . Asthma Maternal Grandmother     Social History   Social History  . Marital status: Married    Spouse name: N/A  . Number of children: N/A  . Years of education: N/A   Occupational History  . full time    Social History Main Topics  . Smoking status: Former Smoker    Packs/day: 0.50    Years: 20.00    Types: Cigarettes    Quit date: 11/13/2005  . Smokeless tobacco: Never Used  . Alcohol use Yes     Comment: RARE  . Drug use: No  . Sexual activity: Not on file   Other Topics Concern  .  Not on file   Social History Narrative  . No narrative on file    Review of Systems: See HPI, otherwise negative ROS  Physical Exam: BP 127/70   Pulse 74   Temp (!) 96.7 F (35.9 C) (Tympanic)   Resp 20   Ht 5\' 2"  (1.575 m)   Wt 245 lb (111.1 kg)   SpO2 98%   BMI 44.81 kg/m  General:   Alert,  pleasant and cooperative in NAD Head:  Normocephalic and atraumatic. Neck:  Supple; no masses or thyromegaly. Lungs:  Clear throughout to auscultation.    Heart:  Regular rate and rhythm. Abdomen:  Soft, nontender and nondistended. Normal bowel sounds, without guarding, and without rebound.   Neurologic:  Alert and  oriented x4;  grossly normal neurologically.  Impression/Plan: Sandra Wilkins is here for an ERCP to be performed  for stone and stent removal   Risks, benefits, limitations, and alternatives regarding  ERCP have been reviewed with the patient.  Questions have been answered.  All parties agreeable.   Lucilla Lame, MD  06/20/2017, 12:18 PM

## 2017-06-20 NOTE — Anesthesia Post-op Follow-up Note (Signed)
Anesthesia QCDR form completed.        

## 2017-06-20 NOTE — Anesthesia Preprocedure Evaluation (Signed)
Anesthesia Evaluation  Patient identified by MRN, date of birth, ID band Patient awake    Reviewed: Allergy & Precautions, NPO status , Patient's Chart, lab work & pertinent test results  History of Anesthesia Complications Negative for: history of anesthetic complications  Airway Mallampati: III  TM Distance: <3 FB     Dental  (+) Loose   Pulmonary sleep apnea and Continuous Positive Airway Pressure Ventilation , former smoker,    Pulmonary exam normal        Cardiovascular hypertension, Normal cardiovascular exam     Neuro/Psych Anxiety Depression    GI/Hepatic Neg liver ROS, GERD  Medicated and Poorly Controlled,  Endo/Other  negative endocrine ROS  Renal/GU negative Renal ROS     Musculoskeletal  (+) Arthritis ,   Abdominal (+) + obese,   Peds  Hematology   Anesthesia Other Findings   Reproductive/Obstetrics                            Anesthesia Physical  Anesthesia Plan  ASA: III  Anesthesia Plan: General   Post-op Pain Management:    Induction: Intravenous, Rapid sequence and Cricoid pressure planned  PONV Risk Score and Plan: 4 or greater and Ondansetron, Dexamethasone, Midazolam, Scopolamine patch - Pre-op and Propofol infusion  Airway Management Planned: Oral ETT  Additional Equipment:   Intra-op Plan:   Post-operative Plan: Extubation in OR  Informed Consent: I have reviewed the patients History and Physical, chart, labs and discussed the procedure including the risks, benefits and alternatives for the proposed anesthesia with the patient or authorized representative who has indicated his/her understanding and acceptance.   Dental advisory given  Plan Discussed with: CRNA and Surgeon  Anesthesia Plan Comments:         Anesthesia Quick Evaluation

## 2017-06-20 NOTE — Transfer of Care (Signed)
Immediate Anesthesia Transfer of Care Note  Patient: Sandra Wilkins  Procedure(s) Performed: Procedure(s): ENDOSCOPIC RETROGRADE CHOLANGIOPANCREATOGRAPHY (ERCP) STENT REMOVAL (N/A)  Patient Location: PACU  Anesthesia Type:General  Level of Consciousness: awake, alert , oriented and patient cooperative  Airway & Oxygen Therapy: Patient Spontanous Breathing and Patient connected to face mask oxygen  Post-op Assessment: Report given to RN and Post -op Vital signs reviewed and stable  Post vital signs: Reviewed and stable  Last Vitals:  Vitals:   06/20/17 1045 06/20/17 1248  BP: 127/70 124/85  Pulse: 74 73  Resp: 20 17  Temp: (!) 35.9 C     Last Pain:  Vitals:   06/20/17 1248  TempSrc: Temporal  PainSc: 4       Patients Stated Pain Goal: 2 (21/30/86 5784)  Complications: No apparent anesthesia complications

## 2017-06-20 NOTE — Anesthesia Procedure Notes (Signed)
Procedure Name: Intubation Date/Time: 06/20/2017 11:23 AM Performed by: Darlyne Russian Pre-anesthesia Checklist: Patient identified, Emergency Drugs available, Suction available, Patient being monitored and Timeout performed Patient Re-evaluated:Patient Re-evaluated prior to induction Oxygen Delivery Method: Circle system utilized Preoxygenation: Pre-oxygenation with 100% oxygen Induction Type: IV induction Ventilation: Mask ventilation with difficulty and Oral airway inserted - appropriate to patient size Laryngoscope Size: Mac and 3 Grade View: Grade III Tube type: Oral Number of attempts: 3 Airway Equipment and Method: Stylet and Bougie stylet Placement Confirmation: ETT inserted through vocal cords under direct vision,  positive ETCO2 and breath sounds checked- equal and bilateral Secured at: 19 cm Tube secured with: Tape Dental Injury: Teeth and Oropharynx as per pre-operative assessment  Difficulty Due To: Difficulty was anticipated Future Recommendations: Recommend- induction with short-acting agent, and alternative techniques readily available

## 2017-06-21 ENCOUNTER — Encounter: Payer: Self-pay | Admitting: Gastroenterology

## 2017-07-07 ENCOUNTER — Other Ambulatory Visit: Payer: Self-pay | Admitting: Family Medicine

## 2017-07-07 NOTE — Telephone Encounter (Signed)
[facility] faxed a refill request on the following medications:  sertraline (ZOLOFT) 100 MG tablet.  Take 1 tabley by mouth 2 times daily.  180 tablets  [facility] [address]

## 2017-07-07 NOTE — Telephone Encounter (Signed)
Pharmacy requesting 180 tablets. Please review. Thanks!

## 2017-07-10 ENCOUNTER — Other Ambulatory Visit: Payer: Self-pay | Admitting: Family Medicine

## 2017-07-10 NOTE — Telephone Encounter (Signed)
[facility] faxed a 90-days supply for the following medication. Thanks CC  sertraline (ZOLOFT) 100 MG tablet  >Take 1 tablet (100 MG Total) by mouth 2 (two) times daily.

## 2017-07-10 NOTE — Telephone Encounter (Signed)
OK to change to 90 day supply.

## 2017-07-11 MED ORDER — SERTRALINE HCL 100 MG PO TABS: 100.0000 mg | ORAL_TABLET | Freq: Two times a day (BID) | ORAL | 3 refills | Status: DC

## 2017-07-11 NOTE — Telephone Encounter (Signed)
90 days supply sent to [facility]. Okay per Simona Huh in refill request message on 07/07/17.

## 2017-07-12 ENCOUNTER — Ambulatory Visit (INDEPENDENT_AMBULATORY_CARE_PROVIDER_SITE_OTHER): Payer: BLUE CROSS/BLUE SHIELD | Admitting: Family Medicine

## 2017-07-12 ENCOUNTER — Encounter: Payer: Self-pay | Admitting: Family Medicine

## 2017-07-12 VITALS — BP 112/70 | HR 97 | Temp 97.7°F | Resp 16 | Wt 248.0 lb

## 2017-07-12 DIAGNOSIS — R197 Diarrhea, unspecified: Secondary | ICD-10-CM

## 2017-07-12 DIAGNOSIS — S161XXS Strain of muscle, fascia and tendon at neck level, sequela: Secondary | ICD-10-CM

## 2017-07-12 DIAGNOSIS — R5383 Other fatigue: Secondary | ICD-10-CM

## 2017-07-12 DIAGNOSIS — R609 Edema, unspecified: Secondary | ICD-10-CM | POA: Diagnosis not present

## 2017-07-12 DIAGNOSIS — L659 Nonscarring hair loss, unspecified: Secondary | ICD-10-CM | POA: Diagnosis not present

## 2017-07-12 DIAGNOSIS — R109 Unspecified abdominal pain: Secondary | ICD-10-CM

## 2017-07-12 MED ORDER — TIZANIDINE HCL 2 MG PO CAPS: 2.0000 mg | ORAL_CAPSULE | Freq: Three times a day (TID) | ORAL | 2 refills | Status: DC

## 2017-07-12 NOTE — Patient Instructions (Signed)
  There are no discontinued medications.  Meds ordered this encounter  Medications  . tizanidine (ZANAFLEX) 2 MG capsule    Sig: Take 1 capsule (2 mg total) by mouth 3 (three) times daily.    Dispense:  30 capsule    Refill:  2    Adult vaccines due  Topic Date Due  . TETANUS/TDAP  08/08/1986    Health Maintenance Due  Topic Date Due  . HIV Screening  08/08/1982  . TETANUS/TDAP  08/08/1986  . INFLUENZA VACCINE  06/14/2017

## 2017-07-12 NOTE — Progress Notes (Signed)
Patient: Sandra Wilkins Female    DOB: [birth year]   51 y.o.   MRN: [id number] Visit Date: 07/12/2017  Today's Provider: Lelon Huh, MD   Chief Complaint  Patient presents with  . Abdominal Pain    x 3 weeks   Subjective:    Abdominal Pain  This is a new problem. Episode onset: 3 weeks ago. The problem occurs constantly. The problem has been unchanged. The pain is located in the epigastric region. The quality of the pain is sharp. Associated symptoms include diarrhea (occurs after eating), headaches and nausea. Pertinent negatives include no anorexia, belching, constipation, dysuria, fever, hematuria or vomiting.  Symptoms started after her last ERCP for stone and stent removal 3 weeks ago. She had cholecystectomy in June. Patient has also noticed hair loss, fatigue, neck and shoulder pain, weight gain and pain near the sternal area.   Of note is that she was treated for h. Pylori when she was initially worked up for abdominal pain in May.     Allergies  Allergen Reactions  . Codeine Hives  . Fish Oil Other (See Comments)    Indigestion Indigestion  . Other Hives and Itching    Metronidazole or Ciprofloxacin    . Sulfa Antibiotics Hives and Itching  . Tramadol Other (See Comments)    Hallucinations  . Penicillins Rash    Has patient had a PCN reaction causing immediate rash, facial/tongue/throat swelling, SOB or lightheadedness with hypotension: Unknown Has patient had a PCN reaction causing severe rash involving mucus membranes or skin necrosis: Unknown Has patient had a PCN reaction that required hospitalization: Unknown Has patient had a PCN reaction occurring within the last 10 years: No If all of the above answers are "NO", then may proceed with Cephalosporin use.      Current Outpatient Prescriptions:  .  ALPRAZolam (XANAX) 0.5 MG tablet, TAKE 1/2-2 TABLETS BY MOUTH EVERY 4 HOURS AS NEEDED FOR PANIC ATTACKS, Disp: 30 tablet, Rfl: 3 .  Biotin 5 MG CAPS, Take  2 capsules by mouth daily., Disp: , Rfl:  .  Ibuprofen 200 MG CAPS, Take 400 mg by mouth 2 (two) times daily as needed (PAIN). , Disp: , Rfl:  .  Multiple Vitamin (MULTIVITAMIN) tablet, Take 1 tablet by mouth daily., Disp: , Rfl:  .  omeprazole (PRILOSEC) 20 MG capsule, TAKE 1 CAPSULE (20 MG TOTAL) BY MOUTH TWICE DAILY, Disp: 60 capsule, Rfl: 11 .  sertraline (ZOLOFT) 100 MG tablet, Take 1 tablet (100 mg total) by mouth 2 (two) times daily., Disp: 180 tablet, Rfl: 3 .  cetirizine (ZYRTEC) 10 MG tablet, Take 10 mg by mouth every morning. NOT TAKING SINCE SHE HAS BEEN SICK WITH GALLBLADDER, Disp: , Rfl:   Review of Systems  Constitutional: Positive for diaphoresis, fatigue and unexpected weight change (weight gain). Negative for appetite change, chills and fever.  Respiratory: Negative for chest tightness and shortness of breath.   Cardiovascular: Positive for chest pain (near sternal area). Negative for palpitations.  Gastrointestinal: Positive for abdominal pain, diarrhea (occurs after eating) and nausea. Negative for anorexia, constipation and vomiting.  Genitourinary: Negative for dysuria and hematuria.  Musculoskeletal: Positive for neck pain and neck stiffness.  Neurological: Positive for headaches. Negative for dizziness and weakness.    Social History  Substance Use Topics  . Smoking status: Former Smoker    Packs/day: 0.50    Years: 20.00    Types: Cigarettes    Quit date: 11/13/2005  .  Smokeless tobacco: Never Used  . Alcohol use Yes     Comment: RARE   Objective:   BP 112/70 (BP Location: Left Arm, Patient Position: Sitting, Cuff Size: Large)   Pulse 97   Temp 97.7 F (36.5 C) (Oral)   Resp 16   Wt 248 lb (112.5 kg)   SpO2 95% Comment: room air  BMI 45.36 kg/m  Vitals:   07/12/17 1502  Resp: 16  Weight: 248 lb (112.5 kg)     Physical Exam  General Appearance:    Alert, cooperative, no distress  Eyes:    PERRL, conjunctiva/corneas clear, EOM's intact         Lungs:     Clear to auscultation bilaterally, respirations unlabored  Heart:    Regular rate and rhythm  Abd:   Tender left  paracervical and para-thoracic muscles with pain on left head rotation. No gross deformities.   Abdomen:   Obese. Mild epigastric tenderness. No masses. Normoactive bowel sounds.. No CVA tenderness        Assessment & Plan:     1. Fatigue, unspecified type  - TSH  2. Abdominal pain, unspecified abdominal location Treated for h. Pylori in May - CBC - Comprehensive metabolic panel - H. pylori antigen, stool  3. Edema, unspecified type  - TSH  4. . Diarrhea, unspecified type  - C difficile Toxins A+B W/Rflx - Fecal leukocytes - Stool culture - Ova and parasite examination  5. Alopecia TSH  6. Strain of neck muscle, sequela  - tizanidine (ZANAFLEX) 2 MG capsule; Take 1 capsule (2 mg total) by mouth 3 (three) times daily.  Dispense: 30 capsule; Refill: 2  Addressed extensive list of chronic and acute medical problems today requiring extensive time in counseling and coordination care.  Over half of this 45 minute visit were spent in counseling and coordinating care of multiple medical problems.       Lelon Huh, MD  [facility]

## 2017-07-13 ENCOUNTER — Other Ambulatory Visit: Payer: Self-pay | Admitting: Family Medicine

## 2017-07-13 DIAGNOSIS — R1011 Right upper quadrant pain: Secondary | ICD-10-CM

## 2017-07-13 DIAGNOSIS — R7401 Elevation of levels of liver transaminase levels: Secondary | ICD-10-CM

## 2017-07-13 DIAGNOSIS — R740 Nonspecific elevation of levels of transaminase and lactic acid dehydrogenase [LDH]: Secondary | ICD-10-CM

## 2017-07-13 LAB — CBC
HEMATOCRIT: 38.9 % (ref 34.0–46.6)
Hemoglobin: 13 g/dL (ref 11.1–15.9)
MCH: 27.7 pg (ref 26.6–33.0)
MCHC: 33.4 g/dL (ref 31.5–35.7)
MCV: 83 fL (ref 79–97)
PLATELETS: 211 10*3/uL (ref 150–379)
RBC: 4.69 x10E6/uL (ref 3.77–5.28)
RDW: 15.6 % — AB (ref 12.3–15.4)
WBC: 5.8 10*3/uL (ref 3.4–10.8)

## 2017-07-13 LAB — COMPREHENSIVE METABOLIC PANEL
A/G RATIO: 1.3 (ref 1.2–2.2)
ALT: 171 IU/L — ABNORMAL HIGH (ref 0–32)
AST: 32 IU/L (ref 0–40)
Albumin: 3.9 g/dL (ref 3.5–5.5)
Alkaline Phosphatase: 238 IU/L — ABNORMAL HIGH (ref 39–117)
BILIRUBIN TOTAL: 0.3 mg/dL (ref 0.0–1.2)
BUN/Creatinine Ratio: 12 (ref 9–23)
BUN: 13 mg/dL (ref 6–24)
CALCIUM: 8.6 mg/dL — AB (ref 8.7–10.2)
CHLORIDE: 103 mmol/L (ref 96–106)
CO2: 24 mmol/L (ref 20–29)
Creatinine, Ser: 1.1 mg/dL — ABNORMAL HIGH (ref 0.57–1.00)
GFR, EST AFRICAN AMERICAN: 68 mL/min/{1.73_m2} (ref >59)
GFR, EST NON AFRICAN AMERICAN: 59 mL/min/{1.73_m2} — AB (ref >59)
GLOBULIN, TOTAL: 2.9 g/dL (ref 1.5–4.5)
Glucose: 95 mg/dL (ref 65–99)
POTASSIUM: 3.9 mmol/L (ref 3.5–5.2)
SODIUM: 143 mmol/L (ref 134–144)
Total Protein: 6.8 g/dL (ref 6.0–8.5)

## 2017-07-13 LAB — TSH: TSH: 4.3 u[IU]/mL (ref 0.450–4.500)

## 2017-07-18 ENCOUNTER — Ambulatory Visit
Admission: RE | Admit: 2017-07-18 | Discharge: 2017-07-18 | Disposition: A | Discharge status: Home / Self Care | Payer: BLUE CROSS/BLUE SHIELD | Source: Ambulatory Visit | Attending: Family Medicine | Admitting: Family Medicine

## 2017-07-18 DIAGNOSIS — Z9049 Acquired absence of other specified parts of digestive tract: Secondary | ICD-10-CM | POA: Diagnosis not present

## 2017-07-18 DIAGNOSIS — R1011 Right upper quadrant pain: Secondary | ICD-10-CM | POA: Diagnosis not present

## 2017-07-18 DIAGNOSIS — R932 Abnormal findings on diagnostic imaging of liver and biliary tract: Secondary | ICD-10-CM | POA: Insufficient documentation

## 2017-07-18 DIAGNOSIS — R740 Nonspecific elevation of levels of transaminase and lactic acid dehydrogenase [LDH]: Secondary | ICD-10-CM | POA: Diagnosis not present

## 2017-07-18 DIAGNOSIS — R7401 Elevation of levels of liver transaminase levels: Secondary | ICD-10-CM

## 2017-07-22 LAB — OVA AND PARASITE EXAMINATION

## 2017-07-22 LAB — STOOL CULTURE: E COLI SHIGA TOXIN ASSAY: NEGATIVE

## 2017-07-22 LAB — C DIFFICILE, CYTOTOXIN B

## 2017-07-22 LAB — FECAL LEUKOCYTES

## 2017-07-22 LAB — H. PYLORI ANTIGEN, STOOL: H pylori Ag, Stl: NEGATIVE

## 2017-07-22 LAB — C DIFFICILE TOXINS A+B W/RFLX: C DIFFICILE TOXINS A+B, EIA: NEGATIVE

## 2017-07-26 ENCOUNTER — Encounter: Payer: Self-pay | Admitting: Certified Nurse Midwife

## 2017-08-09 ENCOUNTER — Ambulatory Visit (INDEPENDENT_AMBULATORY_CARE_PROVIDER_SITE_OTHER): Payer: BLUE CROSS/BLUE SHIELD | Admitting: Certified Nurse Midwife

## 2017-08-09 ENCOUNTER — Encounter: Payer: Self-pay | Admitting: Certified Nurse Midwife

## 2017-08-09 VITALS — BP 122/75 | HR 67 | Ht 62.0 in | Wt 255.6 lb

## 2017-08-09 DIAGNOSIS — Z1239 Encounter for other screening for malignant neoplasm of breast: Secondary | ICD-10-CM

## 2017-08-09 DIAGNOSIS — N939 Abnormal uterine and vaginal bleeding, unspecified: Secondary | ICD-10-CM

## 2017-08-09 DIAGNOSIS — Z1231 Encounter for screening mammogram for malignant neoplasm of breast: Secondary | ICD-10-CM

## 2017-08-09 DIAGNOSIS — Z0000 Encounter for general adult medical examination without abnormal findings: Secondary | ICD-10-CM

## 2017-08-09 NOTE — Patient Instructions (Signed)
Preventive Care 40-64 Years, Female Preventive care refers to lifestyle choices and visits with your health care provider that can promote health and wellness. What does preventive care include?  A yearly physical exam. This is also called an annual well check.  Dental exams once or twice a year.  Routine eye exams. Ask your health care provider how often you should have your eyes checked.  Personal lifestyle choices, including: ? Daily care of your teeth and gums. ? Regular physical activity. ? Eating a healthy diet. ? Avoiding tobacco and drug use. ? Limiting alcohol use. ? Practicing safe sex. ? Taking low-dose aspirin daily starting at age 58. ? Taking vitamin and mineral supplements as recommended by your health care provider. What happens during an annual well check? The services and screenings done by your health care provider during your annual well check will depend on your age, overall health, lifestyle risk factors, and family history of disease. Counseling Your health care provider may ask you questions about your:  Alcohol use.  Tobacco use.  Drug use.  Emotional well-being.  Home and relationship well-being.  Sexual activity.  Eating habits.  Work and work Statistician.  Method of birth control.  Menstrual cycle.  Pregnancy history.  Screening You may have the following tests or measurements:  Height, weight, and BMI.  Blood pressure.  Lipid and cholesterol levels. These may be checked every 5 years, or more frequently if you are over 81 years old.  Skin check.  Lung cancer screening. You may have this screening every year starting at age 78 if you have a 30-pack-year history of smoking and currently smoke or have quit within the past 15 years.  Fecal occult blood test (FOBT) of the stool. You may have this test every year starting at age 65.  Flexible sigmoidoscopy or colonoscopy. You may have a sigmoidoscopy every 5 years or a colonoscopy  every 10 years starting at age 30.  Hepatitis C blood test.  Hepatitis B blood test.  Sexually transmitted disease (STD) testing.  Diabetes screening. This is done by checking your blood sugar (glucose) after you have not eaten for a while (fasting). You may have this done every 1-3 years.  Mammogram. This may be done every 1-2 years. Talk to your health care provider about when you should start having regular mammograms. This may depend on whether you have a family history of breast cancer.  BRCA-related cancer screening. This may be done if you have a family history of breast, ovarian, tubal, or peritoneal cancers.  Pelvic exam and Pap test. This may be done every 3 years starting at age 80. Starting at age 36, this may be done every 5 years if you have a Pap test in combination with an HPV test.  Bone density scan. This is done to screen for osteoporosis. You may have this scan if you are at high risk for osteoporosis.  Discuss your test results, treatment options, and if necessary, the need for more tests with your health care provider. Vaccines Your health care provider may recommend certain vaccines, such as:  Influenza vaccine. This is recommended every year.  Tetanus, diphtheria, and acellular pertussis (Tdap, Td) vaccine. You may need a Td booster every 10 years.  Varicella vaccine. You may need this if you have not been vaccinated.  Zoster vaccine. You may need this after age 5.  Measles, mumps, and rubella (MMR) vaccine. You may need at least one dose of MMR if you were born in  1957 or later. You may also need a second dose.  Pneumococcal 13-valent conjugate (PCV13) vaccine. You may need this if you have certain conditions and were not previously vaccinated.  Pneumococcal polysaccharide (PPSV23) vaccine. You may need one or two doses if you smoke cigarettes or if you have certain conditions.  Meningococcal vaccine. You may need this if you have certain  conditions.  Hepatitis A vaccine. You may need this if you have certain conditions or if you travel or work in places where you may be exposed to hepatitis A.  Hepatitis B vaccine. You may need this if you have certain conditions or if you travel or work in places where you may be exposed to hepatitis B.  Haemophilus influenzae type b (Hib) vaccine. You may need this if you have certain conditions.  Talk to your health care provider about which screenings and vaccines you need and how often you need them. This information is not intended to replace advice given to you by your health care provider. Make sure you discuss any questions you have with your health care provider. Document Released: 11/27/2015 Document Revised: 07/20/2016 Document Reviewed: 09/01/2015 Elsevier Interactive Patient Education  2017 [employer].

## 2017-08-09 NOTE — Addendum Note (Signed)
Addended by: Earl Lagos on: 08/09/2017 05:10 PM   Modules accepted: Orders

## 2017-08-09 NOTE — Progress Notes (Addendum)
GYNECOLOGY ANNUAL PREVENTATIVE CARE ENCOUNTER NOTE  Subjective:   Sandra Wilkins is a 51 y.o. G0P0000 female here for a routine annual gynecologic exam.  Current complaints: abnormal uterine bleeding.  Stating that she stopped having periods 6 yrs ago for 2 yrs then had a heavy period that lasted for 11 day. She has had irregular spotting since then. The spotting is unpredictable , it is not monthly. She sometimes has to wear a panty liner. She denies cramps with the spotting.  Denies pelvic pain, problems with intercourse or other gynecologic concerns.    Gynecologic History No LMP recorded. Patient is not currently having periods (Reason: Irregular Periods). Contraception: none Last Pap: 10/14/2015. Results were: normal Last mammogram: 10/14/2015. Results were: normal  Obstetric History OB History  Gravida Para Term Preterm AB Living  0 0 0 0 0 0  SAB TAB Ectopic Multiple Live Births  0 0 0 0 0      Obstetric Comments  1st Menstrual Cycle:  12       Past Medical History:  Diagnosis Date  . Anxiety   . Arthritis    DDD-NECK  . Depression   . GERD (gastroesophageal reflux disease)   . Headache    MIGRAINES  . Hypertension    H/O WAS ON FUROSEMIDE IN THE PAST BUT LOST WEIGHT AND WAS TAKEN OFF DUE TO BP CONTROL  . Pre-diabetes   . Sleep apnea    CPAP    Past Surgical History:  Procedure Laterality Date  . BREAST BIOPSY Right 2012   benign  . CHOLECYSTECTOMY  04/17/2017   Procedure: CHOLECYSTECTOMY;  Surgeon: Robert Bellow, MD;  Location: [facility] ORS;  Service: General;;  Laparoscopic converted to open  . ERCP N/A 04/13/2017   Procedure: ENDOSCOPIC RETROGRADE CHOLANGIOPANCREATOGRAPHY (ERCP);  Surgeon: Lucilla Lame, MD;  Location: [facility] ENDOSCOPY;  Service: Endoscopy;  Laterality: N/A;  . ERCP N/A 06/20/2017   Procedure: ENDOSCOPIC RETROGRADE CHOLANGIOPANCREATOGRAPHY (ERCP) STENT REMOVAL;  Surgeon: Lucilla Lame, MD;  Location: [facility] ENDOSCOPY;  Service: Endoscopy;   Laterality: N/A;  . TONSILLECTOMY AND ADENOIDECTOMY  1972    Current Outpatient Prescriptions on File Prior to Visit  Medication Sig Dispense Refill  . ALPRAZolam (XANAX) 0.5 MG tablet TAKE 1/2-2 TABLETS BY MOUTH EVERY 4 HOURS AS NEEDED FOR PANIC ATTACKS 30 tablet 3  . Biotin 5 MG CAPS Take 2 capsules by mouth daily.    . cetirizine (ZYRTEC) 10 MG tablet Take 10 mg by mouth every morning. NOT TAKING SINCE SHE HAS BEEN SICK WITH GALLBLADDER    . Ibuprofen 200 MG CAPS Take 400 mg by mouth 2 (two) times daily as needed (PAIN).     . Multiple Vitamin (MULTIVITAMIN) tablet Take 1 tablet by mouth daily.    Marland Kitchen omeprazole (PRILOSEC) 20 MG capsule TAKE 1 CAPSULE (20 MG TOTAL) BY MOUTH TWICE DAILY 60 capsule 11  . sertraline (ZOLOFT) 100 MG tablet Take 1 tablet (100 mg total) by mouth 2 (two) times daily. 180 tablet 3  . tizanidine (ZANAFLEX) 2 MG capsule Take 1 capsule (2 mg total) by mouth 3 (three) times daily. 30 capsule 2   No current facility-administered medications on file prior to visit.     Allergies  Allergen Reactions  . Codeine Hives  . Fish Oil Other (See Comments)    Indigestion Indigestion  . Other Hives and Itching    Metronidazole or Ciprofloxacin    . Sulfa Antibiotics Hives and Itching  . Tramadol Other (See Comments)  Hallucinations  . Penicillins Rash    Has patient had a PCN reaction causing immediate rash, facial/tongue/throat swelling, SOB or lightheadedness with hypotension: Unknown Has patient had a PCN reaction causing severe rash involving mucus membranes or skin necrosis: Unknown Has patient had a PCN reaction that required hospitalization: Unknown Has patient had a PCN reaction occurring within the last 10 years: No If all of the above answers are "NO", then may proceed with Cephalosporin use.     Social History   Social History  . Marital status: Married    Spouse name: N/A  . Number of children: N/A  . Years of education: N/A   Occupational  History  . full time    Social History Main Topics  . Smoking status: Former Smoker    Packs/day: 0.50    Years: 20.00    Types: Cigarettes    Quit date: 11/13/2005  . Smokeless tobacco: Never Used  . Alcohol use Yes     Comment: RARE  . Drug use: No  . Sexual activity: Not Currently   Other Topics Concern  . Not on file   Social History Narrative  . No narrative on file    Family History  Problem Relation Age of Onset  . Hyperlipidemia Father   . Depression Sister   . Bipolar disorder Brother   . Seizures Brother   . Congestive Heart Failure Maternal Grandfather   . Dementia Paternal Grandfather   . Depression Sister   . Depression Mother   . Arthritis Mother   . Emphysema Mother   . Congestive Heart Failure Mother   . Arthritis Maternal Grandmother   . Asthma Maternal Grandmother     The following portions of the patient's history were reviewed and updated as appropriate: allergies, current medications, past family history, past medical history, past social history, past surgical history and problem list.  Review of Systems Constitutional: negative Eyes: negative Ears, nose, mouth, throat, and face: negative Respiratory: negative Cardiovascular: negative Gastrointestinal: negative Genitourinary:negative Integument/breast: negative Hematologic/lymphatic: negative Musculoskeletal:negative Neurological: negative Behavioral/Psych: negative Endocrine: negative Allergic/Immunologic: negative   Objective:  BP 122/75   Pulse 67   Ht 5\' 2"  (1.575 m)   Wt 255 lb 9.6 oz (115.9 kg)   BMI 46.75 kg/m  CONSTITUTIONAL: Well-developed, well-nourished , obese female in no acute distress.  HENT:  Normocephalic, atraumatic, External right and left ear normal. Oropharynx is clear and moist EYES: Conjunctivae and EOM are normal. Pupils are equal, round, and reactive to light. No scleral icterus.  NECK: Normal range of motion, supple, no masses.  Normal thyroid.  SKIN:  Skin is warm and dry. No rash noted. Not diaphoretic. No erythema. No pallor. NEUROLOGIC: Alert and oriented to person, place, and time. Normal reflexes, muscle tone coordination. No cranial nerve deficit noted. PSYCHIATRIC: Normal mood and affect. Normal behavior. Normal judgment and thought content. CARDIOVASCULAR: Normal heart rate noted, regular rhythm RESPIRATORY: Clear to auscultation bilaterally. Effort and breath sounds normal, no problems with respiration noted. BREASTS: Symmetric in size. No masses, skin changes, nipple drainage, or lymphadenopathy. ABDOMEN: Soft, normal bowel sounds, no distention noted.  No tenderness, rebound or guarding. Scar right side from gall bladder surgery PELVIC: Normal appearing external genitalia; normal appearing vaginal mucosa and cervix.  No abnormal discharge noted.  Pap smear not indicated. Uterine size-enlarged( Difficult to evaluate due to increased body mass), no other palpable masses, no uterine or adnexal tenderness. Exam compromised to body habitus MUSCULOSKELETAL: Normal range of motion. No tenderness.  No cyanosis, clubbing, or edema.  2+ distal pulses.   Assessment and Plan:  1. Abnormal uterine bleeding - [facility] - LH - US PELVIS (TRANSABDOMINAL ONLY); Future  2. Annual physical exam - Lipid Profile  Will follow up results of pap smear and manage accordingly. Mammogram scheduled Colonoscopy- pt declines at this time. " she will call when she is ready, I have to mentally prepare myself."  Routine preventative health maintenance measures emphasized. Please refer to After Visit Summary for other counseling recommendations.    Philip Aspen, CNM

## 2017-08-10 LAB — LIPID PANEL
CHOLESTEROL TOTAL: 188 mg/dL (ref 100–199)
Chol/HDL Ratio: 4.3 ratio (ref 0.0–4.4)
HDL: 44 mg/dL (ref >39)
LDL CALC: 98 mg/dL (ref 0–99)
TRIGLYCERIDES: 232 mg/dL — AB (ref 0–149)
VLDL Cholesterol Cal: 46 mg/dL — ABNORMAL HIGH (ref 5–40)

## 2017-08-10 LAB — FOLLICLE STIMULATING HORMONE: FSH: 28.5 m[IU]/mL

## 2017-08-10 LAB — LUTEINIZING HORMONE: LH: 24.4 m[IU]/mL

## 2017-08-28 ENCOUNTER — Other Ambulatory Visit: Payer: Self-pay | Admitting: Family Medicine

## 2017-08-28 DIAGNOSIS — F410 Panic disorder [episodic paroxysmal anxiety] without agoraphobia: Secondary | ICD-10-CM

## 2017-08-29 NOTE — Telephone Encounter (Signed)
Please call in alprazolam.  

## 2017-08-29 NOTE — Telephone Encounter (Signed)
rx called in-Sandra Wilkins [name], RMA  

## 2017-09-01 ENCOUNTER — Other Ambulatory Visit: Payer: Self-pay | Admitting: Family Medicine

## 2017-09-01 DIAGNOSIS — F410 Panic disorder [episodic paroxysmal anxiety] without agoraphobia: Secondary | ICD-10-CM

## 2017-09-01 NOTE — Telephone Encounter (Signed)
Please call in alprazolam.  

## 2017-09-01 NOTE — Telephone Encounter (Signed)
Rx called in to pharmacy. 

## 2017-09-06 ENCOUNTER — Encounter: Payer: Self-pay | Admitting: Family Medicine

## 2017-09-06 ENCOUNTER — Ambulatory Visit (INDEPENDENT_AMBULATORY_CARE_PROVIDER_SITE_OTHER): Payer: BLUE CROSS/BLUE SHIELD | Admitting: Family Medicine

## 2017-09-06 VITALS — BP 110/90 | HR 82 | Temp 98.0°F | Resp 16 | Ht 62.0 in | Wt 268.0 lb

## 2017-09-06 DIAGNOSIS — G8929 Other chronic pain: Secondary | ICD-10-CM

## 2017-09-06 DIAGNOSIS — M25572 Pain in left ankle and joints of left foot: Secondary | ICD-10-CM

## 2017-09-06 DIAGNOSIS — M542 Cervicalgia: Secondary | ICD-10-CM

## 2017-09-06 DIAGNOSIS — M25571 Pain in right ankle and joints of right foot: Secondary | ICD-10-CM | POA: Diagnosis not present

## 2017-09-06 DIAGNOSIS — R0600 Dyspnea, unspecified: Secondary | ICD-10-CM

## 2017-09-06 DIAGNOSIS — R600 Localized edema: Secondary | ICD-10-CM

## 2017-09-06 DIAGNOSIS — Z23 Encounter for immunization: Secondary | ICD-10-CM | POA: Diagnosis not present

## 2017-09-06 DIAGNOSIS — M545 Low back pain, unspecified: Secondary | ICD-10-CM

## 2017-09-06 DIAGNOSIS — E790 Hyperuricemia without signs of inflammatory arthritis and tophaceous disease: Secondary | ICD-10-CM | POA: Diagnosis not present

## 2017-09-06 NOTE — Progress Notes (Signed)
Patient: Sandra Wilkins Female    DOB: Oct 09, 1966   51 y.o.   MRN: [id number] Visit Date: 09/06/2017  Today's Provider: Lelon Huh, MD   Chief Complaint  Patient presents with  . Back Pain  . Foot Pain   Subjective:    Back Pain  This is a new problem. The current episode started more than 1 month ago (2 months). The problem occurs constantly. The problem is unchanged. The pain is present in the thoracic spine. The quality of the pain is described as stabbing and aching. Radiates to: from neck to spine. The pain is at a severity of 8/10. The pain is severe. The pain is worse during the night. The symptoms are aggravated by twisting, lying down, sitting and standing. Stiffness is present in the morning. Pertinent negatives include no abdominal pain, bladder incontinence, bowel incontinence, chest pain, dysuria, fever, headaches, leg pain, numbness, paresis, paresthesias, pelvic pain, perianal numbness, tingling, weakness or weight loss. She has tried NSAIDs (ibuprofen) for the symptoms. The treatment provided mild relief.  States pain in her ankles gets much worse when walking for 30 to 40 minutes which limits her activities. Not taking furosemide regularly. Tried colchicine last year which she didn't feel helped much.   Patient states that her feet have been swelling and painful for 2-3 weeks. Patient states she has had gout in the past but believes this pain is different. Also, patient has been having upper back pain for 2 months. Patient states pain is in her neck and radiates down to mid-back. Patient has been taking ibuprofen with only mild relief. Has improved since last visit and is taking muscle relaxer once a day. No unusual    Allergies  Allergen Reactions  . Codeine Hives  . Fish Oil Other (See Comments)    Indigestion Indigestion  . Other Hives and Itching    Metronidazole or Ciprofloxacin    . Sulfa Antibiotics Hives and Itching  . Tramadol Other (See Comments)      Hallucinations  . Penicillins Rash    Has patient had a PCN reaction causing immediate rash, facial/tongue/throat swelling, SOB or lightheadedness with hypotension: Unknown Has patient had a PCN reaction causing severe rash involving mucus membranes or skin necrosis: Unknown Has patient had a PCN reaction that required hospitalization: Unknown Has patient had a PCN reaction occurring within the last 10 years: No If all of the above answers are "NO", then may proceed with Cephalosporin use.      Current Outpatient Prescriptions:  .  ALPRAZolam (XANAX) 0.5 MG tablet, TAKE 1/2 TO 2 TABLETS BY MOUTH EVERY 4 HOURS AS NEEDED FOR PANIC ATTACKS, Disp: 30 tablet, Rfl: 5 .  Biotin 5 MG CAPS, Take 2 capsules by mouth daily., Disp: , Rfl:  .  cetirizine (ZYRTEC) 10 MG tablet, Take 10 mg by mouth every morning. NOT TAKING SINCE SHE HAS BEEN SICK WITH GALLBLADDER, Disp: , Rfl:  .  furosemide (LASIX) 20 MG tablet, Take 20 mg by mouth., Disp: , Rfl:  .  Ibuprofen 200 MG CAPS, Take 400 mg by mouth 2 (two) times daily as needed (PAIN). , Disp: , Rfl:  .  Multiple Vitamin (MULTIVITAMIN) tablet, Take 1 tablet by mouth daily., Disp: , Rfl:  .  omeprazole (PRILOSEC) 20 MG capsule, TAKE 1 CAPSULE (20 MG TOTAL) BY MOUTH TWICE DAILY, Disp: 60 capsule, Rfl: 11 .  sertraline (ZOLOFT) 100 MG tablet, Take 1 tablet (100 mg total) by mouth 2 (  two) times daily., Disp: 180 tablet, Rfl: 3 .  tizanidine (ZANAFLEX) 2 MG capsule, Take 1 capsule (2 mg total) by mouth 3 (three) times daily., Disp: 30 capsule, Rfl: 2  Review of Systems  Constitutional: Negative for appetite change, chills, fatigue, fever and weight loss.  Respiratory: Negative for chest tightness and shortness of breath.   Cardiovascular: Negative for chest pain and palpitations.  Gastrointestinal: Negative for abdominal pain, bowel incontinence, nausea and vomiting.  Genitourinary: Negative for bladder incontinence, dysuria and pelvic pain.   Musculoskeletal: Positive for back pain.  Neurological: Negative for dizziness, tingling, weakness, numbness, headaches and paresthesias.    Social History  Substance Use Topics  . Smoking status: Former Smoker    Packs/day: 0.50    Years: 20.00    Types: Cigarettes    Quit date: 11/13/2005  . Smokeless tobacco: Never Used  . Alcohol use Yes     Comment: RARE   Objective:   BP 110/90 (BP Location: Right Arm, Patient Position: Sitting, Cuff Size: Large)   Pulse 82   Temp 98 F (36.7 C) (Oral)   Resp 16   Ht 5\' 2"  (1.575 m)   Wt 268 lb (121.6 kg)   SpO2 97%   BMI 49.02 kg/m  Vitals:   09/06/17 1056  BP: 110/90  Pulse: 82  Resp: 16  Temp: 98 F (36.7 C)  TempSrc: Oral  SpO2: 97%  Weight: 268 lb (121.6 kg)  Height: 5\' 2"  (1.575 m)     Physical Exam   General Appearance:    Alert, cooperative, no distress  Eyes:    PERRL, conjunctiva/corneas clear, EOM's intact       Lungs:     Clear to auscultation bilaterally, respirations unlabored  Heart:    Regular rate and rhythm  Neurologic:   Awake, alert, oriented x 3. No apparent focal neurological           defect.   Ext:  3+ bilateral ankle edema with pain with passive ROM.        Assessment & Plan:     1. Acute bilateral ankle pain She does have history of elevated uric acid which may be contributing to chronic ankle pain and swelling. Consider allopurinol  - Uric acid  2. Hyperuricemia   3. Dyspnea, unspecified type Likely due to deconditioning, but need to rule out cardiac etiology.  - Brain natriuretic peptide  4. Localized edema   5. Chronic midline low back pain without sciatica   6. Neck pain   7. Need for influenza vaccination  - Flu Vaccine QUAD 36+ mos IM        Lelon Huh, MD  [facility]

## 2017-09-07 LAB — URIC ACID: Uric Acid, Serum: 6.5 mg/dL (ref 2.5–7.0)

## 2017-09-07 LAB — BRAIN NATRIURETIC PEPTIDE: BRAIN NATRIURETIC PEPTIDE: 8 pg/mL (ref <100)

## 2017-09-08 ENCOUNTER — Telehealth: Payer: Self-pay | Admitting: Family Medicine

## 2017-09-08 NOTE — Telephone Encounter (Signed)
Pt stated she was returning Michelle's call about results. Please advise. Thanks TNP

## 2017-09-12 NOTE — Telephone Encounter (Signed)
Patient was notified of results.  

## 2017-09-12 NOTE — Telephone Encounter (Signed)
LMOVM for pt to return call 

## 2017-09-21 ENCOUNTER — Encounter: Payer: Self-pay | Admitting: Family Medicine

## 2017-09-21 ENCOUNTER — Other Ambulatory Visit: Payer: Self-pay | Admitting: Certified Nurse Midwife

## 2017-09-21 DIAGNOSIS — N939 Abnormal uterine and vaginal bleeding, unspecified: Secondary | ICD-10-CM

## 2017-09-29 ENCOUNTER — Other Ambulatory Visit: Payer: Self-pay

## 2017-09-30 ENCOUNTER — Encounter: Payer: Self-pay | Admitting: Family Medicine

## 2017-10-04 ENCOUNTER — Other Ambulatory Visit: Payer: Self-pay | Admitting: Family Medicine

## 2017-10-04 DIAGNOSIS — S161XXS Strain of muscle, fascia and tendon at neck level, sequela: Secondary | ICD-10-CM

## 2017-10-15 ENCOUNTER — Encounter: Payer: Self-pay | Admitting: Family Medicine

## 2017-10-16 ENCOUNTER — Ambulatory Visit (INDEPENDENT_AMBULATORY_CARE_PROVIDER_SITE_OTHER): Payer: BLUE CROSS/BLUE SHIELD

## 2017-10-16 DIAGNOSIS — N939 Abnormal uterine and vaginal bleeding, unspecified: Secondary | ICD-10-CM

## 2017-10-18 ENCOUNTER — Telehealth: Payer: Self-pay | Admitting: Family Medicine

## 2017-10-18 ENCOUNTER — Encounter: Payer: Self-pay | Admitting: Certified Nurse Midwife

## 2017-10-18 NOTE — Telephone Encounter (Signed)
LMTCB

## 2017-10-18 NOTE — Telephone Encounter (Signed)
Patient sent e-mail message requesting referral to pain clinic. We need to see her in office first and get some up to date imaging studies.

## 2017-10-19 NOTE — Telephone Encounter (Signed)
Pt returned call. Pt was advised below and is scheduled for OV on 10/25/17. Thanks TNP

## 2017-10-19 NOTE — Telephone Encounter (Signed)
Noted  

## 2017-10-24 NOTE — Progress Notes (Signed)
Patient: Sandra Wilkins Female    DOB: 07/01/1966   51 y.o.   MRN: [id number] Visit Date: 10/25/2017  Today's Provider: Lelon Huh, MD   Chief Complaint  Patient presents with  . Neck Pain  . Back Pain   Subjective:    Neck Pain   This is a new problem. The current episode started more than 1 month ago. The problem occurs constantly. The problem has been gradually worsening. The pain is associated with a fall. The pain is present in the left side and right side. The quality of the pain is described as aching and shooting. The pain is moderate (can be severe). The symptoms are aggravated by bending. The pain is same all the time. Stiffness is present in the morning. Pertinent negatives include no chest pain, fever, numbness, tingling or weakness. She has tried home exercises, heat, ice and muscle relaxants for the symptoms. The treatment provided mild relief.  Back Pain  This is a new problem. The current episode started more than 1 month ago. The problem occurs constantly. The problem has been gradually worsening since onset. The pain is present in the thoracic spine. The quality of the pain is described as aching and shooting. The pain is moderate (to severe). The pain is the same all the time. The symptoms are aggravated by bending, lying down and sitting. Stiffness is present in the morning. Pertinent negatives include no abdominal pain, chest pain, fever, numbness, tingling or weakness. She has tried heat, bed rest, home exercises, muscle relaxant and NSAIDs for the symptoms. The treatment provided mild relief.   Patient wants to be referred to a specialist to help with her pain. She has difficulty preforming ADLs due to her neck and back pain. Her pain is also affecting her sleep and causing her to be more fatigued than usual. She also had a fall about 1 month ago at work making her symptoms worse.   She has similar pain in her neck in October and was prescribed tizanidine. She  feels it does help but makes her very fatigued. She also take up to 4 OTC ibuprofen which relieves pain for a few hours.     Allergies  Allergen Reactions  . Codeine Hives  . Fish Oil Other (See Comments)    Indigestion Indigestion  . Other Hives and Itching    Metronidazole or Ciprofloxacin    . Sulfa Antibiotics Hives and Itching  . Tramadol Other (See Comments)    Hallucinations  . Penicillins Rash    Has patient had a PCN reaction causing immediate rash, facial/tongue/throat swelling, SOB or lightheadedness with hypotension: Unknown Has patient had a PCN reaction causing severe rash involving mucus membranes or skin necrosis: Unknown Has patient had a PCN reaction that required hospitalization: Unknown Has patient had a PCN reaction occurring within the last 10 years: No If all of the above answers are "NO", then may proceed with Cephalosporin use.      Current Outpatient Medications:  .  ALPRAZolam (XANAX) 0.5 MG tablet, TAKE 1/2 TO 2 TABLETS BY MOUTH EVERY 4 HOURS AS NEEDED FOR PANIC ATTACKS, Disp: 30 tablet, Rfl: 5 .  Biotin 5 MG CAPS, Take 2 capsules by mouth daily., Disp: , Rfl:  .  cetirizine (ZYRTEC) 10 MG tablet, Take 10 mg by mouth every morning. NOT TAKING SINCE SHE HAS BEEN SICK WITH GALLBLADDER, Disp: , Rfl:  .  furosemide (LASIX) 20 MG tablet, Take 20 mg by mouth.,  Disp: , Rfl:  .  Ibuprofen 200 MG CAPS, Take 400 mg by mouth 2 (two) times daily as needed (PAIN). , Disp: , Rfl:  .  Multiple Vitamin (MULTIVITAMIN) tablet, Take 1 tablet by mouth daily., Disp: , Rfl:  .  omeprazole (PRILOSEC) 20 MG capsule, TAKE 1 CAPSULE (20 MG TOTAL) BY MOUTH TWICE DAILY, Disp: 60 capsule, Rfl: 11 .  sertraline (ZOLOFT) 100 MG tablet, Take 1 tablet (100 mg total) by mouth 2 (two) times daily., Disp: 180 tablet, Rfl: 3 .  tizanidine (ZANAFLEX) 2 MG capsule, TAKE 1 CAPSULE BY MOUTH THREE TIMES A DAY, Disp: 30 capsule, Rfl: 2  Review of Systems  Constitutional: Negative for appetite  change, chills, fatigue and fever.  Respiratory: Negative for chest tightness and shortness of breath.   Cardiovascular: Negative for chest pain and palpitations.  Gastrointestinal: Negative for abdominal pain, nausea and vomiting.  Musculoskeletal: Positive for back pain and neck pain.  Neurological: Negative for dizziness, tingling, weakness and numbness.    Social History   Tobacco Use  . Smoking status: Former Smoker    Packs/day: 0.50    Years: 20.00    Pack years: 10.00    Types: Cigarettes    Last attempt to quit: 11/13/2005    Years since quitting: 11.9  . Smokeless tobacco: Never Used  Substance Use Topics  . Alcohol use: Yes    Comment: RARE   Objective:   BP 122/70 (BP Location: Right Arm, Patient Position: Sitting, Cuff Size: Large)   Pulse 88   Temp 98.1 F (36.7 C)   Resp 16   Ht 5\' 2"  (1.575 m)   Wt 277 lb (125.6 kg)   SpO2 96%   BMI 50.66 kg/m  Vitals:   10/25/17 1337  BP: 122/70  Pulse: 88  Resp: 16  Temp: 98.1 F (36.7 C)  SpO2: 96%  Weight: 277 lb (125.6 kg)  Height: 5\' 2"  (1.575 m)     Physical Exam   General Appearance:    Alert, cooperative, no distress  Eyes:    PERRL, conjunctiva/corneas clear, EOM's intact       Lungs:     Clear to auscultation bilaterally, respirations unlabored  Heart:    Regular rate and rhythm  Neurologic:   Awake, alert, oriented x 3. No apparent focal neurological           defect.   MS:   Diffusely tender cervical, lumbar, and thoracic spine with diffuse tenderness para spinous muscles, no weakness of extremities. Tender multiple trigger pointes.       Assessment & Plan:     1. Neck pain  - DG Cervical Spine Complete; Future  2. Acute low back pain, unspecified back pain laterality, with sciatica presence unspecified  - DG Lumbar Spine Complete; Future  3. . Mid back pain  - DG Thoracic Spine 2 View; Future  No specific injury, may have fibromyalgia.     The entirety of the information  documented in the History of Present Illness, Review of Systems and Physical Exam were personally obtained by me. Portions of this information were initially documented by Wilburt Finlay, CMA and reviewed by me for thoroughness and accuracy.    Lelon Huh, MD  [facility]

## 2017-10-25 ENCOUNTER — Encounter: Payer: Self-pay | Admitting: Family Medicine

## 2017-10-25 ENCOUNTER — Ambulatory Visit: Payer: BLUE CROSS/BLUE SHIELD | Admitting: Family Medicine

## 2017-10-25 ENCOUNTER — Ambulatory Visit
Admission: RE | Admit: 2017-10-25 | Discharge: 2017-10-25 | Disposition: A | Discharge status: Home / Self Care | Payer: BLUE CROSS/BLUE SHIELD | Source: Ambulatory Visit | Attending: Family Medicine | Admitting: Family Medicine

## 2017-10-25 VITALS — BP 122/70 | HR 88 | Temp 98.1°F | Resp 16 | Ht 62.0 in | Wt 277.0 lb

## 2017-10-25 DIAGNOSIS — M549 Dorsalgia, unspecified: Secondary | ICD-10-CM

## 2017-10-25 DIAGNOSIS — M542 Cervicalgia: Secondary | ICD-10-CM | POA: Diagnosis not present

## 2017-10-25 DIAGNOSIS — M545 Low back pain: Secondary | ICD-10-CM

## 2017-10-25 DIAGNOSIS — Z1211 Encounter for screening for malignant neoplasm of colon: Secondary | ICD-10-CM

## 2017-10-25 IMAGING — CR DG CERVICAL SPINE COMPLETE 4+V
1 series · 7 of 7 positions shown · non-contrast
Comparison: Cervical spine series of [DATE]

CLINICAL DATA: Patient fell 4 weeks ago striking a chair and then
falling to the carpeted floor. Patient reports left-sided neck pain.

EXAM:
CERVICAL SPINE - COMPLETE 4+ VIEW

[Series 1: dg cervical spine complete · 0.14mm/px · 7 of 7 slices shown]
[im 1/7]
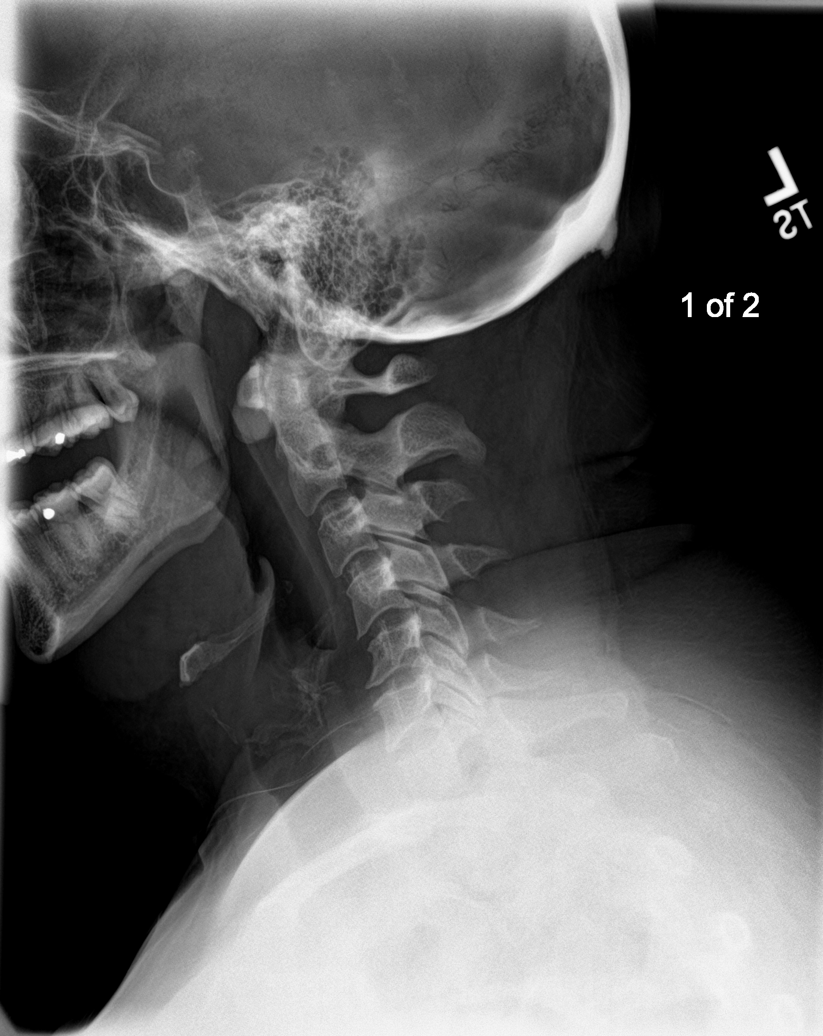
[im 2/7]
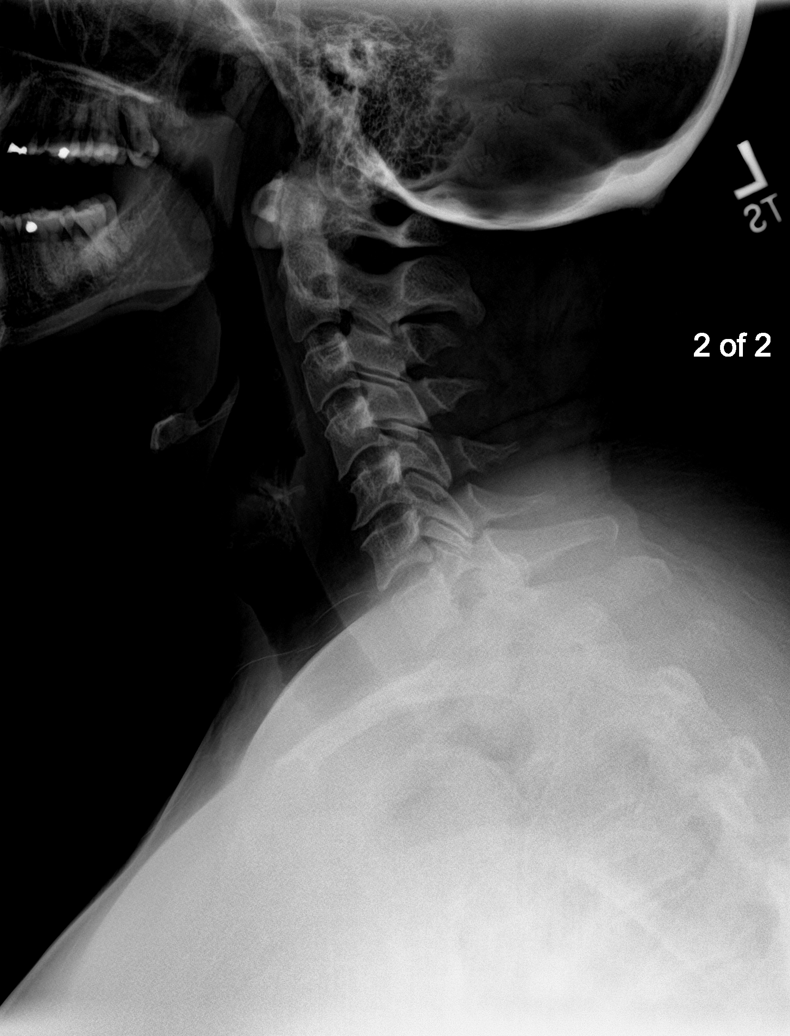
[im 3/7]
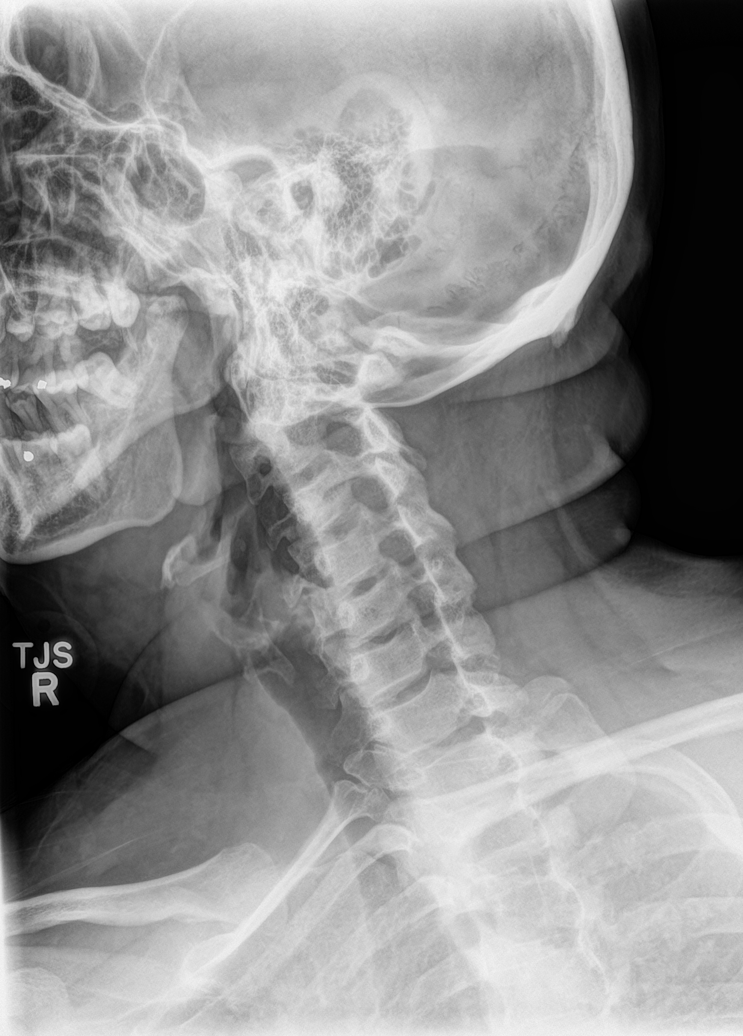
[im 4/7]
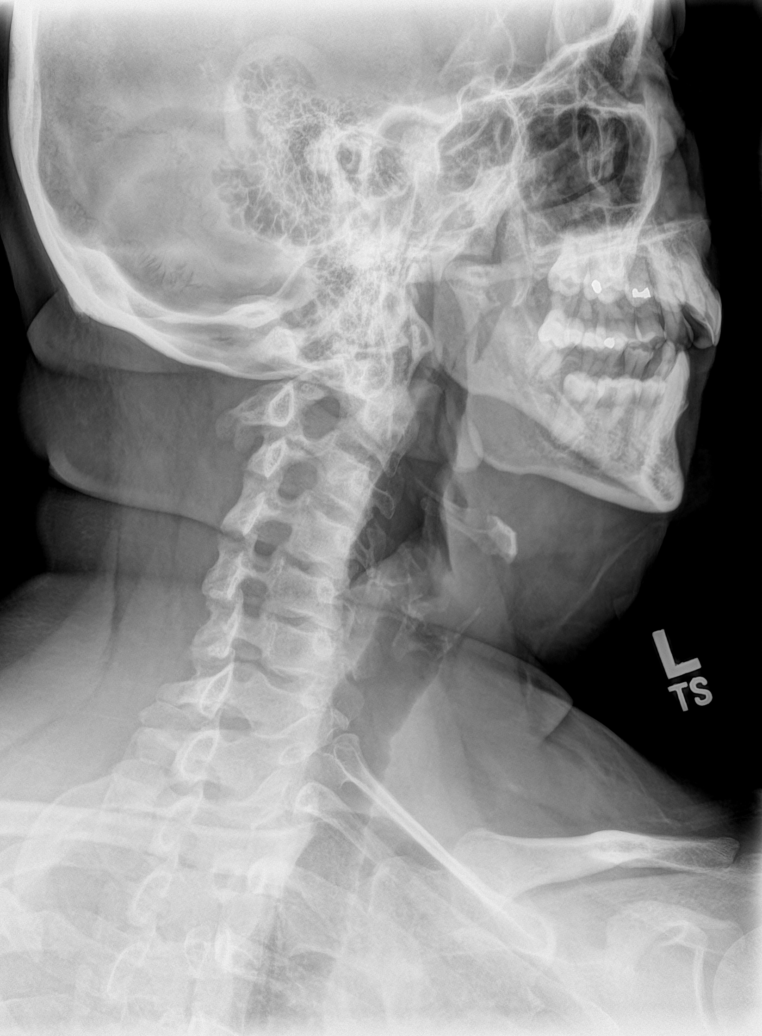
[im 5/7]
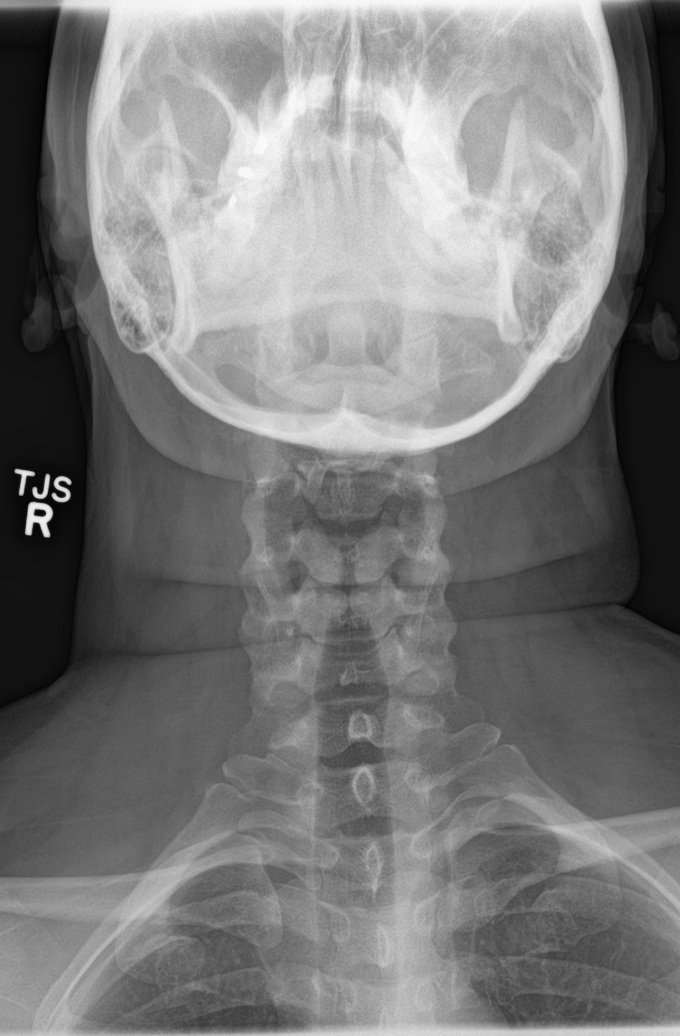
[im 6/7]
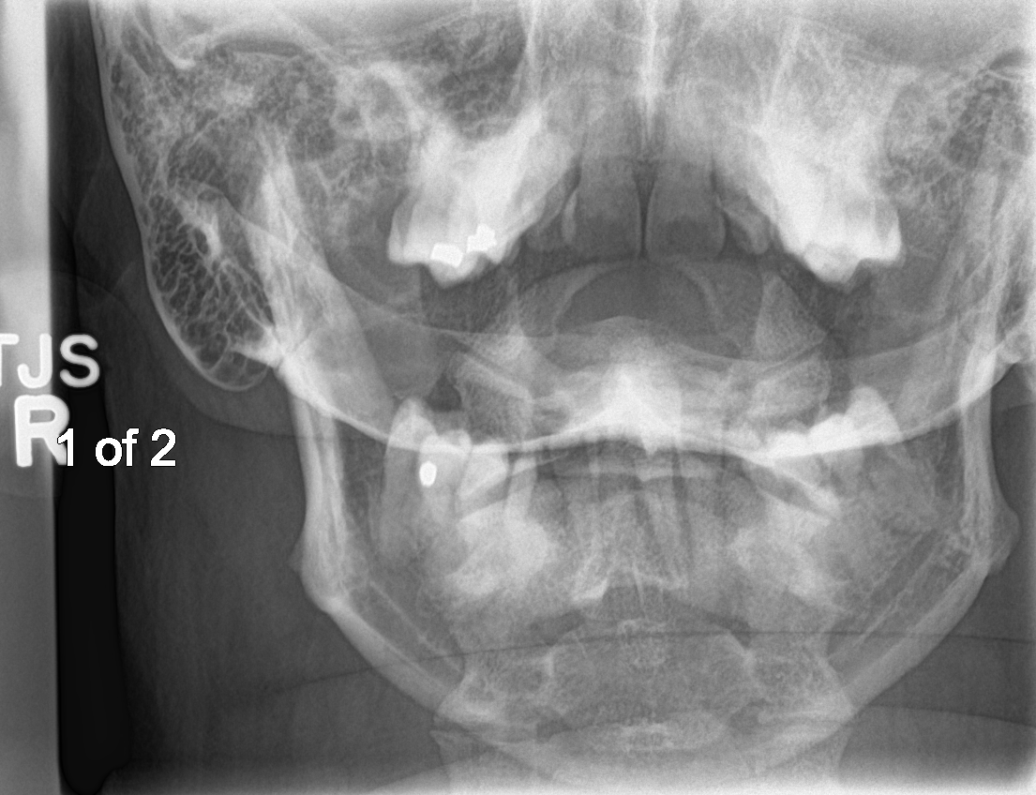
[im 7/7]
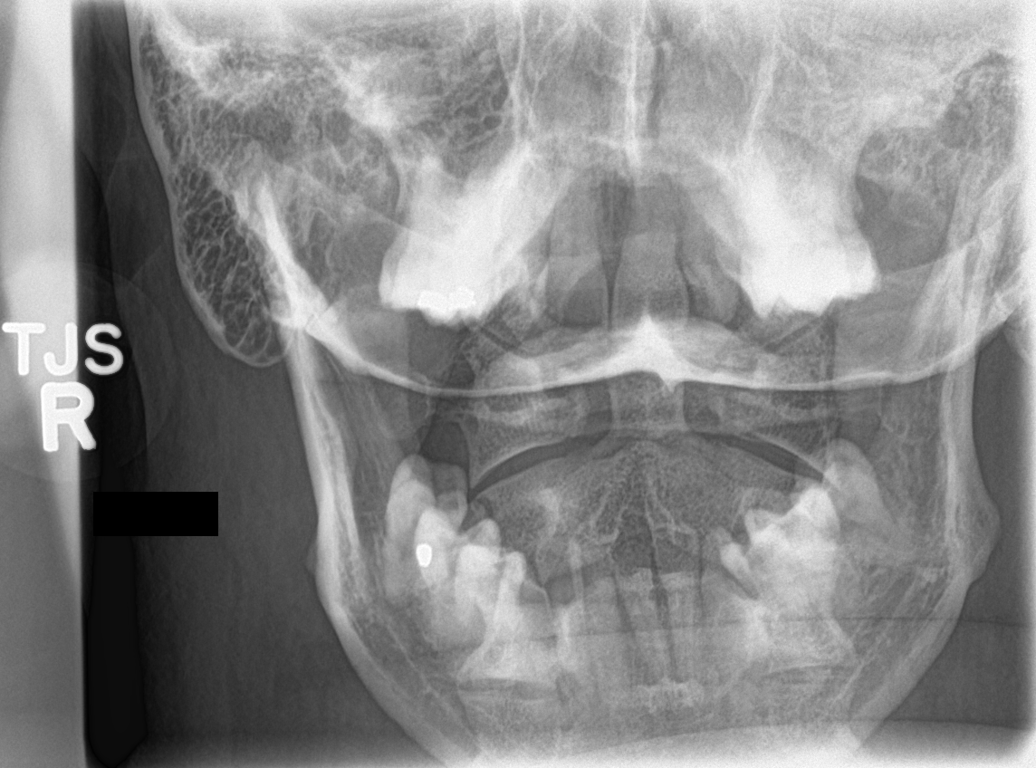

[7 of 7 positions shown; findings below may reference images not displayed]

FINDINGS: There is mild chronic loss of the normal cervical lordosis. The
cervical vertebral bodies are preserved in height. The disc space
heights are well maintained. There small anterior endplate
osteophytes at C4-5 and C5-6. There is no perched facet or spinous
process fracture. The prevertebral soft tissue spaces are normal.
There is no high-grade bony encroachment upon the neural foramina.
The odontoid is intact where visualized.
IMPRESSION: Mild degenerative endplate spurring at C4-5 and C5-6 little changed
from the previous study. Chronic mild reversal of the normal
cervical lordosis may reflect muscle spasm. No acute fracture nor
dislocation.

## 2017-10-25 IMAGING — CR DG THORACIC SPINE 2V
1 series · 4 of 4 positions shown · non-contrast
Comparison: Thoracic spine series dated [DATE]

CLINICAL DATA: Patient reports falling 1 month ago striking a chair
and in the carpeted floor. The patient is reporting thoracic back
pain between the shoulder blades.

EXAM:
THORACIC SPINE 2 VIEWS

[Series 1: dg thoracic spine 2 view · 0.14mm/px · 4 of 4 slices shown]
[im 1/4]
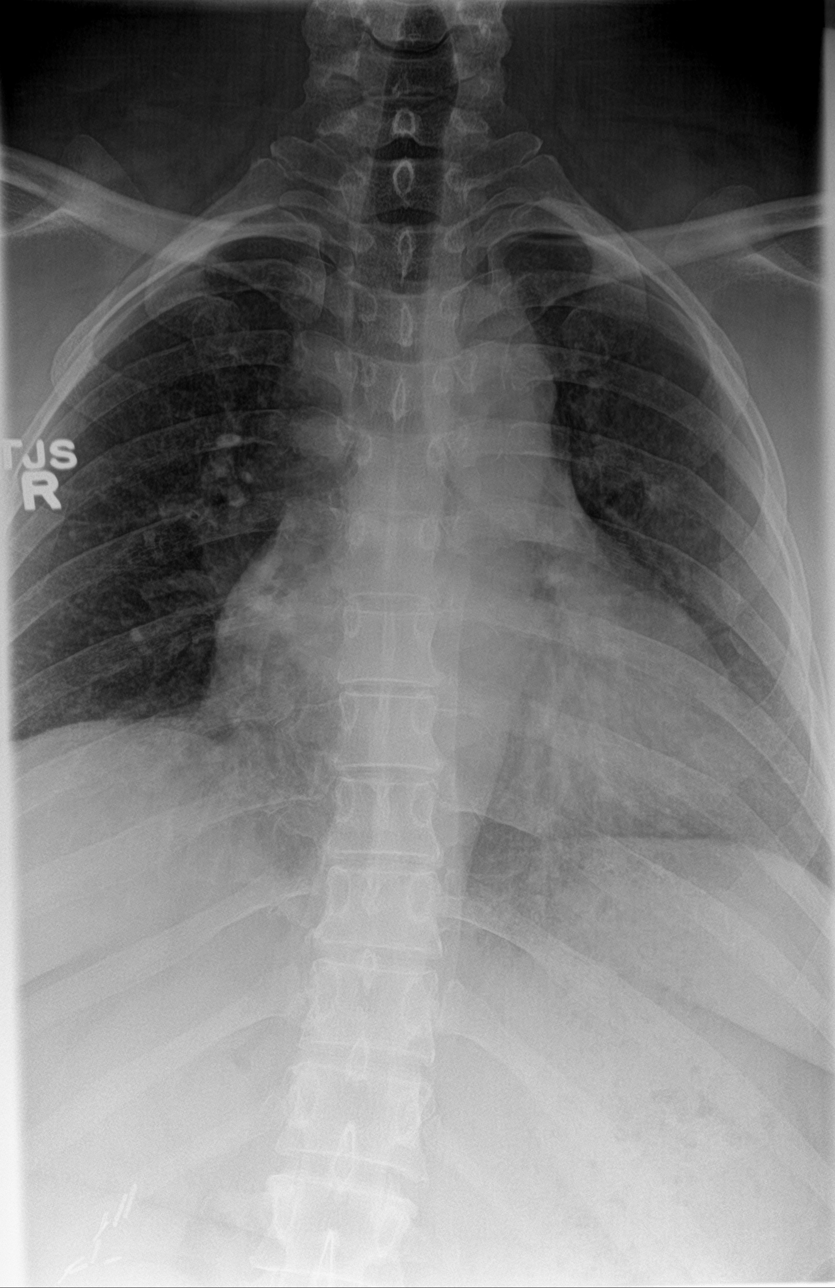
[im 2/4]
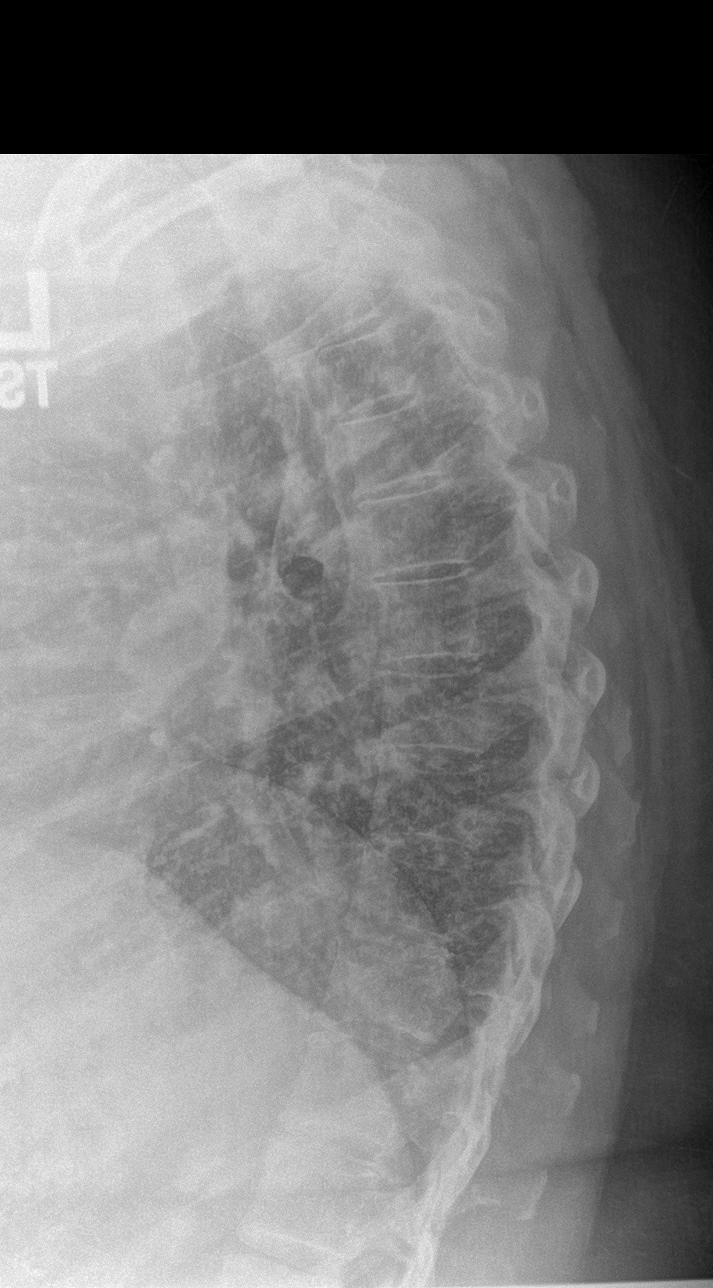
[im 3/4]
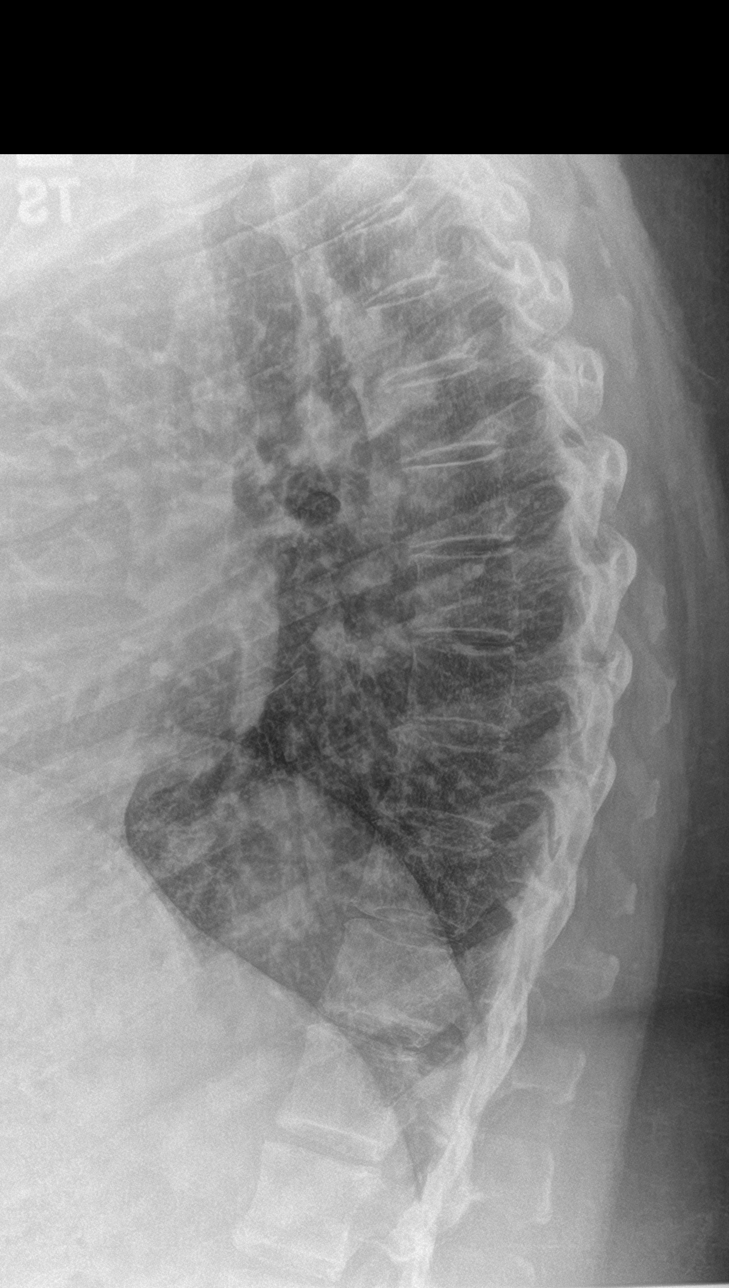
[im 4/4]
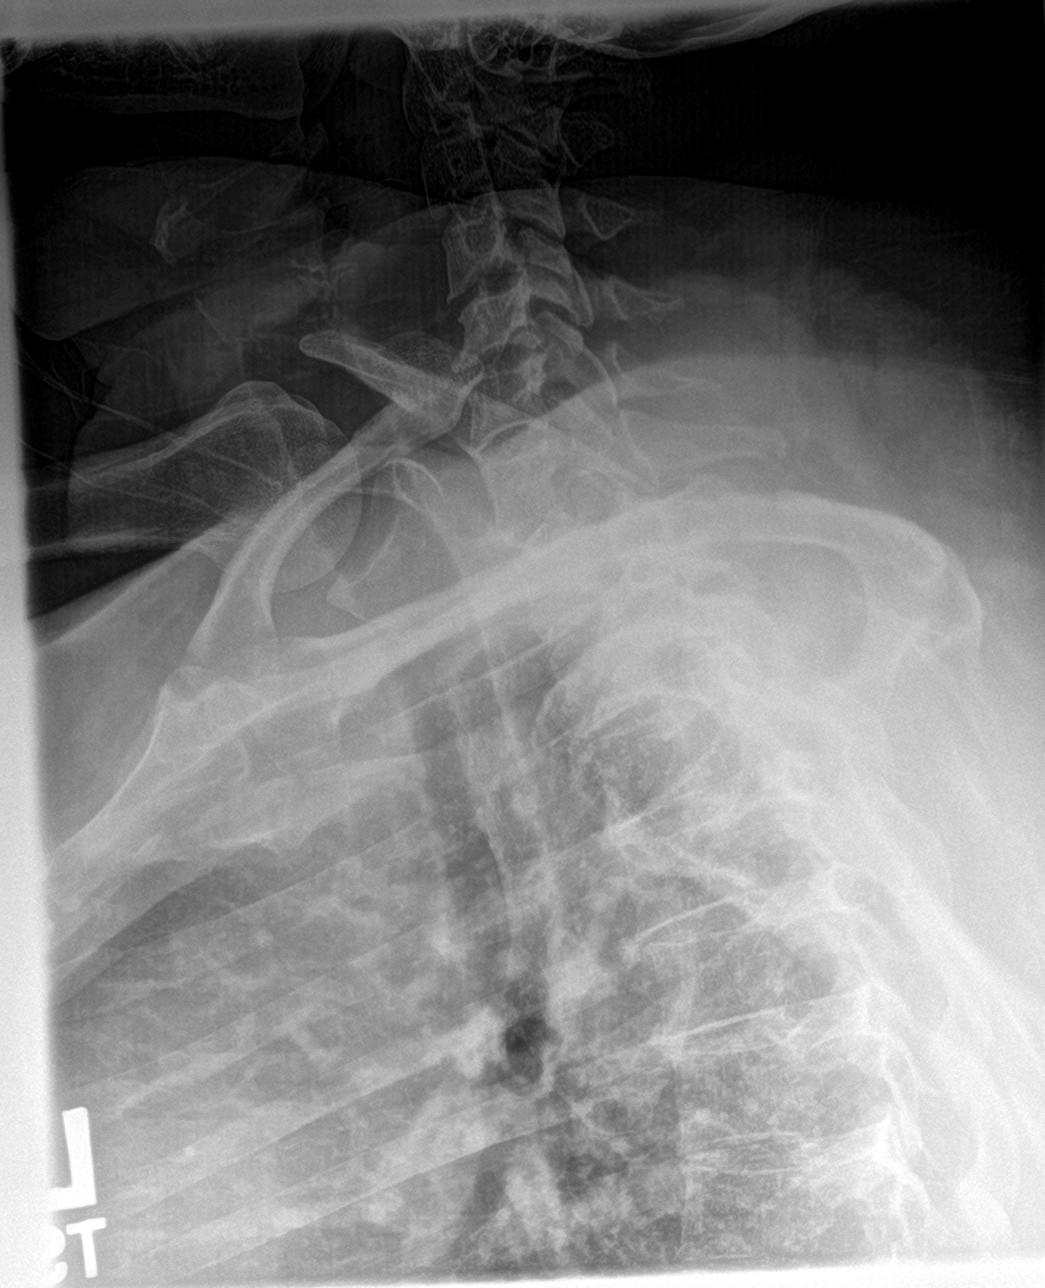

[4 of 4 positions shown; findings below may reference images not displayed]

FINDINGS: The thoracic vertebral bodies are preserved in height. The pedicles
are intact. There are no abnormal paravertebral soft tissue
densities. The disc space heights are well maintained. The
cervicothoracic and thoracolumbar junctions are unremarkable. There
is gentle curvature convex toward the right involving the T11 and
T12 levels and extending into the lumbar spine.
IMPRESSION: There is no acute fracture or other acute abnormality of the
thoracic spine. No significant degenerative disc disease is
observed.

## 2017-10-25 IMAGING — CR DG LUMBAR SPINE COMPLETE 4+V
1 series · 5 of 5 positions shown · non-contrast
Comparison: Lumbar spine series of [DATE]

CLINICAL DATA: Patient reports a fall 4 weeks ago striking a chair
in in the carpeted floor. Patient reports low back pain.

EXAM:
LUMBAR SPINE - COMPLETE 4+ VIEW

[Series 1: dg lumbar spine complete 4 +v · 0.14mm/px · 5 of 5 slices shown]
[im 1/5]
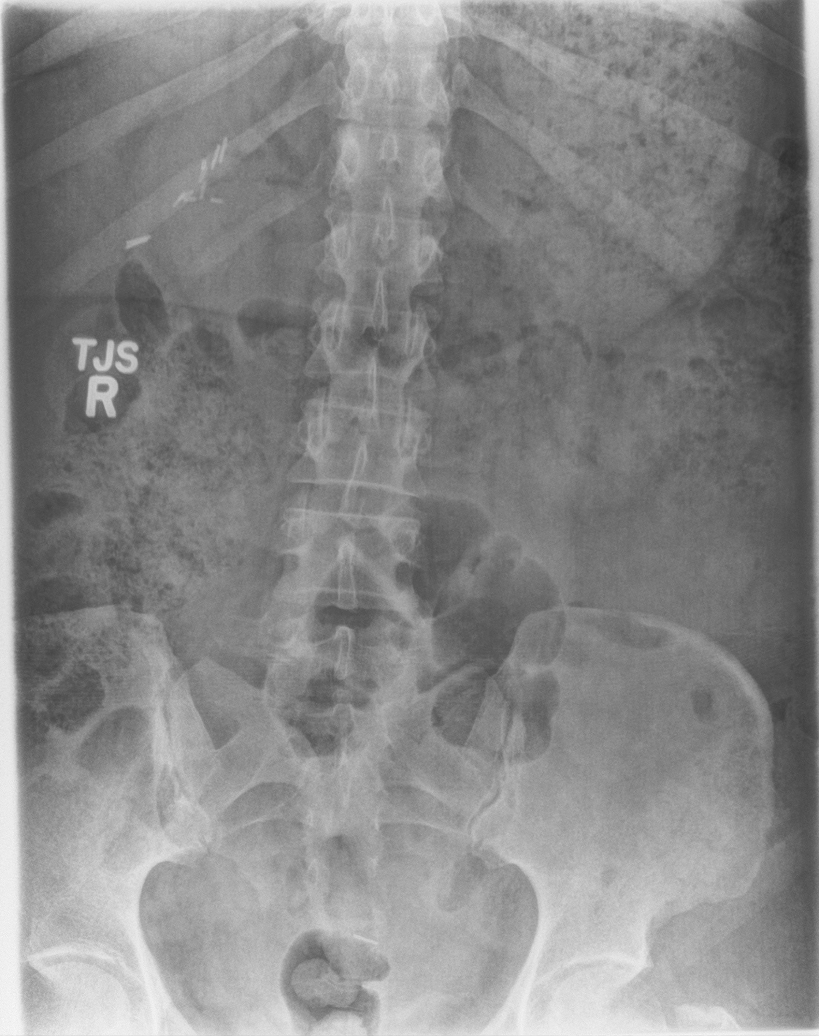
[im 2/5]
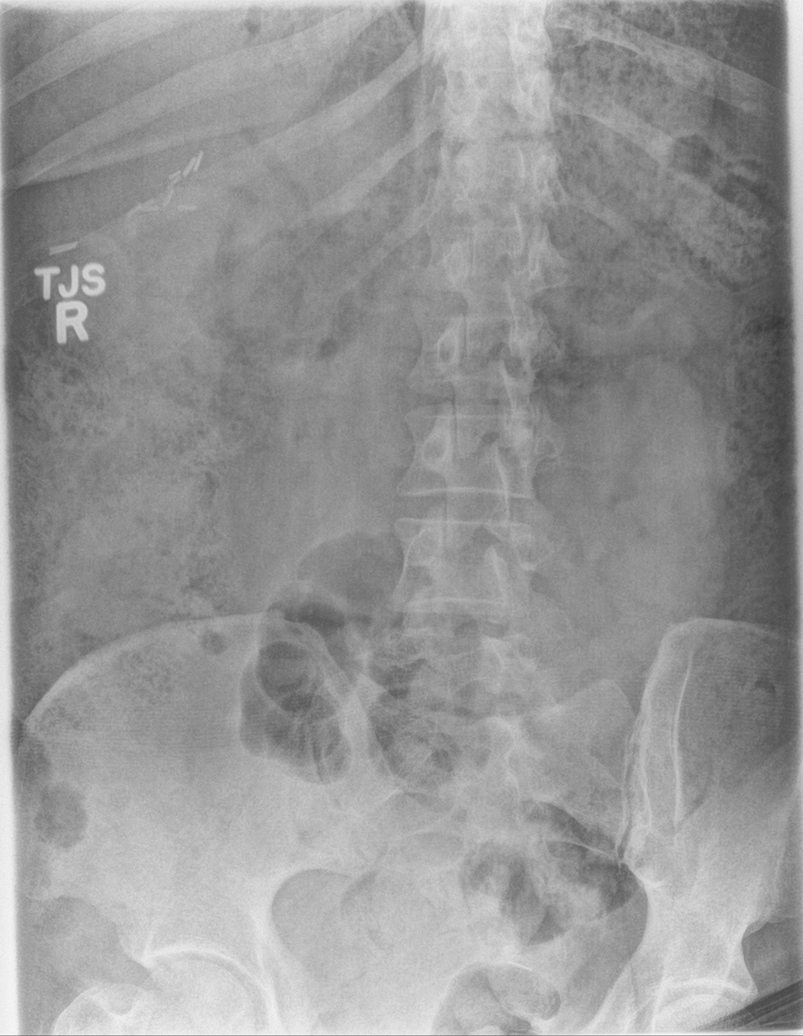
[im 3/5]
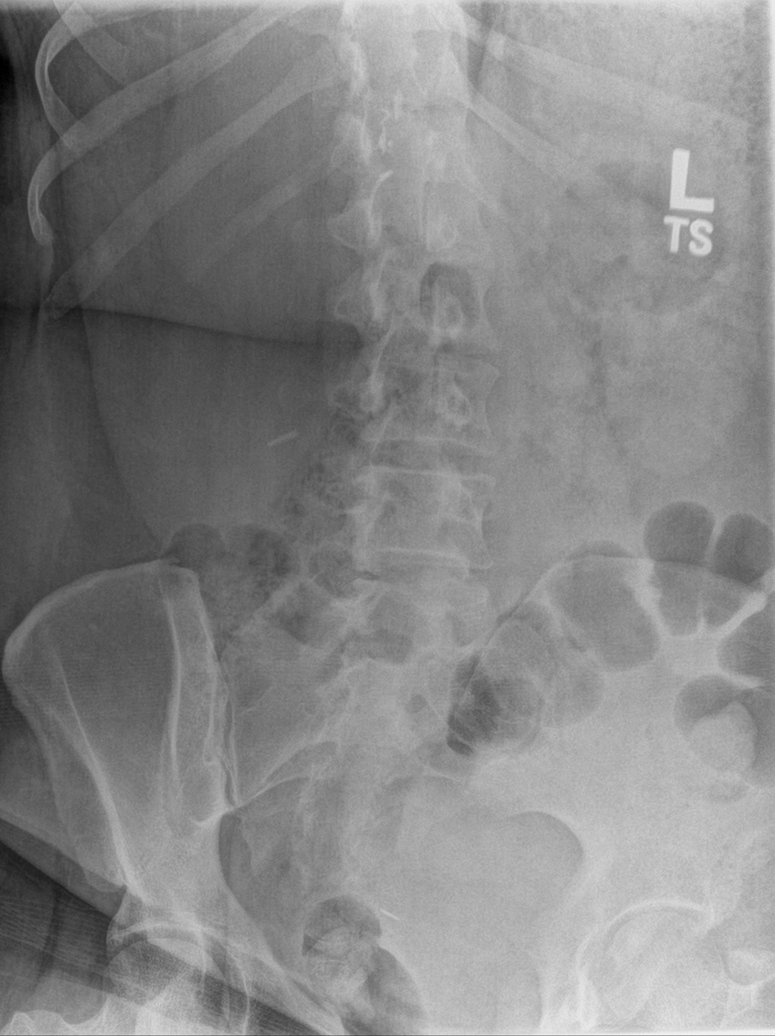
[im 4/5]
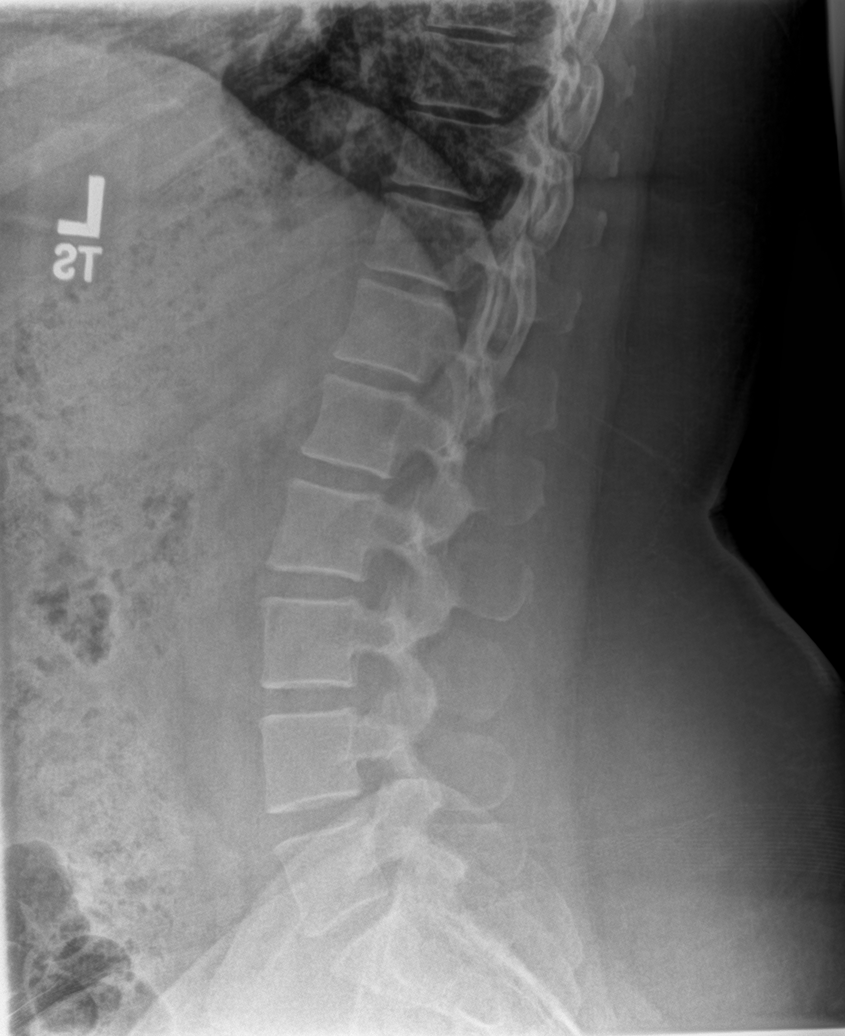
[im 5/5]
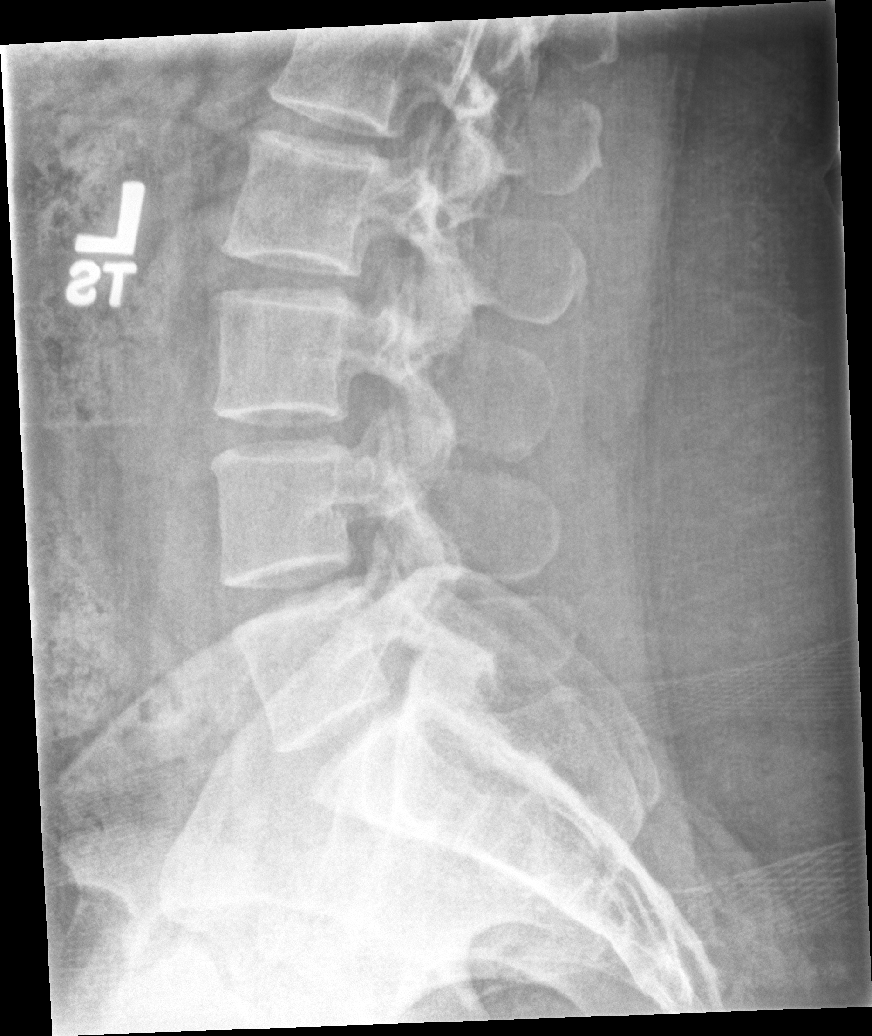

[5 of 5 positions shown; findings below may reference images not displayed]

FINDINGS: The lumbar vertebral bodies are preserved in height. Gentle
curvature of the thoracolumbar junction is noted and may be
positional. The pedicles and transverse processes are intact. The
disc space heights are well maintained. There is no
spondylolisthesis. No significant facet joint hypertrophy is
observed.
IMPRESSION: There is no acute or significant chronic bony abnormality of the
lumbar spine peer

## 2017-10-25 NOTE — Patient Instructions (Signed)
Go to the [facility] on [address] for back Xrays  

## 2017-10-26 ENCOUNTER — Telehealth: Payer: Self-pay

## 2017-10-26 DIAGNOSIS — M542 Cervicalgia: Secondary | ICD-10-CM

## 2017-10-26 NOTE — Telephone Encounter (Signed)
-----   Message from Birdie Sons, MD sent at 10/26/2017  9:59 AM EST ----- xrays show a lot of arthritis and some spurs on cervical spine of  Neck. Recommend trial of celecoxib 200mg  one tablet daily, #30, rf x 1. If this does make a big difference within a few weeks a would recommend referral to physiatry  (Dr. Sharlet Salina)

## 2017-10-26 NOTE — Telephone Encounter (Signed)
Advised patient as below. When trying to send in medication, a red flag came up due to patient being allergic to sulfa. Ok to send in Celebrex? Please advise. Thanks!

## 2017-10-26 NOTE — Telephone Encounter (Signed)
Can change to meloxicam 15mg  once a day, #30, rf x 1

## 2017-10-27 MED ORDER — MELOXICAM 15 MG PO TABS: 15.0000 mg | ORAL_TABLET | Freq: Every day | ORAL | 1 refills | Status: DC

## 2017-10-27 NOTE — Telephone Encounter (Signed)
Rx sent to pharmacy   

## 2017-11-01 ENCOUNTER — Telehealth: Payer: Self-pay | Admitting: Family Medicine

## 2017-11-01 DIAGNOSIS — S161XXS Strain of muscle, fascia and tendon at neck level, sequela: Secondary | ICD-10-CM

## 2017-11-01 MED ORDER — TIZANIDINE HCL 2 MG PO CAPS: ORAL_CAPSULE | ORAL | 2 refills | Status: DC

## 2017-11-01 MED ORDER — MELOXICAM 15 MG PO TABS: 15.0000 mg | ORAL_TABLET | Freq: Every day | ORAL | 2 refills | Status: DC

## 2017-11-01 NOTE — Telephone Encounter (Signed)
Please advise 

## 2017-11-01 NOTE — Telephone Encounter (Signed)
Advised patient as below.  

## 2017-11-01 NOTE — Telephone Encounter (Signed)
Yes, she can do that

## 2017-11-01 NOTE — Telephone Encounter (Signed)
Pt is asking if she can continue to take tizanidine (ZANAFLEX) 2 MG capsule at bedtime because she takes meloxicam (MOBIC) 15 MG tablet during the day. Please advise. Thanks TNP

## 2017-11-24 ENCOUNTER — Telehealth: Payer: Self-pay | Admitting: *Deleted

## 2017-11-24 NOTE — Telephone Encounter (Signed)
This message is concerning a form we received by fax for [facility]. Left message on pt's vm advising her to come to the office to sign a release of information form and specify information she wants [place] to send to [facility].

## 2017-11-29 NOTE — Telephone Encounter (Signed)
Patient had release form faxed to office.

## 2017-12-13 ENCOUNTER — Telehealth: Payer: BLUE CROSS/BLUE SHIELD | Admitting: Family

## 2017-12-13 DIAGNOSIS — H1032 Unspecified acute conjunctivitis, left eye: Secondary | ICD-10-CM

## 2017-12-13 MED ORDER — POLYMYXIN B-TRIMETHOPRIM 10000-0.1 UNIT/ML-% OP SOLN: 1.0000 [drp] | OPHTHALMIC | 0 refills | Status: DC

## 2017-12-13 NOTE — Progress Notes (Signed)

## 2018-02-11 ENCOUNTER — Other Ambulatory Visit: Payer: Self-pay | Admitting: Family Medicine

## 2018-02-14 ENCOUNTER — Encounter: Payer: Self-pay | Admitting: Family Medicine

## 2018-02-14 ENCOUNTER — Ambulatory Visit (INDEPENDENT_AMBULATORY_CARE_PROVIDER_SITE_OTHER): Payer: BLUE CROSS/BLUE SHIELD | Admitting: Family Medicine

## 2018-02-14 VITALS — BP 130/84 | HR 72 | Temp 98.1°F | Resp 16 | Ht 62.0 in | Wt 285.0 lb

## 2018-02-14 DIAGNOSIS — H539 Unspecified visual disturbance: Secondary | ICD-10-CM

## 2018-02-14 DIAGNOSIS — R519 Headache, unspecified: Secondary | ICD-10-CM

## 2018-02-14 DIAGNOSIS — G4733 Obstructive sleep apnea (adult) (pediatric): Secondary | ICD-10-CM

## 2018-02-14 DIAGNOSIS — R51 Headache: Secondary | ICD-10-CM | POA: Diagnosis not present

## 2018-02-14 DIAGNOSIS — Z23 Encounter for immunization: Secondary | ICD-10-CM

## 2018-02-14 NOTE — Progress Notes (Addendum)
Patient: Sandra Wilkins Female    DOB: [birth year]   52 y.o.   MRN: [id number] Visit Date: 02/14/2018  Today's Provider: Lelon Huh, MD   Chief Complaint  Patient presents with  . Headache   Subjective:    Patient states that for over a year she has been having occasions where her vision goes blurry. Patient states that most of the time when this occurs, she will get a headache in her temporal ara. Patient states when her vision changes, she will experiences dizziness, photophobia, and wavering images. Patient states episodes occurs around 2 times a month and last up to 5 minutes. Patient states that when she gets headaches afterwards, she has to lay down and rest.   Headache   This is a recurrent problem. The current episode started more than 1 year ago (1 1/2 year ago). The problem has been unchanged. The pain is located in the temporal region. The pain does not radiate. The quality of the pain is described as aching. Associated symptoms include blurred vision, dizziness, eye pain, photophobia and a visual change. Pertinent negatives include no abdominal pain, abnormal behavior, anorexia, back pain, coughing, drainage, ear pain, eye redness, eye watering, facial sweating, fever, hearing loss, insomnia, loss of balance, muscle aches, nausea, neck pain, numbness, phonophobia, rhinorrhea, scalp tenderness, seizures, sinus pressure, sore throat, swollen glands, tingling, tinnitus, vomiting, weakness or weight loss. The symptoms are aggravated by bright light. She has tried darkened room for the symptoms.  Reports multiple episodes feeling dizzy, flashes of light in eyes, and couldn't focus. states had episodes bottom half of TV went. last eye exam was about two years ago. requires bifocals.   She does have history of OSA and reports she is using CPAP consistently every night.     Allergies  Allergen Reactions  . Codeine Hives  . Fish Oil Other (See Comments)    Indigestion Indigestion   . Other Hives and Itching    Metronidazole or Ciprofloxacin    . Sulfa Antibiotics Hives and Itching  . Tramadol Other (See Comments)    Hallucinations  . Penicillins Rash    Has patient had a PCN reaction causing immediate rash, facial/tongue/throat swelling, SOB or lightheadedness with hypotension: Unknown Has patient had a PCN reaction causing severe rash involving mucus membranes or skin necrosis: Unknown Has patient had a PCN reaction that required hospitalization: Unknown Has patient had a PCN reaction occurring within the last 10 years: No If all of the above answers are "NO", then may proceed with Cephalosporin use.      Current Outpatient Medications:  .  ALPRAZolam (XANAX) 0.5 MG tablet, TAKE 1/2 TO 2 TABLETS BY MOUTH EVERY 4 HOURS AS NEEDED FOR PANIC ATTACKS, Disp: 30 tablet, Rfl: 5 .  Biotin 5 MG CAPS, Take 2 capsules by mouth daily., Disp: , Rfl:  .  cetirizine (ZYRTEC) 10 MG tablet, Take 10 mg by mouth every morning. NOT TAKING SINCE SHE HAS BEEN SICK WITH GALLBLADDER, Disp: , Rfl:  .  Ibuprofen 200 MG CAPS, Take 400 mg by mouth 2 (two) times daily as needed (PAIN). , Disp: , Rfl:  .  meloxicam (MOBIC) 15 MG tablet, Take 1 tablet (15 mg total) by mouth daily., Disp: 30 tablet, Rfl: 2 .  Multiple Vitamin (MULTIVITAMIN) tablet, Take 1 tablet by mouth daily., Disp: , Rfl:  .  omeprazole (PRILOSEC) 20 MG capsule, TAKE 1 CAPSULE (20 MG TOTAL) BY MOUTH TWICE DAILY, Disp: 60 capsule,  Rfl: 10 .  sertraline (ZOLOFT) 100 MG tablet, Take 1 tablet (100 mg total) by mouth 2 (two) times daily., Disp: 180 tablet, Rfl: 3 .  tizanidine (ZANAFLEX) 2 MG capsule, TAKE 1 CAPSULE BY MOUTH THREE TIMES A DAY AS NEEDED, Disp: 30 capsule, Rfl: 2 .  furosemide (LASIX) 20 MG tablet, Take 20 mg by mouth., Disp: , Rfl:  .  trimethoprim-polymyxin b (POLYTRIM) ophthalmic solution, Place 1 drop into the left eye every 4 (four) hours. (Patient not taking: Reported on 02/14/2018), Disp: 10 mL, Rfl: 0  Review  of Systems  Constitutional: Negative for appetite change, chills, fatigue, fever and weight loss.  HENT: Negative for ear pain, hearing loss, rhinorrhea, sinus pressure, sore throat and tinnitus.   Eyes: Positive for blurred vision, photophobia, pain and visual disturbance. Negative for redness.  Respiratory: Negative for cough, chest tightness and shortness of breath.   Cardiovascular: Negative for chest pain and palpitations.  Gastrointestinal: Negative for abdominal pain, anorexia, nausea and vomiting.  Musculoskeletal: Negative for back pain and neck pain.  Neurological: Positive for dizziness and headaches. Negative for tingling, seizures, weakness, numbness and loss of balance.  Psychiatric/Behavioral: The patient does not have insomnia.     Social History   Tobacco Use  . Smoking status: Former Smoker    Packs/day: 0.50    Years: 20.00    Pack years: 10.00    Types: Cigarettes    Last attempt to quit: 11/13/2005    Years since quitting: 12.2  . Smokeless tobacco: Never Used  Substance Use Topics  . Alcohol use: Yes    Comment: RARE   Objective:   BP 130/84 (BP Location: Right Arm, Patient Position: Sitting, Cuff Size: Large)   Pulse 72   Temp 98.1 F (36.7 C) (Oral)   Resp 16   Ht 5\' 2"  (1.575 m)   Wt 285 lb (129.3 kg)   LMP 09/23/2016   SpO2 94%   BMI 52.13 kg/m  Vitals:   02/14/18 0951  BP: 130/84  Pulse: 72  Resp: 16  Temp: 98.1 F (36.7 C)  TempSrc: Oral  SpO2: 94%  Weight: 285 lb (129.3 kg)  Height: 5\' 2"  (1.575 m)     Physical Exam  General Appearance:    Alert, cooperative, no distress, obese  Eyes:    PERRL, conjunctiva/corneas clear, EOM's intact       Lungs:     Clear to auscultation bilaterally, respirations unlabored  Heart:    Regular rate and rhythm  Neurologic:   Awake, alert, oriented x 3. No apparent focal neurological           defect.           Assessment & Plan:     1. Nonintractable episodic headache, unspecified headache  type Possible migraine, consider visual disturbances need to rule CNS structural abnormalities.  - CT HEAD W & WO CONTRAST; Future  2. Visual disturbances  - Ambulatory referral to Ophthalmology  3. Need for Tdap vaccination  - Tdap vaccine greater than or equal to 7yo IM - CT HEAD W & WO CONTRAST; Future  4. OSA (obstructive sleep apnea) Compliant with CPAP       Lelon Huh, MD  [facility]

## 2018-02-19 ENCOUNTER — Other Ambulatory Visit: Payer: Self-pay | Admitting: Family Medicine

## 2018-02-19 DIAGNOSIS — R609 Edema, unspecified: Secondary | ICD-10-CM

## 2018-02-23 ENCOUNTER — Ambulatory Visit
Admission: RE | Admit: 2018-02-23 | Discharge: 2018-02-23 | Disposition: A | Discharge status: Home / Self Care | Payer: BLUE CROSS/BLUE SHIELD | Source: Ambulatory Visit | Attending: Family Medicine | Admitting: Family Medicine

## 2018-02-23 DIAGNOSIS — R51 Headache: Secondary | ICD-10-CM | POA: Insufficient documentation

## 2018-02-23 DIAGNOSIS — R519 Headache, unspecified: Secondary | ICD-10-CM

## 2018-02-23 DIAGNOSIS — Z23 Encounter for immunization: Secondary | ICD-10-CM

## 2018-02-23 IMAGING — CT CT HEAD WO/W CM
3 of 5 series · 14 of 47 positions shown, 16 images · IV contrast (omnipaque)
Comparison: None.

CLINICAL DATA: Severe headaches and distorted vision. Flashes of
light.

EXAM:
CT HEAD WITHOUT AND WITH CONTRAST
TECHNIQUE: Contiguous axial images were obtained from the base of the skull
through the vertex without and with intravenous contrast
CONTRAST:  75mL OMNIPAQUE IOHEXOL 300 MG/ML  SOLN

[Series 2: brain · axial · 0.39mm/px · z∈[-583,-483]mm · 8 of 28 slices shown, 10 images (1 of 3)]
[im 4/28  brain]
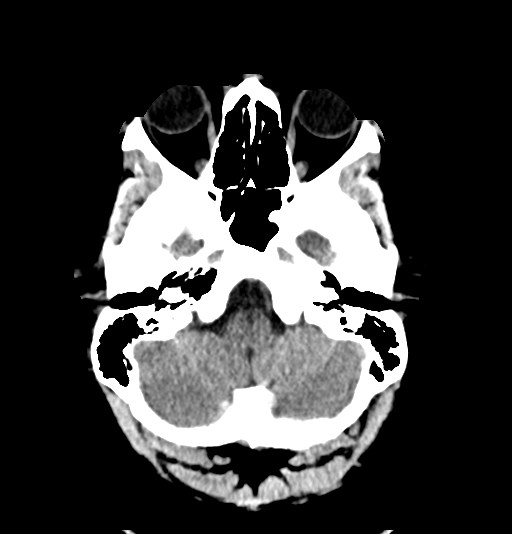
[im 4/28  bone]
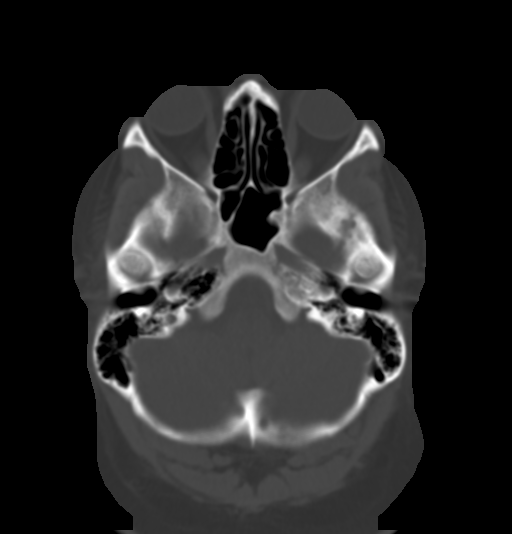
[im 7/28  brain]
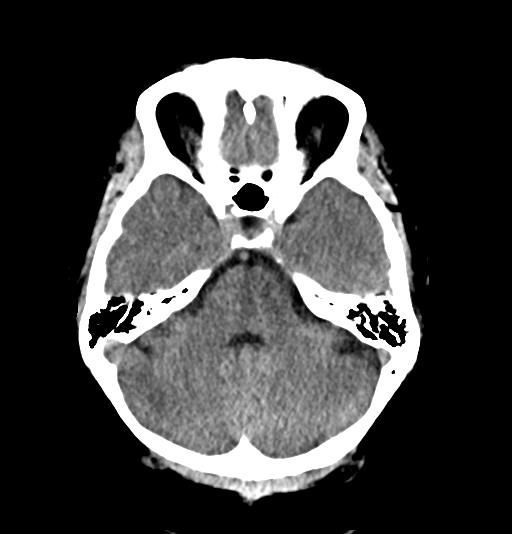
[im 10/28  brain]
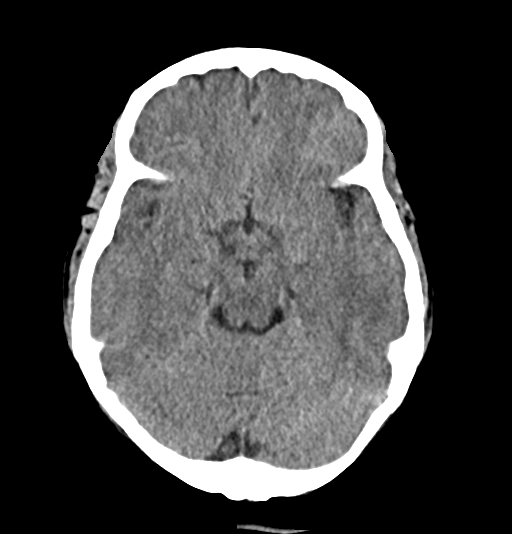
[im 13/28  brain]
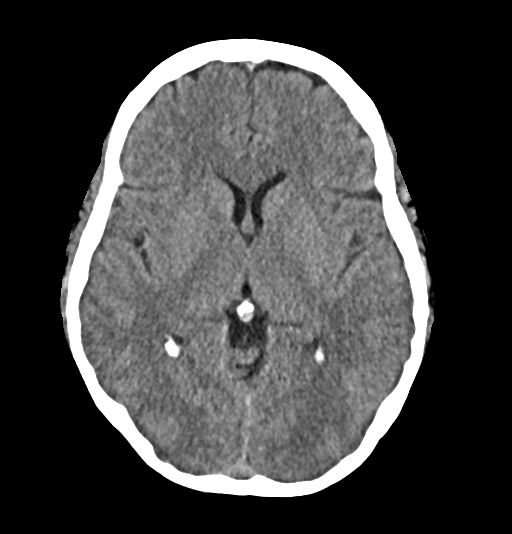
[im 16/28  brain]
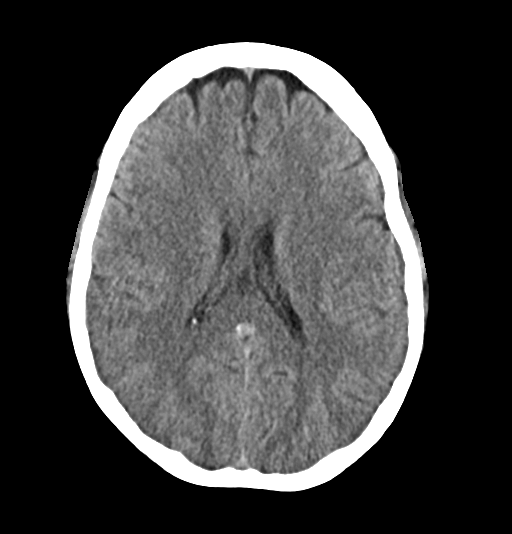
[im 16/28  bone]
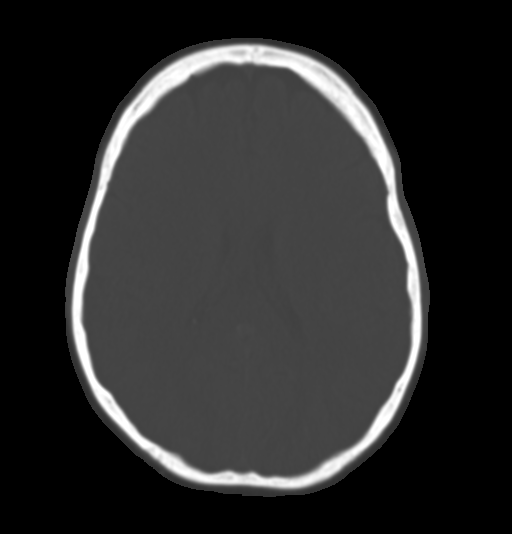
[im 19/28  brain]
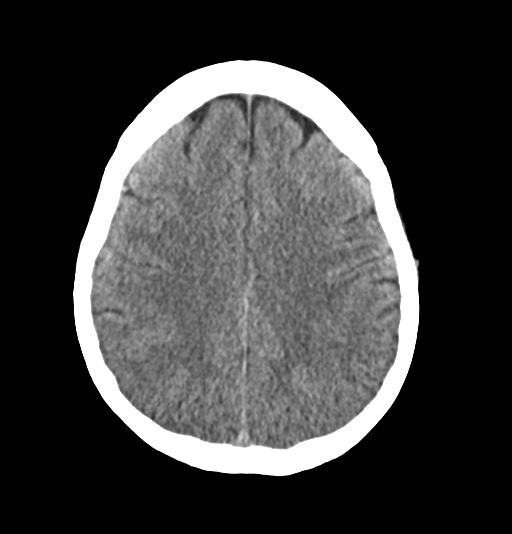
[im 22/28  brain]
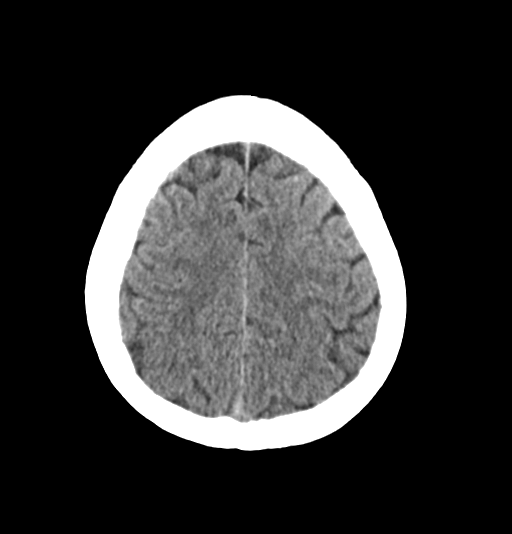
[im 25/28  brain]
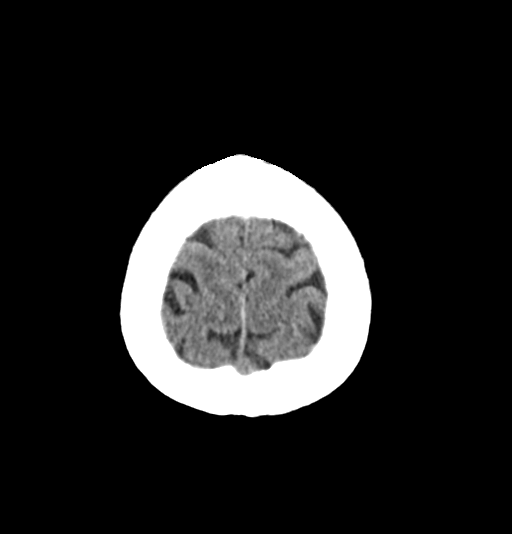

[Series 8: brain · coronal · 0.28mm/px · 3 of 69 slices shown (2 of 3)]
[im 23/69  brain]
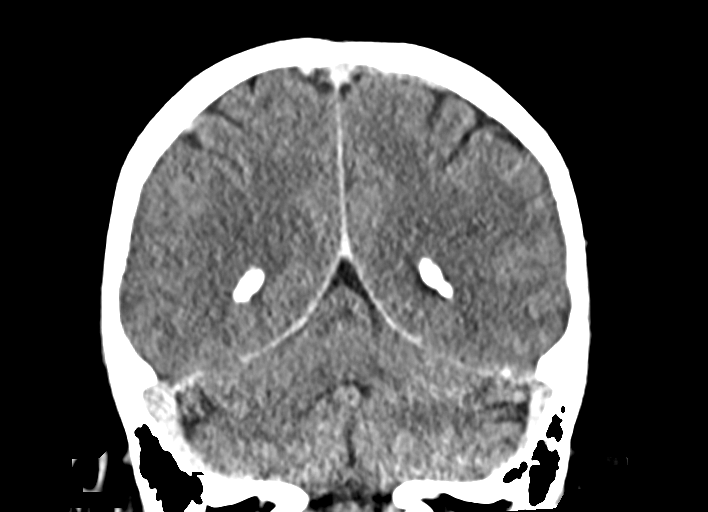
[im 31/69  brain]
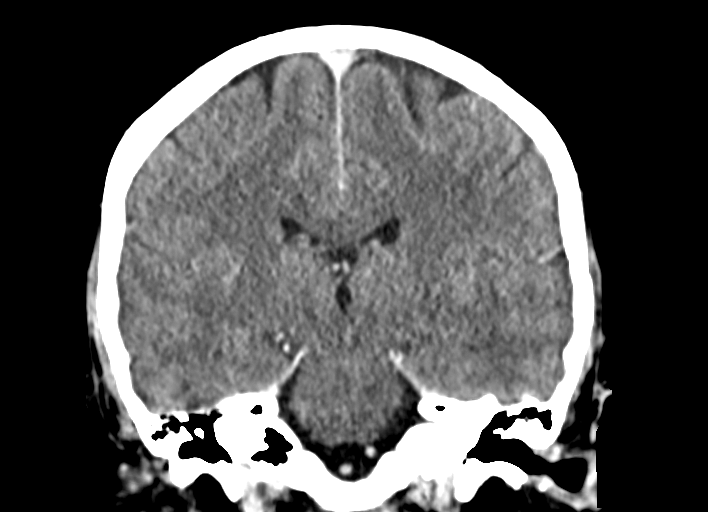
[im 38/69  brain]
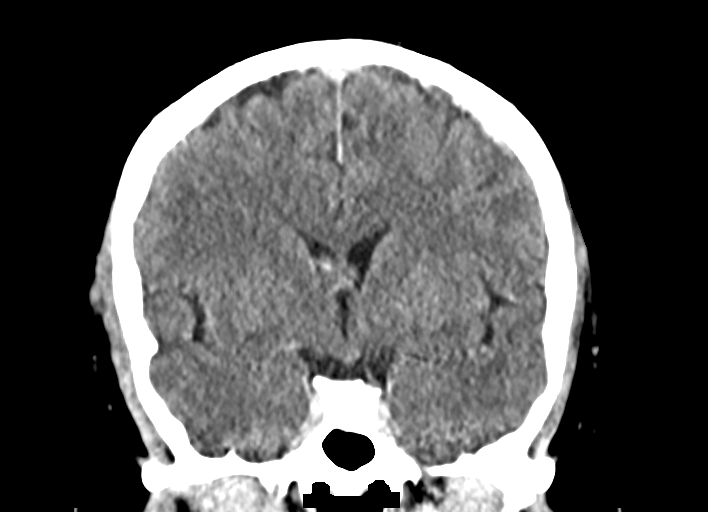

[Series 10: brain · sagittal · 0.28mm/px · 3 of 66 slices shown (3 of 3)]
[im 22/66  brain]
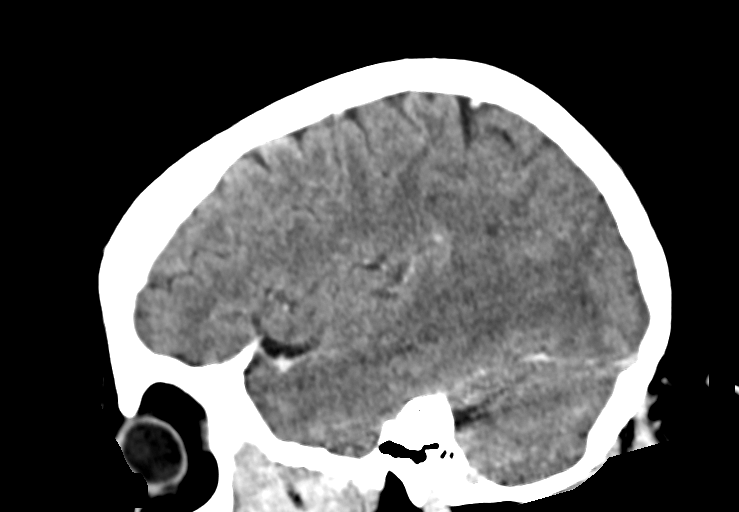
[im 33/66  brain]
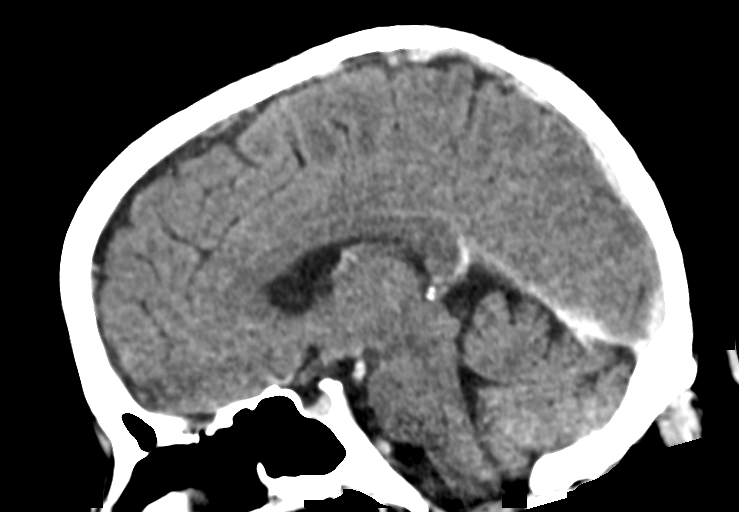
[im 44/66  brain]
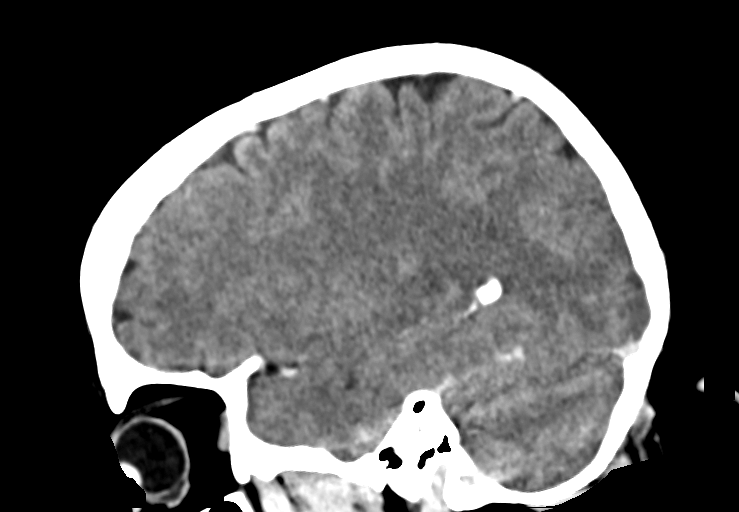

[14 of 47 positions shown; findings below may reference images not displayed]

FINDINGS: Brain: No evidence of acute infarction, hemorrhage, hydrocephalus,
extra-axial collection or mass lesion/mass effect. Normal cerebral
volume. No significant white matter hypoattenuation. Post infusion,
no abnormal enhancement of the brain or meninges.

Vascular: No hyperdense vessel or unexpected calcification. Visible
vessels are patent.

Skull: Normal. Negative for fracture or focal lesion.

Sinuses/Orbits: No acute finding. Probable chronic sinus disease
suspect prior surgery.

Other: None.
IMPRESSION: Negative exam.

## 2018-02-23 MED ORDER — IOHEXOL 300 MG/ML  SOLN
75.0000 mL | Freq: Once | INTRAMUSCULAR | Status: AC | PRN
  Administered 2018-02-23: 75 mL via INTRAVENOUS

## 2018-02-26 ENCOUNTER — Telehealth: Payer: Self-pay | Admitting: Emergency Medicine

## 2018-02-26 NOTE — Telephone Encounter (Signed)
Pt informed. Pt wanted to wait on the medication until she got her eyes checked as she has an appt to get them checked. She will call back if she wants the rx sent in after her eye exam.

## 2018-02-26 NOTE — Telephone Encounter (Signed)
-----   Message from Birdie Sons, MD sent at 02/26/2018  8:04 AM EDT ----- Ct is normal. Is probably having migraines. Recommend trial of amitriptyline 10mg  tablets, one at bedtime for 7 days, then increase to 2 at bedtime, #60, rf x 0. Follow up o.v 2-3 weeks.

## 2018-03-01 ENCOUNTER — Telehealth: Payer: Self-pay | Admitting: Family Medicine

## 2018-03-01 DIAGNOSIS — R519 Headache, unspecified: Secondary | ICD-10-CM

## 2018-03-01 DIAGNOSIS — R51 Headache: Principal | ICD-10-CM

## 2018-03-01 MED ORDER — AMITRIPTYLINE HCL 10 MG PO TABS: ORAL_TABLET | ORAL | 1 refills | Status: DC

## 2018-03-01 NOTE — Telephone Encounter (Signed)
Pt stated that she spoke with [place] on 02/26/18 and pt had wanted to hold off on trying the Amitriptyline 10 mg for her headaches. Pt stated she has decided she would like to try the medication.   Amitriptyline 10mg  tablets, one at bedtime for 7 days, then increase to 2 at bedtime, #60, rf x 0. Was in the message from 02/26/18. Pt would like it sent to [facility]. Please advise. Thanks TNP

## 2018-03-01 NOTE — Telephone Encounter (Signed)
Please advise 

## 2018-03-23 ENCOUNTER — Other Ambulatory Visit: Payer: Self-pay | Admitting: Family Medicine

## 2018-03-23 DIAGNOSIS — F410 Panic disorder [episodic paroxysmal anxiety] without agoraphobia: Secondary | ICD-10-CM

## 2018-03-24 ENCOUNTER — Other Ambulatory Visit: Payer: Self-pay | Admitting: Family Medicine

## 2018-03-24 DIAGNOSIS — R51 Headache: Principal | ICD-10-CM

## 2018-03-24 DIAGNOSIS — R519 Headache, unspecified: Secondary | ICD-10-CM

## 2018-04-04 ENCOUNTER — Ambulatory Visit: Payer: Self-pay | Admitting: Family Medicine

## 2018-04-11 ENCOUNTER — Ambulatory Visit (INDEPENDENT_AMBULATORY_CARE_PROVIDER_SITE_OTHER): Payer: BLUE CROSS/BLUE SHIELD | Admitting: Family Medicine

## 2018-04-11 ENCOUNTER — Encounter: Payer: Self-pay | Admitting: Family Medicine

## 2018-04-11 VITALS — BP 140/84 | HR 83 | Temp 98.0°F | Resp 16 | Wt 286.0 lb

## 2018-04-11 DIAGNOSIS — M722 Plantar fascial fibromatosis: Secondary | ICD-10-CM | POA: Diagnosis not present

## 2018-04-11 DIAGNOSIS — R600 Localized edema: Secondary | ICD-10-CM

## 2018-04-11 DIAGNOSIS — R197 Diarrhea, unspecified: Secondary | ICD-10-CM

## 2018-04-11 DIAGNOSIS — K760 Fatty (change of) liver, not elsewhere classified: Secondary | ICD-10-CM

## 2018-04-11 MED ORDER — CHOLESTYRAMINE LIGHT 4 G PO PACK: 4.0000 g | PACK | Freq: Three times a day (TID) | ORAL | 11 refills | Status: DC

## 2018-04-11 NOTE — Assessment & Plan Note (Signed)
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

## 2018-04-11 NOTE — Assessment & Plan Note (Signed)
Discussed with patient that she does not need to take "liver supplements " Prior to cholelithiasis, she did have minor elevation of her ALT that was consistent with Sandra Wilkins Since having her gallbladder removed, her LFTs have improved We will recheck today Discussed importance of low-fat diet and regular exercise

## 2018-04-11 NOTE — Assessment & Plan Note (Signed)
Exam of right foot is consistent with plantar fasciitis Patient also with tight calves and Achilles tendons Discussed importance of stretching Home exercise program given Try cold water bottle and tennis ball under foot

## 2018-04-11 NOTE — Progress Notes (Signed)
Patient: Sandra Wilkins Female    DOB: 01-25-1966   52 y.o.   MRN: [id number] Visit Date: 04/11/2018  Kathleen Lime Ratchford, CMA, am acting as scribe for Lavon Paganini, MD.  [phone] Provider: Lavon Paganini, MD   Chief Complaint  Patient presents with  . Weight Gain  . Edema   Subjective:    HPI     Obesity Pt states she had a cholecystectomy about 1 year ago, and she states she has gained over 100 pounds since the surgery (Chart review shows weight gain of about 40lbs). She states she can not exercise due to ankle and knee pain. She states she is counting calories, and is not eating more than 1500 calories per day. She states is eating plenty of vegetables (though later , no sodas, is only drinking water and unsweet tea. She does eat pasta with chicken, and is concerned that she may be eating too many carbs.  When she was worked up for her gallbladder, she was found to have NASH. Her Alk Phos and ALT were elevated, and has remained elevated. She is taking supplements that support the liver, including Standard Process "[place]" and "Livaplex".  Edema Pt is c/o bilateral ankle swelling (right worse than left ), right knee swelling. This has been present for several years, and is worsening. It is intermittent.  She states there is pain associated with the swelling, and the pain is worse in the mornings and with activity. Pt saw Dr. Caryn Section for this, and he R/O cardiac involvement with normal BNP.  She is taking Lasix daily for it and it does not seem to help.  She is not adding salt to her food, but not watching sodium content.  She has never tried compression stockings.  Bottoms of her feet (R>L) hurt when she first puts them on the floor in the morning or after sitting for long periods of time.    Allergies  Allergen Reactions  . Codeine Hives  . Fish Oil Other (See Comments)    Indigestion Indigestion  . Other Hives and Itching    Metronidazole or Ciprofloxacin    .  Sulfa Antibiotics Hives and Itching  . Tramadol Other (See Comments)    Hallucinations  . Penicillins Rash    Has patient had a PCN reaction causing immediate rash, facial/tongue/throat swelling, SOB or lightheadedness with hypotension: Unknown Has patient had a PCN reaction causing severe rash involving mucus membranes or skin necrosis: Unknown Has patient had a PCN reaction that required hospitalization: Unknown Has patient had a PCN reaction occurring within the last 10 years: No If all of the above answers are "NO", then may proceed with Cephalosporin use.      Current Outpatient Medications:  .  ALPRAZolam (XANAX) 0.5 MG tablet, TAKE 1/2 TO 2 TABLETS BY MOUTH EVERY 4 HOURS IF NEEDED FOR PANIC ATTACKS, Disp: 30 tablet, Rfl: 5 .  amitriptyline (ELAVIL) 10 MG tablet, TAKE 1 TABLET AT BEDTIME EVERY DAY FOR 7 DAYS, THEN INCREASE TO 2 TABS AT BEDTIME, Disp: 180 tablet, Rfl: 3 .  Biotin 5 MG CAPS, Take 2 capsules by mouth daily., Disp: , Rfl:  .  cetirizine (ZYRTEC) 10 MG tablet, Take 10 mg by mouth every morning. NOT TAKING SINCE SHE HAS BEEN SICK WITH GALLBLADDER, Disp: , Rfl:  .  furosemide (LASIX) 20 MG tablet, Take 20 mg by mouth., Disp: , Rfl:  .  furosemide (LASIX) 20 MG tablet, TAKE 1 TABLET (20  MG TOTAL) BY MOUTH DAILY., Disp: 30 tablet, Rfl: 5 .  Ibuprofen 200 MG CAPS, Take 400 mg by mouth 2 (two) times daily as needed (PAIN). , Disp: , Rfl:  .  meloxicam (MOBIC) 15 MG tablet, Take 1 tablet (15 mg total) by mouth daily., Disp: 30 tablet, Rfl: 2 .  Multiple Vitamin (MULTIVITAMIN) tablet, Take 1 tablet by mouth daily., Disp: , Rfl:  .  omeprazole (PRILOSEC) 20 MG capsule, TAKE 1 CAPSULE (20 MG TOTAL) BY MOUTH TWICE DAILY, Disp: 60 capsule, Rfl: 10 .  sertraline (ZOLOFT) 100 MG tablet, Take 1 tablet (100 mg total) by mouth 2 (two) times daily., Disp: 180 tablet, Rfl: 3 .  tizanidine (ZANAFLEX) 2 MG capsule, TAKE 1 CAPSULE BY MOUTH THREE TIMES A DAY AS NEEDED, Disp: 30 capsule, Rfl:  2  Review of Systems  Social History   Tobacco Use  . Smoking status: Former Smoker    Packs/day: 0.50    Years: 20.00    Pack years: 10.00    Types: Cigarettes    Last attempt to quit: 11/13/2005    Years since quitting: 12.4  . Smokeless tobacco: Never Used  Substance Use Topics  . Alcohol use: Yes    Comment: RARE   Objective:   LMP 09/23/2016  There were no vitals filed for this visit.   Physical Exam  Constitutional: She is oriented to person, place, and time. She appears well-developed and well-nourished. No distress.  HENT:  Head: Normocephalic and atraumatic.  Eyes: Conjunctivae are normal. Right eye exhibits no discharge. Left eye exhibits no discharge. No scleral icterus.  Neck: Neck supple. No thyromegaly present.  Cardiovascular: Normal rate, regular rhythm, normal heart sounds and intact distal pulses.  No murmur heard. Pulmonary/Chest: Effort normal and breath sounds normal. No respiratory distress. She has no wheezes. She has no rales.  Musculoskeletal: She exhibits edema (trace bilaterally). She exhibits no deformity.  TTP along plantar fascia of R foot. Tight Achilles tendons b/l.  Lymphadenopathy:    She has no cervical adenopathy.  Neurological: She is alert and oriented to person, place, and time.  Skin: Skin is warm and dry. Capillary refill takes less than 2 seconds. No rash noted.  Psychiatric: She has a normal mood and affect. Her behavior is normal.  Vitals reviewed.      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)    Discussed with patient that she does not need to take "liver supplements " Prior to cholelithiasis, she did have minor elevation of her ALT that was consistent with Karlene Lineman Since having her gallbladder removed, her LFTs have improved We will recheck today Discussed importance of low-fat diet and regular exercise       Relevant Orders   Comprehensive metabolic panel     Musculoskeletal and Integument   Plantar fasciitis    Exam of right foot is consistent with plantar fasciitis Patient also with tight calves and Achilles tendons Discussed importance of stretching Home exercise program given Try cold water bottle and tennis ball under foot        Other   Lower extremity edema - Primary    History of normal uric acid and BNP No significant swelling noted today As this is worsening through the day, suspect venous insufficiency related to obesity Discussed importance of regular exercise, weight loss, compression socks Can continue Lasix, but may  be able to stop this in the future if it is not helping No evidence of DVT      Diarrhea    Seems to have post-chole diarrhea Recommend low fat diet and lifestyle modifications Trial of cholestyramine TID with meals.          The entirety of the information documented in the History of Present Illness, Review of Systems and Physical Exam were personally obtained by me. Portions of this information were initially documented by Raquel Sarna Ratchford, CMA and reviewed by me for thoroughness and accuracy.    Virginia Crews, MD, MPH [facility] 04/11/2018 10:15 AM

## 2018-04-11 NOTE — Assessment & Plan Note (Signed)
History of normal uric acid and BNP No significant swelling noted today As this is worsening through the day, suspect venous insufficiency related to obesity Discussed importance of regular exercise, weight loss, compression socks Can continue Lasix, but may be able to stop this in the future if it is not helping No evidence of DVT

## 2018-04-12 LAB — COMPREHENSIVE METABOLIC PANEL
ALBUMIN: 4.4 g/dL (ref 3.5–5.5)
ALT: 54 IU/L — AB (ref 0–32)
AST: 29 IU/L (ref 0–40)
Albumin/Globulin Ratio: 1.6 (ref 1.2–2.2)
Alkaline Phosphatase: 125 IU/L — ABNORMAL HIGH (ref 39–117)
BUN / CREAT RATIO: 17 (ref 9–23)
BUN: 17 mg/dL (ref 6–24)
Bilirubin Total: 0.5 mg/dL (ref 0.0–1.2)
CALCIUM: 9.1 mg/dL (ref 8.7–10.2)
CO2: 22 mmol/L (ref 20–29)
CREATININE: 1 mg/dL (ref 0.57–1.00)
Chloride: 101 mmol/L (ref 96–106)
GFR, EST AFRICAN AMERICAN: 76 mL/min/{1.73_m2} (ref >59)
GFR, EST NON AFRICAN AMERICAN: 66 mL/min/{1.73_m2} (ref >59)
GLUCOSE: 103 mg/dL — AB (ref 65–99)
Globulin, Total: 2.7 g/dL (ref 1.5–4.5)
Potassium: 3.9 mmol/L (ref 3.5–5.2)
Sodium: 140 mmol/L (ref 134–144)
TOTAL PROTEIN: 7.1 g/dL (ref 6.0–8.5)

## 2018-04-21 ENCOUNTER — Encounter: Payer: Self-pay | Admitting: Family Medicine

## 2018-05-14 DIAGNOSIS — G43109 Migraine with aura, not intractable, without status migrainosus: Secondary | ICD-10-CM | POA: Insufficient documentation

## 2018-07-17 ENCOUNTER — Encounter: Payer: Self-pay | Admitting: Family Medicine

## 2018-07-17 DIAGNOSIS — Z1211 Encounter for screening for malignant neoplasm of colon: Secondary | ICD-10-CM

## 2018-07-20 ENCOUNTER — Other Ambulatory Visit: Payer: Self-pay | Admitting: Family Medicine

## 2018-07-22 ENCOUNTER — Encounter: Payer: Self-pay | Admitting: Family Medicine

## 2018-07-23 MED ORDER — SERTRALINE HCL 100 MG PO TABS: 100.0000 mg | ORAL_TABLET | Freq: Two times a day (BID) | ORAL | 4 refills | Status: DC

## 2018-07-25 ENCOUNTER — Encounter: Payer: Self-pay | Admitting: Certified Nurse Midwife

## 2018-08-01 ENCOUNTER — Encounter: Payer: Self-pay | Admitting: Certified Nurse Midwife

## 2018-08-01 ENCOUNTER — Other Ambulatory Visit (HOSPITAL_COMMUNITY)
Admission: RE | Admit: 2018-08-01 | Discharge: 2018-08-01 | Disposition: A | Discharge status: Home / Self Care | Payer: BLUE CROSS/BLUE SHIELD | Source: Ambulatory Visit | Attending: Certified Nurse Midwife | Admitting: Certified Nurse Midwife

## 2018-08-01 ENCOUNTER — Ambulatory Visit (INDEPENDENT_AMBULATORY_CARE_PROVIDER_SITE_OTHER): Payer: BLUE CROSS/BLUE SHIELD | Admitting: Certified Nurse Midwife

## 2018-08-01 VITALS — BP 113/73 | HR 80 | Ht 62.0 in | Wt 282.2 lb

## 2018-08-01 DIAGNOSIS — D219 Benign neoplasm of connective and other soft tissue, unspecified: Secondary | ICD-10-CM

## 2018-08-01 DIAGNOSIS — Z1239 Encounter for other screening for malignant neoplasm of breast: Secondary | ICD-10-CM

## 2018-08-01 DIAGNOSIS — Z01419 Encounter for gynecological examination (general) (routine) without abnormal findings: Secondary | ICD-10-CM | POA: Diagnosis not present

## 2018-08-01 DIAGNOSIS — Z1231 Encounter for screening mammogram for malignant neoplasm of breast: Secondary | ICD-10-CM

## 2018-08-01 NOTE — Patient Instructions (Signed)

## 2018-08-01 NOTE — Progress Notes (Signed)
GYNECOLOGY ANNUAL PREVENTATIVE CARE ENCOUNTER NOTE  Subjective:   Sandra Wilkins is a 52 y.o. G0P0000 female here for a routine annual gynecologic exam.  Current complaints: postmenopausal bleeding has increased. U/S completed 10/16/17 showed fibroid and endometrium measuring 19.1. Pt state she was unable to follow up due to husband having some health issues.   Denies discharge, pelvic pain, problems with intercourse or other gynecologic concerns.    Gynecologic History No LMP recorded. (Menstrual status: Perimenopausal). Contraception: none Last Pap: 10/14/2015. Results were: normal with negative HPV Last mammogram: 2016  Results were: normal  Obstetric History OB History  Gravida Para Term Preterm AB Living  0 0 0 0 0 0  SAB TAB Ectopic Multiple Live Births  0 0 0 0 0  Obstetric Comments  1st Menstrual Cycle:  12       Past Medical History:  Diagnosis Date  . Anxiety   . Arthritis    DDD-NECK  . Depression   . GERD (gastroesophageal reflux disease)   . Headache    MIGRAINES  . Hypertension    H/O WAS ON FUROSEMIDE IN THE PAST BUT LOST WEIGHT AND WAS TAKEN OFF DUE TO BP CONTROL  . Pre-diabetes   . Sleep apnea    CPAP    Past Surgical History:  Procedure Laterality Date  . BREAST BIOPSY Right 2012   benign  . CHOLECYSTECTOMY  04/17/2017   Procedure: CHOLECYSTECTOMY;  Surgeon: Robert Bellow, MD;  Location: [facility] ORS;  Service: General;;  Laparoscopic converted to open  . ERCP N/A 04/13/2017   Procedure: ENDOSCOPIC RETROGRADE CHOLANGIOPANCREATOGRAPHY (ERCP);  Surgeon: Lucilla Lame, MD;  Location: [facility] ENDOSCOPY;  Service: Endoscopy;  Laterality: N/A;  . ERCP N/A 06/20/2017   Procedure: ENDOSCOPIC RETROGRADE CHOLANGIOPANCREATOGRAPHY (ERCP) STENT REMOVAL;  Surgeon: Lucilla Lame, MD;  Location: [facility] ENDOSCOPY;  Service: Endoscopy;  Laterality: N/A;  . TONSILLECTOMY AND ADENOIDECTOMY  1972    Current Outpatient Medications on File Prior to Visit  Medication Sig Dispense  Refill  . ALPRAZolam (XANAX) 0.5 MG tablet TAKE 1/2 TO 2 TABLETS BY MOUTH EVERY 4 HOURS IF NEEDED FOR PANIC ATTACKS 30 tablet 5  . amitriptyline (ELAVIL) 10 MG tablet TAKE 1 TABLET AT BEDTIME EVERY DAY FOR 7 DAYS, THEN INCREASE TO 2 TABS AT BEDTIME 180 tablet 3  . Biotin 5 MG CAPS Take 2 capsules by mouth daily.    . cetirizine (ZYRTEC) 10 MG tablet Take 10 mg by mouth every morning. NOT TAKING SINCE SHE HAS BEEN SICK WITH GALLBLADDER    . cholestyramine light (PREVALITE) 4 g packet Take 1 packet (4 g total) by mouth 3 (three) times daily. 90 packet 11  . furosemide (LASIX) 20 MG tablet Take 20 mg by mouth.    . Ibuprofen 200 MG CAPS Take 400 mg by mouth 2 (two) times daily as needed (PAIN).     Marland Kitchen meloxicam (MOBIC) 15 MG tablet Take 1 tablet (15 mg total) by mouth daily. 30 tablet 2  . Multiple Vitamin (MULTIVITAMIN) tablet Take 1 tablet by mouth daily.    Marland Kitchen omeprazole (PRILOSEC) 20 MG capsule TAKE 1 CAPSULE (20 MG TOTAL) BY MOUTH TWICE DAILY 60 capsule 10  . OVER THE COUNTER MEDICATION Standard Process Green [employer] supplements for liver health    . sertraline (ZOLOFT) 100 MG tablet Take 1 tablet (100 mg total) by mouth 2 (two) times daily. 180 tablet 4  . tizanidine (ZANAFLEX) 2 MG capsule TAKE 1 CAPSULE BY MOUTH THREE TIMES A DAY AS  NEEDED 30 capsule 2   No current facility-administered medications on file prior to visit.     Allergies  Allergen Reactions  . Codeine Hives  . Fish Oil Other (See Comments)    Indigestion Indigestion  . Other Hives and Itching    Metronidazole or Ciprofloxacin    . Sulfa Antibiotics Hives and Itching  . Tramadol Other (See Comments)    Hallucinations  . Penicillins Rash    Has patient had a PCN reaction causing immediate rash, facial/tongue/throat swelling, SOB or lightheadedness with hypotension: Unknown Has patient had a PCN reaction causing severe rash involving mucus membranes or skin necrosis: Unknown Has patient had a PCN reaction that required  hospitalization: Unknown Has patient had a PCN reaction occurring within the last 10 years: No If all of the above answers are "NO", then may proceed with Cephalosporin use.     Social History:  reports that she quit smoking about 12 years ago. Her smoking use included cigarettes. She has a 10.00 pack-year smoking history. She has never used smokeless tobacco. She reports that she drinks alcohol. She reports that she does not use drugs.  Family History  Problem Relation Age of Onset  . Hyperlipidemia Father   . Depression Sister   . Bipolar disorder Brother   . Seizures Brother   . Congestive Heart Failure Maternal Grandfather   . Dementia Paternal Grandfather   . Depression Sister   . Depression Mother   . Arthritis Mother   . Emphysema Mother   . Congestive Heart Failure Mother   . Arthritis Maternal Grandmother   . Asthma Maternal Grandmother     The following portions of the patient's history were reviewed and updated as appropriate: allergies, current medications, past family history, past medical history, past social history, past surgical history and problem list.  Review of Systems Pertinent items noted in HPI and remainder of comprehensive ROS otherwise negative.   Objective:  BP 113/73   Pulse 80   Ht 5\' 2"  (1.575 m)   Wt 282 lb 3 oz (128 kg)   BMI 51.61 kg/m  CONSTITUTIONAL: Well-developed, well-nourished, morbid obese female in no acute distress.  HENT:  Normocephalic, atraumatic, External right and left ear normal. Oropharynx is clear and moist EYES: Conjunctivae and EOM are normal. Pupils are equal, round, and reactive to light. No scleral icterus.  NECK: Normal range of motion, supple, no masses.  Normal thyroid.  SKIN: Skin is warm and dry. No rash noted. Not diaphoretic. No erythema. No pallor. MUSCULOSKELETAL: Normal range of motion. No tenderness.  No cyanosis, clubbing, or edema.  2+ distal pulses. NEUROLOGIC: Alert and oriented to person, place, and  time. Normal reflexes, muscle tone coordination. No cranial nerve deficit noted. PSYCHIATRIC: Normal mood and affect. Normal behavior. Normal judgment and thought content. CARDIOVASCULAR: Normal heart rate noted, regular rhythm RESPIRATORY: Clear to auscultation bilaterally. Effort and breath sounds normal, no problems with respiration noted. BREASTS: Symmetric in size. No masses, skin changes, nipple drainage, or lymphadenopathy. Large pendulous   ABDOMEN: Soft, normal bowel sounds, no distention noted.  No tenderness, rebound or guarding. Exam compromised due to body habitus PELVIC: Normal appearing external genitalia; pale appearing vaginal mucosa and cervix.  No abnormal discharge noted.  Pap smear obtained. Bleeding present with pap and endometrial biopsy.   Normal uterine size, no other palpable masses, no uterine or adnexal tenderness. Exam compromised to due body habitus.     Assessment and Plan:  Annual well women exam  Will  follow up results of pap smear and manage accordingly. Mammogram scheduled Pt has cologaurd at home from PCP Lipid profile today Routine preventative health maintenance measures emphasized. Encourage exercise and weight loss.  Please refer to After Visit Summary for other counseling recommendations.   Philip Aspen, CNM

## 2018-08-02 LAB — LIPID PANEL
Chol/HDL Ratio: 4.6 ratio — ABNORMAL HIGH (ref 0.0–4.4)
Cholesterol, Total: 181 mg/dL (ref 100–199)
HDL: 39 mg/dL — ABNORMAL LOW (ref >39)
LDL Calculated: 103 mg/dL — ABNORMAL HIGH (ref 0–99)
Triglycerides: 195 mg/dL — ABNORMAL HIGH (ref 0–149)
VLDL Cholesterol Cal: 39 mg/dL (ref 5–40)

## 2018-08-02 NOTE — Addendum Note (Signed)
Addended by: Elouise Munroe on: 08/02/2018 02:05 PM   Modules accepted: Orders

## 2018-08-07 LAB — CYTOLOGY - PAP
Diagnosis: NEGATIVE
HPV: NOT DETECTED

## 2018-08-15 ENCOUNTER — Other Ambulatory Visit: Payer: Self-pay

## 2018-08-20 ENCOUNTER — Telehealth: Payer: Self-pay | Admitting: Family Medicine

## 2018-08-20 NOTE — Telephone Encounter (Signed)
Pt I having issues with the CPAP company (Doesn't know the name). Supposed to get machine approved by sending Fr. Fisher, but has been without for 2 weeks. Pt is nodding off while driving, working and not sleeping well.   Pt is asking for help to get her machine.  Fax and telephone - [phone] Fax [phone]  Please advise.  Thanks, [employer]

## 2018-08-21 NOTE — Addendum Note (Signed)
Addended by: Raliegh Ip on: 08/21/2018 03:20 PM   Modules accepted: Orders

## 2018-08-22 ENCOUNTER — Other Ambulatory Visit: Payer: Self-pay

## 2018-08-22 NOTE — Telephone Encounter (Signed)
Spoke with the company.  They need a prescription for a new Cpap machine with the setting faxed to the number below.   Thanks,   -Mickel Baas

## 2018-08-22 NOTE — Telephone Encounter (Signed)
I don't know what she needs. Please call [facility] and find out what the problem is. Thanks.

## 2018-08-22 NOTE — Telephone Encounter (Signed)
I wrote order for CPAP which can be faxed. But I can't find any documentation about what settings she is using. May need to ask patient or see if [facility] has any documentation.

## 2018-08-23 ENCOUNTER — Encounter: Payer: Self-pay | Admitting: Family Medicine

## 2018-08-28 NOTE — Telephone Encounter (Signed)
Sharyn Lull with [facility] stated that the last CPAP setting they had for pt was 10/2015 for auto setting ranging from 5-20. Please advise. Thanks TNP

## 2018-08-28 NOTE — Telephone Encounter (Signed)
I tried calling [facility] at [phone] and left a detailed message on Michelle's voice message system Sharyn Lull is over [place] with [facility]). I called patient and updated her on this message. Patient turned on her CPAP machine and read the settings, which she reports is 15.6 with water climate of 86.

## 2018-08-31 ENCOUNTER — Encounter: Payer: Self-pay | Admitting: Family Medicine

## 2018-08-31 NOTE — Telephone Encounter (Signed)
Order written, please fax

## 2018-09-03 ENCOUNTER — Telehealth: Payer: Self-pay | Admitting: Family Medicine

## 2018-09-03 DIAGNOSIS — G4733 Obstructive sleep apnea (adult) (pediatric): Secondary | ICD-10-CM

## 2018-09-03 NOTE — Telephone Encounter (Signed)
Patient called asking if we had received her sleep study from [facility].   Her study is in Dr. Sabino Snipes message box up front.   Patient is falling asleep at stops lights and at work and is very concerned about this.   She would like to get a machine ASAP but needs Dr. Caryn Section to look at her study.

## 2018-09-03 NOTE — Telephone Encounter (Signed)
Order faxed. Please check with [facility] to make sure they received order.

## 2018-09-03 NOTE — Telephone Encounter (Signed)
Have received copy of study and order for new machine, please see not on order.

## 2018-09-04 NOTE — Telephone Encounter (Signed)
Order faxed to [facility] yesterday and  [place] today.

## 2018-09-05 ENCOUNTER — Ambulatory Visit (INDEPENDENT_AMBULATORY_CARE_PROVIDER_SITE_OTHER): Payer: BLUE CROSS/BLUE SHIELD

## 2018-09-05 DIAGNOSIS — D219 Benign neoplasm of connective and other soft tissue, unspecified: Secondary | ICD-10-CM

## 2018-09-10 ENCOUNTER — Encounter: Payer: Self-pay | Admitting: Family Medicine

## 2018-09-25 ENCOUNTER — Other Ambulatory Visit: Payer: Self-pay | Admitting: Family Medicine

## 2018-09-25 DIAGNOSIS — R51 Headache: Principal | ICD-10-CM

## 2018-09-25 DIAGNOSIS — R519 Headache, unspecified: Secondary | ICD-10-CM

## 2018-10-17 ENCOUNTER — Ambulatory Visit (INDEPENDENT_AMBULATORY_CARE_PROVIDER_SITE_OTHER): Payer: BLUE CROSS/BLUE SHIELD | Admitting: Obstetrics and Gynecology

## 2018-10-17 ENCOUNTER — Encounter: Payer: Self-pay | Admitting: Obstetrics and Gynecology

## 2018-10-17 VITALS — BP 133/84 | HR 85 | Ht 62.0 in | Wt 280.1 lb

## 2018-10-17 DIAGNOSIS — Z23 Encounter for immunization: Secondary | ICD-10-CM | POA: Diagnosis not present

## 2018-10-17 DIAGNOSIS — N938 Other specified abnormal uterine and vaginal bleeding: Secondary | ICD-10-CM | POA: Diagnosis not present

## 2018-10-17 NOTE — Progress Notes (Signed)
Patient here for consult to discuss D&C.

## 2018-10-17 NOTE — Progress Notes (Signed)
HPI:      [name] is a 52 y.o. G0P0000 who LMP was No LMP recorded. (Menstrual status: Perimenopausal).  Subjective:   She presents today because she has been having issues with bleeding.  She states that she bleeds several times per month usually 1 or 2 days.  She says these are not like normal periods.  She was tested more than 1 year ago and she says her [facility] was "borderline".  She recently had an ultrasound showing a thickened endometrial lining.  In addition the endometrial biopsy showed only endocervical polyp.  Patient says the process of endometrial biopsy was difficult for her.    Hx: The following portions of the patient's history were reviewed and updated as appropriate:             She  has a past medical history of Anxiety, Arthritis, Depression, GERD (gastroesophageal reflux disease), Headache, Hypertension, Pre-diabetes, and Sleep apnea. She does not have any pertinent problems on file. She  has a past surgical history that includes Tonsillectomy and adenoidectomy (1972); ERCP (N/A, 04/13/2017); Cholecystectomy (04/17/2017); Breast biopsy (Right, 2012); and ERCP (N/A, 06/20/2017). Her family history includes Arthritis in her maternal grandmother and mother; Asthma in her maternal grandmother; Bipolar disorder in her brother; Congestive Heart Failure in her maternal grandfather and mother; Dementia in her paternal grandfather; Depression in her mother, sister, and sister; Emphysema in her mother; Hyperlipidemia in her father; Seizures in her brother. She  reports that she quit smoking about 12 years ago. Her smoking use included cigarettes. She has a 10.00 pack-year smoking history. She has never used smokeless tobacco. She reports that she drinks alcohol. She reports that she does not use drugs. She has a current medication list which includes the following prescription(s): alprazolam, amitriptyline, biotin, cetirizine, furosemide, ibuprofen, meloxicam, multivitamin, omeprazole,  sertraline, and tizanidine. She is allergic to codeine; other; sulfa antibiotics; tramadol; and penicillins.       Review of Systems:  Review of Systems  Constitutional: Denied constitutional symptoms, night sweats, recent illness, fatigue, fever, insomnia and weight loss.  Eyes: Denied eye symptoms, eye pain, photophobia, vision change and visual disturbance.  Ears/Nose/Throat/Neck: Denied ear, nose, throat or neck symptoms, hearing loss, nasal discharge, sinus congestion and sore throat.  Cardiovascular: Denied cardiovascular symptoms, arrhythmia, chest pain/pressure, edema, exercise intolerance, orthopnea and palpitations.  Respiratory: Denied pulmonary symptoms, asthma, pleuritic pain, productive sputum, cough, dyspnea and wheezing.  Gastrointestinal: Denied, gastro-esophageal reflux, melena, nausea and vomiting.  Genitourinary: See HPI for additional information.  Musculoskeletal: Denied musculoskeletal symptoms, stiffness, swelling, muscle weakness and myalgia.  Dermatologic: Denied dermatology symptoms, rash and scar.  Neurologic: Denied neurology symptoms, dizziness, headache, neck pain and syncope.  Psychiatric: Denied psychiatric symptoms, anxiety and depression.  Endocrine: Denied endocrine symptoms including hot flashes and night sweats.   Meds:   Current Outpatient Medications on File Prior to Visit  Medication Sig Dispense Refill  . ALPRAZolam (XANAX) 0.5 MG tablet TAKE 1/2 TO 2 TABLETS BY MOUTH EVERY 4 HOURS IF NEEDED FOR PANIC ATTACKS 30 tablet 5  . amitriptyline (ELAVIL) 10 MG tablet TAKE 1 TABLET BY MOUTH AT BEDTIME FOR 7 DAYS THEN INCREASE TO 2 TABS AT BEDTIME 180 tablet 2  . Biotin 5 MG CAPS Take 2 capsules by mouth daily.    . cetirizine (ZYRTEC) 10 MG tablet Take 10 mg by mouth every morning. NOT TAKING SINCE SHE HAS BEEN SICK WITH GALLBLADDER    . furosemide (LASIX) 20 MG tablet Take 20 mg  by mouth.    . Ibuprofen 200 MG CAPS Take 400 mg by mouth 2 (two) times daily  as needed (PAIN).     Marland Kitchen meloxicam (MOBIC) 15 MG tablet Take 1 tablet (15 mg total) by mouth daily. (Patient taking differently: Take 15 mg by mouth as needed. ) 30 tablet 2  . Multiple Vitamin (MULTIVITAMIN) tablet Take 1 tablet by mouth daily.    Marland Kitchen omeprazole (PRILOSEC) 20 MG capsule TAKE 1 CAPSULE (20 MG TOTAL) BY MOUTH TWICE DAILY 60 capsule 10  . sertraline (ZOLOFT) 100 MG tablet Take 1 tablet (100 mg total) by mouth 2 (two) times daily. 180 tablet 4  . tizanidine (ZANAFLEX) 2 MG capsule TAKE 1 CAPSULE BY MOUTH THREE TIMES A DAY AS NEEDED 30 capsule 2   No current facility-administered medications on file prior to visit.     Objective:     Vitals:   10/17/18 1113  BP: 133/84  Pulse: 85              Physical examination   Pelvic:   Vulva: Normal appearance.  No lesions.  Vagina: No lesions or abnormalities noted.  Support: Normal pelvic support.  Urethra No masses tenderness or scarring.  Meatus Normal size without lesions or prolapse.  Cervix: Normal appearance.  No lesions.  Anterior cervix very difficult to visualize-long speculum.  Anus: Normal exam.  No lesions.  Perineum: Normal exam.  No lesions.        Bimanual   Uterus: Normal size.  Non-tender.  Mobile.  AV.  Adnexae: No masses.  Non-tender to palpation.  Cul-de-sac: Negative for abnormality.   Exam limited by patient body habitus.  Assessment:    G0P0000 Patient Active Problem List   Diagnosis Date Noted  . Ocular migraine 05/14/2018  . Plantar fasciitis 04/11/2018  . Diarrhea 04/11/2018  . DDD (degenerative disc disease), cervical 08/22/2016  . Hyperuricemia 07/23/2016  . Arthralgia 07/22/2016  . Irritable bowel syndrome (IBS) 10/21/2015  . 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 Irregular menses 05/21/2015  . Arthritis 04/16/2015  . Clinical depression 04/16/2015  . Acid reflux 04/16/2015  . Lower extremity edema 04/16/2015  . Pre-diabetes  04/16/2015  . OSA (obstructive sleep apnea) 04/16/2015  . Anxiety disorder 09/05/2006     1. Dysfunctional uterine bleeding     No evidence of endocervical polyps at this time.  Possible thickened endometrium which depends on her status as pre or postmenopausal.  Need for adequate endometrial sampling to be determined by pre-or postmenopausal status.  Uterine fibroids possibly causing bleeding although if menopausal these should be shrinking and thus her bleeding should be improving.   Plan:            1.  [place]  2.  Patient declined endometrial biopsy today.  If [place] elevated she understands she will need adequate endometrial sampling. Orders Orders Placed This Encounter  Procedures  . Follicle stimulating hormone    No orders of the defined types were placed in this encounter.     F/U  Return in about 2 weeks (around 10/31/2018) for We will contact her with any abnormal test results. I spent 32 minutes involved in the care of this patient of which greater than 50% was spent discussing thickened endometrium, pre-and postmenopausal status, endocervical polyps versus endometrial polyps, endometrial sampling etc.  All questions answered.  Finis Bud, M.D. 10/17/2018 11:52 AM

## 2018-10-18 LAB — FOLLICLE STIMULATING HORMONE: FSH: 36.8 m[IU]/mL

## 2018-10-28 ENCOUNTER — Other Ambulatory Visit: Payer: Self-pay | Admitting: Family Medicine

## 2018-10-28 DIAGNOSIS — F410 Panic disorder [episodic paroxysmal anxiety] without agoraphobia: Secondary | ICD-10-CM

## 2018-11-20 ENCOUNTER — Telehealth: Payer: Self-pay

## 2018-11-20 NOTE — Telephone Encounter (Signed)
Patient stated she does not know if she is having anxiety or heartburns. She stated the following symptoms: chest pressure and upper back pain that last for 2 day last week.Patient denies any symptoms at this time. I schedule patient appointment for tomorrow 11/21/2018 @ 8:20 a.m. with Dr.B.

## 2018-11-21 ENCOUNTER — Encounter: Payer: Self-pay | Admitting: Family Medicine

## 2018-11-21 ENCOUNTER — Ambulatory Visit (INDEPENDENT_AMBULATORY_CARE_PROVIDER_SITE_OTHER): Payer: BLUE CROSS/BLUE SHIELD | Admitting: Family Medicine

## 2018-11-21 VITALS — BP 137/90 | HR 80 | Temp 98.2°F | Wt 283.8 lb

## 2018-11-21 DIAGNOSIS — R079 Chest pain, unspecified: Secondary | ICD-10-CM | POA: Diagnosis not present

## 2018-11-21 DIAGNOSIS — K219 Gastro-esophageal reflux disease without esophagitis: Secondary | ICD-10-CM | POA: Diagnosis not present

## 2018-11-21 NOTE — Progress Notes (Signed)
Patient: Sandra Wilkins Female    DOB: Apr 03, 1966   53 y.o.   MRN: [id number] Visit Date: 11/21/2018  Today's Provider: Lavon Paganini, MD   Chief Complaint  Patient presents with  . Chest Pain  . Palpitations   Subjective:     Chest Pain   This is a new problem. Episode onset: Thursday. The onset quality is sudden. Episode frequency: episodes. The problem has been gradually improving. The pain is present in the substernal region. The quality of the pain is described as dull. The pain radiates to the upper back. Associated symptoms include back pain, nausea and palpitations. Pertinent negatives include no abdominal pain or vomiting. The pain is aggravated by nothing. She has tried nothing for the symptoms. Risk factors include lack of exercise, obesity, post-menopausal, sedentary lifestyle and stress.  Her past medical history is significant for anxiety/panic attacks and sleep apnea. Family history comments: mother has CHF and COPD, Family history of Anxiety, Mother and brother have sleep apnea   Substernal dull chest pain radiating to central back started ~6 days ago and lasted for ~2 days.  Associated with some palpitations and nausea.  No vomiting, SOB.  Has intermittent hot flashes at baseline. LE edema at baseline.  Symptoms have all resolved. Was not feeling anxious with episode.  No known exacerbating or alleviating factors, but did rest when possible.  Has GERD, takes PPI, may have eaten chili before this started.     Allergies  Allergen Reactions  . Codeine Hives  . Other Hives and Itching    Metronidazole or Ciprofloxacin    . Sulfa Antibiotics Hives and Itching  . Tramadol Other (See Comments)    Hallucinations  . Penicillins Rash    Has patient had a PCN reaction causing immediate rash, facial/tongue/throat swelling, SOB or lightheadedness with hypotension: Unknown Has patient had a PCN reaction causing severe rash involving mucus membranes or skin necrosis:  Unknown Has patient had a PCN reaction that required hospitalization: Unknown Has patient had a PCN reaction occurring within the last 10 years: No If all of the above answers are "NO", then may proceed with Cephalosporin use.      Current Outpatient Medications:  .  ALPRAZolam (XANAX) 0.5 MG tablet, TAKE 1/2 TO 2 TABLETS BY MOUTH EVERY 4 HOURS IF NEEDED FOR PANIC ATTACKS, Disp: 30 tablet, Rfl: 1 .  amitriptyline (ELAVIL) 10 MG tablet, TAKE 1 TABLET BY MOUTH AT BEDTIME FOR 7 DAYS THEN INCREASE TO 2 TABS AT BEDTIME, Disp: 180 tablet, Rfl: 2 .  Biotin 5 MG CAPS, Take 2 capsules by mouth daily., Disp: , Rfl:  .  cetirizine (ZYRTEC) 10 MG tablet, Take 10 mg by mouth every morning. NOT TAKING SINCE SHE HAS BEEN SICK WITH GALLBLADDER, Disp: , Rfl:  .  furosemide (LASIX) 20 MG tablet, Take 20 mg by mouth., Disp: , Rfl:  .  Ibuprofen 200 MG CAPS, Take 400 mg by mouth 2 (two) times daily as needed (PAIN). , Disp: , Rfl:  .  meloxicam (MOBIC) 15 MG tablet, Take 1 tablet (15 mg total) by mouth daily. (Patient taking differently: Take 15 mg by mouth as needed. ), Disp: 30 tablet, Rfl: 2 .  Multiple Vitamin (MULTIVITAMIN) tablet, Take 1 tablet by mouth daily., Disp: , Rfl:  .  omeprazole (PRILOSEC) 20 MG capsule, TAKE 1 CAPSULE (20 MG TOTAL) BY MOUTH TWICE DAILY, Disp: 60 capsule, Rfl: 10 .  sertraline (ZOLOFT) 100 MG tablet, Take 1 tablet (  100 mg total) by mouth 2 (two) times daily., Disp: 180 tablet, Rfl: 4 .  tizanidine (ZANAFLEX) 2 MG capsule, TAKE 1 CAPSULE BY MOUTH THREE TIMES A DAY AS NEEDED, Disp: 30 capsule, Rfl: 2  Review of Systems  Constitutional: Negative.   HENT: Positive for congestion. Negative for hearing loss, postnasal drip, rhinorrhea, sinus pressure, sinus pain and sore throat.   Respiratory: Negative.   Cardiovascular: Positive for chest pain, palpitations and leg swelling.  Gastrointestinal: Positive for nausea. Negative for abdominal distention, abdominal pain, constipation,  diarrhea and vomiting.  Genitourinary: Negative.   Musculoskeletal: Positive for back pain. Negative for arthralgias, gait problem and myalgias.  Skin: Negative.   Neurological: Negative.   Psychiatric/Behavioral: Negative.     Social History   Tobacco Use  . Smoking status: Former Smoker    Packs/day: 0.50    Years: 20.00    Pack years: 10.00    Types: Cigarettes    Last attempt to quit: 11/13/2005    Years since quitting: 13.0  . Smokeless tobacco: Never Used  Substance Use Topics  . Alcohol use: Yes    Comment: occasional       Objective:   BP 137/90 (BP Location: Right Arm, Patient Position: Sitting, Cuff Size: Large)   Pulse 80   Temp 98.2 F (36.8 C) (Oral)   Wt 283 lb 12.8 oz (128.7 kg)   SpO2 96%   BMI 51.91 kg/m  Vitals:   11/21/18 0819  BP: 137/90  Pulse: 80  Temp: 98.2 F (36.8 C)  TempSrc: Oral  SpO2: 96%  Weight: 283 lb 12.8 oz (128.7 kg)     Physical Exam Vitals signs reviewed.  Constitutional:      General: She is not in acute distress.    Appearance: She is well-developed. She is obese.  HENT:     Head: Normocephalic and atraumatic.  Eyes:     Pupils: Pupils are equal, round, and reactive to light.  Neck:     Musculoskeletal: Neck supple.     Thyroid: No thyromegaly.  Cardiovascular:     Rate and Rhythm: Normal rate and regular rhythm.  No extrasystoles are present.    Heart sounds: Normal heart sounds. No murmur.  Pulmonary:     Effort: Pulmonary effort is normal. No tachypnea or respiratory distress.     Breath sounds: Normal breath sounds.  Chest:     Chest wall: No deformity or tenderness.  Abdominal:     Palpations: Abdomen is soft.     Tenderness: There is no abdominal tenderness. There is no guarding.  Lymphadenopathy:     Cervical: No cervical adenopathy.  Skin:    General: Skin is warm and dry.     Capillary Refill: Capillary refill takes less than 2 seconds.     Findings: No rash.  Neurological:     General: No  focal deficit present.     Mental Status: She is alert and oriented to person, place, and time.  Psychiatric:        Mood and Affect: Mood normal.        Behavior: Behavior normal.     EKG: Normal sinus rhythm with no signs of ischemia    Assessment & Plan   1. Chest pain, unspecified type -New problem - Duration and description seems that it is certainly not cardiac in etiology -EKG is reassuring - Discussed if palpitations recur, we could consider cardiology referral for possible Holter monitor - likely related to GERD - discussed  diet, continue PPI, can use Maalox or Tums - see below - return precautions discussed - EKG 12-Lead  2. Gastroesophageal reflux disease, esophagitis presence not specified -Chronic problem -Continue PPI -Likely related to her chest pain -Discussed as needed use of Maalox and Tums - Return precautions discussed   Return if symptoms worsen or fail to improve.   The entirety of the information documented in the History of Present Illness, Review of Systems and Physical Exam were personally obtained by me. Portions of this information were initially documented by Tiburcio Pea, CMA and reviewed by me for thoroughness and accuracy.    Virginia Crews, MD, MPH [facility] 11/21/2018 9:11 AM

## 2018-11-21 NOTE — Patient Instructions (Signed)
Heartburn Heartburn is a type of pain or discomfort that can happen in the throat or chest. It is often described as a burning pain. It may also cause a bad, acid-like taste in the mouth. Heartburn may feel worse when you lie down or bend over, and it is often worse at night. Heartburn may be caused by stomach contents that move back up into the esophagus (reflux). Follow these instructions at home: Eating and drinking   Avoid certain foods and drinks as told by your health care provider. This may include: ? Coffee and tea (with or without caffeine). ? Drinks that contain alcohol. ? Energy drinks and sports drinks. ? Carbonated drinks or sodas. ? Chocolate and cocoa. ? Peppermint and mint flavorings. ? Garlic and onions. ? Horseradish. ? Spicy and acidic foods, including peppers, chili powder, curry powder, vinegar, hot sauces, and barbecue sauce. ? Citrus fruit juices and citrus fruits, such as oranges, lemons, and limes. ? Tomato-based foods, such as red sauce, chili, salsa, and pizza with red sauce. ? Fried and fatty foods, such as donuts, french fries, potato chips, and high-fat dressings. ? High-fat meats, such as hot dogs and fatty cuts of red and white meats, such as rib eye steak, sausage, ham, and bacon. ? High-fat dairy items, such as whole milk, butter, and cream cheese.  Eat small, frequent meals instead of large meals.  Avoid drinking large amounts of liquid with your meals.  Avoid eating meals during the 2-3 hours before bedtime.  Avoid lying down right after you eat.  Do not exercise right after you eat. Lifestyle      If you are overweight, reduce your weight to an amount that is healthy for you. Ask your health care provider for guidance about a safe weight loss goal.  Do not use any products that contain nicotine or tobacco, such as cigarettes, e-cigarettes, and chewing tobacco. These can make your symptoms worse. If you need help quitting, ask your health care  provider.  Wear loose-fitting clothing. Do not wear anything tight around your waist that causes pressure on your abdomen.  Raise (elevate) the head of your bed about 6 inches (15 cm) when you sleep.  Try to reduce your stress, such as with yoga or meditation. If you need help reducing stress, ask your health care provider. General instructions  Pay attention to any changes in your symptoms.  Take over-the-counter and prescription medicines only as told by your health care provider. ? Do not take aspirin, ibuprofen, or other NSAIDs unless your health care provider told you to do so. ? Stop medicines only as told by your health care provider. If you stop taking some medicines too quickly, your symptoms may get worse.  Keep all follow-up visits as told by your health care provider. This is important. Contact a health care provider if:  You have new symptoms.  You have unexplained weight loss.  You have difficulty swallowing, or it hurts to swallow.  You have wheezing or a persistent cough.  Your symptoms do not improve with treatment.  You have frequent heartburn for more than 2 weeks. Get help right away if:  You have pain in your arms, neck, jaw, teeth, or back.  You feel sweaty, dizzy, or light-headed.  You have chest pain or shortness of breath.  You vomit and your vomit looks like blood or coffee grounds.  Your stool is bloody or black. These symptoms may represent a serious problem that is an emergency. Do not  symptoms may represent a serious problem that is an emergency. Do not wait to see if the symptoms will go away. Get medical help right away. Call your local emergency services (911 in the [place] Do not drive yourself to the hospital.  Summary  Heartburn is a type of pain or discomfort that can happen in the throat or chest. It is often described as a burning pain. It may also cause a bad, acid-like taste in the mouth.  Avoid certain foods and drinks as told by your health care provider.  Take over-the-counter and prescription medicines only as told by your health care provider. Do not take aspirin, ibuprofen, or  other NSAIDs unless your health care provider told you to do so.  Contact a health care provider if your symptoms do not improve or they get worse.  This information is not intended to replace advice given to you by your health care provider. Make sure you discuss any questions you have with your health care provider.  Document Released: 03/19/2009 Document Revised: 04/02/2018 Document Reviewed: 04/02/2018  Elsevier Interactive Patient Education  2019 [employer]

## 2018-11-23 ENCOUNTER — Encounter: Payer: Self-pay | Admitting: Podiatry

## 2018-11-27 NOTE — Progress Notes (Signed)
This encounter was created in error - please disregard.

## 2018-11-29 ENCOUNTER — Ambulatory Visit: Payer: BLUE CROSS/BLUE SHIELD | Admitting: Family Medicine

## 2018-12-15 ENCOUNTER — Other Ambulatory Visit: Payer: Self-pay | Admitting: Family Medicine

## 2019-01-08 ENCOUNTER — Other Ambulatory Visit: Payer: Self-pay | Admitting: Family Medicine

## 2019-01-08 DIAGNOSIS — F410 Panic disorder [episodic paroxysmal anxiety] without agoraphobia: Secondary | ICD-10-CM

## 2019-03-17 ENCOUNTER — Telehealth: Payer: BLUE CROSS/BLUE SHIELD | Admitting: Family

## 2019-03-17 DIAGNOSIS — R6889 Other general symptoms and signs: Secondary | ICD-10-CM | POA: Diagnosis not present

## 2019-03-17 DIAGNOSIS — Z20822 Contact with and (suspected) exposure to covid-19: Secondary | ICD-10-CM

## 2019-03-17 MED ORDER — BENZONATATE 100 MG PO CAPS: 100.0000 mg | ORAL_CAPSULE | Freq: Three times a day (TID) | ORAL | 0 refills | Status: DC | PRN

## 2019-03-17 MED ORDER — ALBUTEROL SULFATE HFA 108 (90 BASE) MCG/ACT IN AERS
2.0000 | INHALATION_SPRAY | Freq: Four times a day (QID) | RESPIRATORY_TRACT | 0 refills | Status: DC | PRN
Start: 2019-03-17 — End: 2019-11-03

## 2019-03-17 NOTE — Progress Notes (Signed)
E-Visit for Corona Virus Screening  Based on your current symptoms, you may very well have the virus, however your symptoms are mild. Currently, not all patients are being tested. If the symptoms are mild and there is not a known exposure, performing the test is not indicated.  If your shortness of breath worsens or does not improve you will need to go to the ED to be evaluated.  Approximately 5 minutes was spent documenting and reviewing patient's chart.    You have been enrolled in [place] for COVID-19. Daily you will receive a questionnaire within the [facility] website. Our COVID-19 response team will be monitoring your responses daily.   Coronavirus disease 2019 (COVID-19) is a respiratory illness that can spread from person to person. The virus that causes COVID-19 is a new virus that was first identified in the country of [place] but is now found in multiple other countries and has spread to the [place].  Symptoms associated with the virus are mild to severe fever, cough, and shortness of breath. There is currently no vaccine to protect against COVID-19, and there is no specific antiviral treatment for the virus.   To be considered HIGH RISK for Coronavirus (COVID-19), you have to meet the following criteria:  . Traveled to [place], [place], [place], [place] or [place]; or in the [place] to [place], [place], [place], or [place]; and have fever, cough, and shortness of breath within the last 2 weeks of travel OR  . Been in close contact with a person diagnosed with COVID-19 within the last 2 weeks and have fever, cough, and shortness of breath  . IF YOU DO NOT MEET THESE CRITERIA, YOU ARE CONSIDERED LOW RISK FOR COVID-19.   It is vitally important that if you feel that you have an infection such as this virus or any other virus that you stay home and away from places where you may spread it to others.  You should self-quarantine for 14 days if you have symptoms  that could potentially be coronavirus and avoid contact with people age 20 and older.   You can use medication such as A prescription cough medication called Tessalon Perles 100 mg. You may take 1-2 capsules every 8 hours as needed for cough and A prescription inhaler called Albuterol MDI 90 mcg /actuation 2 puffs every 4 hours as needed for shortness of breath, wheezing, cough  You may also take acetaminophen (Tylenol) as needed for fever.   Reduce your risk of any infection by using the same precautions used for avoiding the common cold or flu:  Marland Kitchen Wash your hands often with soap and warm water for at least 20 seconds.  If soap and water are not readily available, use an alcohol-based hand sanitizer with at least 60% alcohol.  . If coughing or sneezing, cover your mouth and nose by coughing or sneezing into the elbow areas of your shirt or coat, into a tissue or into your sleeve (not your hands). . Avoid shaking hands with others and consider head nods or verbal greetings only. . Avoid touching your eyes, nose, or mouth with unwashed hands.  . Avoid close contact with people who are sick. . Avoid places or events with large numbers of people in one location, like concerts or sporting events. . Carefully consider travel plans you have or are making. . If you are planning any travel outside or inside the [place], visit the CDC's Travelers' Health webpage for the latest health  notices. . If you have some symptoms but not all symptoms, continue to monitor at home and seek medical attention if your symptoms worsen. . If you are having a medical emergency, call 911.  HOME CARE . Only take medications as instructed by your medical team. . Drink plenty of fluids and get plenty of rest. . A steam or ultrasonic humidifier can help if you have congestion.   GET HELP RIGHT AWAY IF: . You develop worsening fever. . You become short of breath . You cough up blood. . Your symptoms become more severe MAKE  SURE YOU   Understand these instructions.  Will watch your condition.  Will get help right away if you are not doing well or get worse.  Your e-visit answers were reviewed by a board certified advanced clinical practitioner to complete your personal care plan.  Depending on the condition, your plan could have included both over the counter or prescription medications.  If there is a problem please reply once you have received a response from your provider. Your safety is important to [place].  If you have drug allergies check your prescription carefully.    You can use MyChart to ask questions about today's visit, request a non-urgent call back, or ask for a work or school excuse for 24 hours related to this e-Visit. If it has been greater than 24 hours you will need to follow up with your provider, or enter a new e-Visit to address those concerns. You will get an e-mail in the next two days asking about your experience.  I hope that your e-visit has been valuable and will speed your recovery. Thank you for using e-visits.

## 2019-03-18 ENCOUNTER — Encounter (INDEPENDENT_AMBULATORY_CARE_PROVIDER_SITE_OTHER): Payer: Self-pay

## 2019-03-19 ENCOUNTER — Encounter (INDEPENDENT_AMBULATORY_CARE_PROVIDER_SITE_OTHER): Payer: Self-pay

## 2019-03-20 ENCOUNTER — Encounter (INDEPENDENT_AMBULATORY_CARE_PROVIDER_SITE_OTHER): Payer: Self-pay

## 2019-03-23 ENCOUNTER — Encounter (INDEPENDENT_AMBULATORY_CARE_PROVIDER_SITE_OTHER): Payer: Self-pay

## 2019-03-28 ENCOUNTER — Encounter (INDEPENDENT_AMBULATORY_CARE_PROVIDER_SITE_OTHER): Payer: Self-pay

## 2019-04-05 ENCOUNTER — Other Ambulatory Visit: Payer: Self-pay | Admitting: Family

## 2019-04-05 DIAGNOSIS — Z20822 Contact with and (suspected) exposure to covid-19: Secondary | ICD-10-CM

## 2019-07-01 ENCOUNTER — Other Ambulatory Visit: Payer: Self-pay | Admitting: Family Medicine

## 2019-07-01 DIAGNOSIS — R519 Headache, unspecified: Secondary | ICD-10-CM

## 2019-07-01 DIAGNOSIS — F410 Panic disorder [episodic paroxysmal anxiety] without agoraphobia: Secondary | ICD-10-CM

## 2019-07-30 ENCOUNTER — Other Ambulatory Visit: Payer: Self-pay | Admitting: Family Medicine

## 2019-08-07 ENCOUNTER — Encounter: Payer: Self-pay | Admitting: Certified Nurse Midwife

## 2019-08-13 ENCOUNTER — Encounter: Payer: Self-pay | Admitting: Certified Nurse Midwife

## 2019-09-11 ENCOUNTER — Encounter: Payer: BLUE CROSS/BLUE SHIELD | Admitting: Family Medicine

## 2019-09-11 NOTE — Progress Notes (Signed)
  Patient was unavailable online for her virtual visit.  She was unable to be reached by phone as well.  No-show for the visit

## 2019-09-16 ENCOUNTER — Other Ambulatory Visit: Payer: Self-pay

## 2019-09-16 DIAGNOSIS — Z20822 Contact with and (suspected) exposure to covid-19: Secondary | ICD-10-CM

## 2019-09-16 NOTE — Progress Notes (Unsigned)
l °

## 2019-09-18 LAB — NOVEL CORONAVIRUS, NAA: SARS-CoV-2, NAA: NOT DETECTED

## 2019-09-25 ENCOUNTER — Other Ambulatory Visit: Payer: Self-pay

## 2019-09-25 ENCOUNTER — Telehealth: Payer: BLUE CROSS/BLUE SHIELD | Admitting: Nurse Practitioner

## 2019-09-25 ENCOUNTER — Encounter: Payer: Self-pay | Admitting: Family Medicine

## 2019-09-25 ENCOUNTER — Telehealth (INDEPENDENT_AMBULATORY_CARE_PROVIDER_SITE_OTHER): Payer: BLUE CROSS/BLUE SHIELD | Admitting: Family Medicine

## 2019-09-25 DIAGNOSIS — R635 Abnormal weight gain: Secondary | ICD-10-CM | POA: Diagnosis not present

## 2019-09-25 DIAGNOSIS — R5383 Other fatigue: Secondary | ICD-10-CM

## 2019-09-25 DIAGNOSIS — F32A Depression, unspecified: Secondary | ICD-10-CM

## 2019-09-25 DIAGNOSIS — R52 Pain, unspecified: Secondary | ICD-10-CM

## 2019-09-25 DIAGNOSIS — F329 Major depressive disorder, single episode, unspecified: Secondary | ICD-10-CM

## 2019-09-25 DIAGNOSIS — G43109 Migraine with aura, not intractable, without status migrainosus: Secondary | ICD-10-CM

## 2019-09-25 DIAGNOSIS — N951 Menopausal and female climacteric states: Secondary | ICD-10-CM | POA: Diagnosis not present

## 2019-09-25 DIAGNOSIS — R42 Dizziness and giddiness: Secondary | ICD-10-CM

## 2019-09-25 MED ORDER — OMEPRAZOLE 20 MG PO CPDR: 20.0000 mg | DELAYED_RELEASE_CAPSULE | Freq: Every day | ORAL | 1 refills | Status: DC

## 2019-09-25 NOTE — Progress Notes (Signed)
Patient: Sandra Wilkins Female    DOB: 09-11-1966   53 y.o.   MRN: [id number] Visit Date: 09/25/2019  Today's Provider: Lavon Paganini, MD   No chief complaint on file.  Subjective:     HPI   Virtual Visit via Video Note  I connected with [name] on 09/25/19 at  8:40 AM EST by a video enabled telemedicine application and verified that I am speaking with the correct person using two identifiers.   Patient location: home Provider location: [place] involved in the visit: patient, provider   I discussed the limitations of evaluation and management by telemedicine and the availability of in person appointments. The patient expressed understanding and agreed to proceed.   Patient has been struggling with brain fog (especially at work).  This has made depression worse.  Has ocular migraines (diagnosed at [facility]) and is struggling with these despite amitriptyline (takes as needed).  Develops blurred vision when these occur.  She has to take a break from looking at a screen. Dizziness comes with that.  Has to take a nap in the middle of the day at work, which is not her normal.  After being up for 3-4 hours on the weekend, she will need a nap.  Also feels as though she is having "noodle" feeling in legs.  Has gained a lot of weight and is now 300lbs.  She has been avoiding others since March, but has been going to work (pandemic). She took COVID test last week and it was negative.   Allergies  Allergen Reactions   Codeine Hives   Other Hives and Itching    Metronidazole or Ciprofloxacin     Sulfa Antibiotics Hives and Itching   Tramadol Other (See Comments)    Hallucinations   Penicillins Rash    Has patient had a PCN reaction causing immediate rash, facial/tongue/throat swelling, SOB or lightheadedness with hypotension: Unknown Has patient had a PCN reaction causing severe rash involving mucus membranes or skin necrosis:  Unknown Has patient had a PCN reaction that required hospitalization: Unknown Has patient had a PCN reaction occurring within the last 10 years: No If all of the above answers are "NO", then may proceed with Cephalosporin use.      Current Outpatient Medications:    albuterol (VENTOLIN HFA) 108 (90 Base) MCG/ACT inhaler, Inhale 2 puffs into the lungs every 6 (six) hours as needed for wheezing or shortness of breath., Disp: 1 Inhaler, Rfl: 0   ALPRAZolam (XANAX) 0.5 MG tablet, TAKE 1/2 TO 2 TABLETS BY MOUTH EVERY 4 HOURS IF NEEDED FOR PANIC ATTACKS, Disp: 30 tablet, Rfl: 3   amitriptyline (ELAVIL) 10 MG tablet, TAKE 1 TABLET BY MOUTH AT BEDTIME FOR 7 DAYS THEN INCREASE TO 2 TABS AT BEDTIME, Disp: 180 tablet, Rfl: 2   benzonatate (TESSALON PERLES) 100 MG capsule, Take 1 capsule (100 mg total) by mouth 3 (three) times daily as needed., Disp: 20 capsule, Rfl: 0   Biotin 5 MG CAPS, Take 2 capsules by mouth daily., Disp: , Rfl:    cetirizine (ZYRTEC) 10 MG tablet, Take 10 mg by mouth every morning. NOT TAKING SINCE SHE HAS BEEN SICK WITH GALLBLADDER, Disp: , Rfl:    furosemide (LASIX) 20 MG tablet, Take 20 mg by mouth., Disp: , Rfl:    Ibuprofen 200 MG CAPS, Take 400 mg by mouth 2 (two) times daily as needed (PAIN). , Disp: , Rfl:    meloxicam (  MOBIC) 15 MG tablet, Take 1 tablet (15 mg total) by mouth daily. (Patient taking differently: Take 15 mg by mouth as needed. ), Disp: 30 tablet, Rfl: 2   Multiple Vitamin (MULTIVITAMIN) tablet, Take 1 tablet by mouth daily., Disp: , Rfl:    omeprazole (PRILOSEC) 20 MG capsule, TAKE 1 CAPSULE (20 MG TOTAL) BY MOUTH TWICE DAILY, Disp: 180 capsule, Rfl: 1   sertraline (ZOLOFT) 100 MG tablet, TAKE 1 TABLET BY MOUTH TWICE A DAY, Disp: 180 tablet, Rfl: 0   tizanidine (ZANAFLEX) 2 MG capsule, TAKE 1 CAPSULE BY MOUTH THREE TIMES A DAY AS NEEDED, Disp: 30 capsule, Rfl: 2  Review of Systems  Constitutional: Positive for activity change and fatigue.  Negative for chills and fever.  HENT: Negative.   Respiratory: Negative for shortness of breath and wheezing.   Cardiovascular: Negative.   Gastrointestinal: Negative.   Genitourinary: Negative.   Skin: Negative.   Neurological: Positive for dizziness and headaches. Negative for seizures, speech difficulty, weakness and numbness.  Psychiatric/Behavioral: Positive for decreased concentration and dysphoric mood. Negative for hallucinations, self-injury and suicidal ideas.    Social History   Tobacco Use   Smoking status: Former Smoker    Packs/day: 0.50    Years: 20.00    Pack years: 10.00    Types: Cigarettes    Quit date: 11/13/2005    Years since quitting: 13.8   Smokeless tobacco: Never Used  Substance Use Topics   Alcohol use: Yes    Comment: occasional       Objective:   There were no vitals taken for this visit. There were no vitals filed for this visit.There is no height or weight on file to calculate BMI.   Physical Exam Speaking in full sentences in no apparent distress  No results found for any visits on 09/25/19.     Assessment & Plan   I discussed the assessment and treatment plan with the patient. The patient was provided an opportunity to ask questions and all were answered. The patient agreed with the plan and demonstrated an understanding of the instructions.   The patient was advised to call back or seek an in-person evaluation if the symptoms worsen or if the condition fails to improve as anticipated.  1. 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 Perimenopausal -Discussed with patient that her perimenopausal state with her irregular menses may also contribute to her fatigue, weight gain, decreased concentration -Patient is followed by GYN and could discuss whether hormone replacement therapy would be a good option for her  3. Weight gain 4. Other fatigue -Weight gain and fatigue have a broad differential and I discussed this with the patient -We will check TSH, CBC, CMP, B12, vitamin D - discussed importance of good control of depression and staying active -This could also be related to her uncontrolled ocular migraines or her perimenopausal state - TSH - CBC w/Diff/Platelet - CMP (Comprehensive metabolic panel) - 123456 - Vit D  25  hydroxy  5. Depression, unspecified depression type -Chronic and previously well controlled, but worsening over the last few months -Discussed with patient that it is hard to tell whether her depression is causing some of her other symptoms or struggling with her other symptoms is causing her depression to be worse - We will continue her Zoloft at 100 mg twice daily -We are changing amitriptyline to daily and not as needed use as above, so this may help with depression some as well -Consider Wellbutrin in the future - TSH - CBC w/Diff/Platelet - CMP (Comprehensive metabolic panel) - 123456 - Vit D  25 hydroxy   The entirety of the information documented in the History of Present Illness, Review of Systems and Physical Exam were personally obtained by me. Portions of this information were initially documented by Ashley Royalty, CMA and reviewed by me for thoroughness and accuracy.    Garison Genova, Dionne Bucy, MD MPH [facility]

## 2019-09-25 NOTE — Progress Notes (Signed)
Based on what you shared with me it looks like you have multiple,that should be evaluated in a face to face office visit. Due to the symptoms you are describing, I cannot make a definitive diagnosis. You need to see your PCP and have lab work done. I am not able to do any of that in an e visit.    NOTE: If you entered your credit card information for this eVisit, you will not be charged. You may see a "hold" on your card for the $35 but that hold will drop off and you will not have a charge processed.  If you are having a true medical emergency please call 911.     For an urgent face to face visit, [place] has four urgent care centers for your convenience:   . [facility]    [phone]                  Get Driving Directions  [phone] [place], [place] [address] . 10 am to 8 pm Monday-Friday . 12 pm to 8 pm Saturday-Sunday   . [facility] at [place]                  Get Driving Directions  [phone] [place], [place], [place] [address] . 8 am to 8 pm Monday-Friday . 9 am to 6 pm Saturday . 11 am to 6 pm Sunday   . [facility] at [place]                  Get Driving Directions   [address], [place] [address] . 8 am to 8 pm Monday-Friday . 8 am to 4 pm Saturday-Sunday    . [facility] at [place]                    Get Driving Directions  [id number]  [address] [place], [place] [address]  . Monday-Friday, 12 PM to 6 PM    Your e-visit answers were reviewed by a board certified advanced clinical practitioner to complete your personal care plan.  Thank you for using e-Visits.

## 2019-09-27 ENCOUNTER — Telehealth: Payer: Self-pay

## 2019-09-27 LAB — COMPREHENSIVE METABOLIC PANEL
ALT: 85 IU/L — ABNORMAL HIGH (ref 0–32)
AST: 57 IU/L — ABNORMAL HIGH (ref 0–40)
Albumin/Globulin Ratio: 1.9 (ref 1.2–2.2)
Albumin: 4.3 g/dL (ref 3.8–4.9)
Alkaline Phosphatase: 127 IU/L — ABNORMAL HIGH (ref 39–117)
BUN/Creatinine Ratio: 14 (ref 9–23)
BUN: 15 mg/dL (ref 6–24)
Bilirubin Total: 0.4 mg/dL (ref 0.0–1.2)
CO2: 22 mmol/L (ref 20–29)
Calcium: 8.9 mg/dL (ref 8.7–10.2)
Chloride: 101 mmol/L (ref 96–106)
Creatinine, Ser: 1.04 mg/dL — ABNORMAL HIGH (ref 0.57–1.00)
GFR calc Af Amer: 71 mL/min/{1.73_m2} (ref >59)
GFR calc non Af Amer: 62 mL/min/{1.73_m2} (ref >59)
Globulin, Total: 2.3 g/dL (ref 1.5–4.5)
Glucose: 109 mg/dL — ABNORMAL HIGH (ref 65–99)
Potassium: 4.1 mmol/L (ref 3.5–5.2)
Sodium: 139 mmol/L (ref 134–144)
Total Protein: 6.6 g/dL (ref 6.0–8.5)

## 2019-09-27 LAB — CBC WITH DIFFERENTIAL/PLATELET
Basophils Absolute: 0.1 10*3/uL (ref 0.0–0.2)
Basos: 1 %
EOS (ABSOLUTE): 0.2 10*3/uL (ref 0.0–0.4)
Eos: 3 %
Hematocrit: 43.9 % (ref 34.0–46.6)
Hemoglobin: 14.2 g/dL (ref 11.1–15.9)
Immature Grans (Abs): 0.1 10*3/uL (ref 0.0–0.1)
Immature Granulocytes: 2 %
Lymphocytes Absolute: 1.9 10*3/uL (ref 0.7–3.1)
Lymphs: 28 %
MCH: 28.6 pg (ref 26.6–33.0)
MCHC: 32.3 g/dL (ref 31.5–35.7)
MCV: 88 fL (ref 79–97)
Monocytes Absolute: 0.5 10*3/uL (ref 0.1–0.9)
Monocytes: 7 %
Neutrophils Absolute: 4.1 10*3/uL (ref 1.4–7.0)
Neutrophils: 59 %
Platelets: 195 10*3/uL (ref 150–450)
RBC: 4.97 x10E6/uL (ref 3.77–5.28)
RDW: 14.3 % (ref 11.7–15.4)
WBC: 6.8 10*3/uL (ref 3.4–10.8)

## 2019-09-27 LAB — VITAMIN B12: Vitamin B-12: 623 pg/mL (ref 232–1245)

## 2019-09-27 LAB — VITAMIN D 25 HYDROXY (VIT D DEFICIENCY, FRACTURES): Vit D, 25-Hydroxy: 29.6 ng/mL — ABNORMAL LOW (ref 30.0–100.0)

## 2019-09-27 LAB — TSH: TSH: 3.17 u[IU]/mL (ref 0.450–4.500)

## 2019-09-27 NOTE — Telephone Encounter (Signed)
Let's give it 3-4 weeks on the daily amitriptyline and schedule f/u visit.  May want to consider Wellbutrin in the future as well.  Could also discuss with GYN about perimenopausal symptoms.

## 2019-09-27 NOTE — Telephone Encounter (Signed)
-----   Message from Virginia Crews, MD sent at 09/27/2019  1:05 PM EST ----- Normal/stable labs

## 2019-09-27 NOTE — Telephone Encounter (Signed)
Pt advised.  Follow up apt made for 10/16/2019  Thanks,   -Mickel Baas

## 2019-09-27 NOTE — Telephone Encounter (Signed)
Pt advised.  She is still feeling bad and is wanting to know what the next step is.  Pt reports no improvement from video visit on 09/25/2019  Thanks,   -Mickel Baas

## 2019-09-27 NOTE — Telephone Encounter (Signed)
LMTCB 09/27/2019  Thanks,   -Mickel Baas

## 2019-10-04 ENCOUNTER — Other Ambulatory Visit: Payer: Self-pay | Admitting: Family Medicine

## 2019-10-15 NOTE — Progress Notes (Signed)
Patient: Sandra Wilkins Female    DOB: August 30, 1966   53 y.o.   MRN: [id number] Visit Date: 10/16/2019  Today's Provider: Lavon Paganini, MD   Chief Complaint  Patient presents with  . Follow-up   Subjective:    Virtual Visit via Video Note  I connected with Sandra Wilkins on 10/16/19 at  8:00 AM EST by a video enabled telemedicine application and verified that I am speaking with the correct person using two identifiers.  Patient location: home Provider location: [place] involved in the visit: patient, provider   I discussed the limitations of evaluation and management by telemedicine and the availability of in person appointments. The patient expressed understanding and agreed to proceed.  HPI  Follow up for depression and headache  The patient was last seen for this 4 weeks ago. Changes made at last visit include patient advised to continue Zoloft and to take amitriptyline 100mg  daily.  She reports excellent compliance with treatment. She feels that condition is Improved. Patient reports that her headaches are better.  She is not having side effects.   No headaches since our last visit.  Has still had some minor dizziness episodes.  Denies room spinning.  Feels imbalanced.  States depression is worsening.  Thinks this may have to do with only being able to go to work and come home due to pandemic.  Also having marital issues and work stress.  She is sleeping a lot.  Thinks that feeling unwell physically may contribute to her depressed mood.  Depression screen [facility] 2/9 10/15/2019 02/14/2018 01/11/2017  Decreased Interest 3 2 2   Down, Depressed, Hopeless 2 1 2   PHQ - 2 Score 5 3 4   Altered sleeping 3 2 3   Tired, decreased energy 3 2 2   Change in appetite 3 3 1   Feeling bad or failure about yourself  3 2 1   Trouble concentrating 3 3 2   Moving slowly or fidgety/restless 3 1 2   Suicidal thoughts 0 0 0  PHQ-9 Score 23 16 15   Difficult doing  work/chores Very difficult Not difficult at all -   GAD 7 : Generalized Anxiety Score 10/15/2019  Nervous, Anxious, on Edge 0  Control/stop worrying 2  Worry too much - different things 3  Trouble relaxing 2  Restless 0  Easily annoyed or irritable 1  Afraid - awful might happen 0  Total GAD 7 Score 8  Anxiety Difficulty Somewhat difficult   ------------------------------------------------------------------------------------   Allergies  Allergen Reactions  . Codeine Hives  . Other Hives and Itching    Metronidazole or Ciprofloxacin    . Sulfa Antibiotics Hives and Itching  . Tramadol Other (See Comments)    Hallucinations  . Penicillins Rash    Has patient had a PCN reaction causing immediate rash, facial/tongue/throat swelling, SOB or lightheadedness with hypotension: Unknown Has patient had a PCN reaction causing severe rash involving mucus membranes or skin necrosis: Unknown Has patient had a PCN reaction that required hospitalization: Unknown Has patient had a PCN reaction occurring within the last 10 years: No If all of the above answers are "NO", then may proceed with Cephalosporin use.      Current Outpatient Medications:  .  albuterol (VENTOLIN HFA) 108 (90 Base) MCG/ACT inhaler, Inhale 2 puffs into the lungs every 6 (six) hours as needed for wheezing or shortness of breath., Disp: 1 Inhaler, Rfl: 0 .  ALPRAZolam (XANAX) 0.5 MG tablet, TAKE 1/2 TO 2 TABLETS BY MOUTH  EVERY 4 HOURS IF NEEDED FOR PANIC ATTACKS, Disp: 30 tablet, Rfl: 3 .  amitriptyline (ELAVIL) 10 MG tablet, TAKE 1 TABLET BY MOUTH AT BEDTIME FOR 7 DAYS THEN INCREASE TO 2 TABS AT BEDTIME, Disp: 180 tablet, Rfl: 2 .  cetirizine (ZYRTEC) 10 MG tablet, Take 10 mg by mouth every morning. NOT TAKING SINCE SHE HAS BEEN SICK WITH GALLBLADDER, Disp: , Rfl:  .  Ibuprofen 200 MG CAPS, Take 400 mg by mouth 2 (two) times daily as needed (PAIN). , Disp: , Rfl:  .  Multiple Vitamin (MULTIVITAMIN) tablet, Take 1 tablet  by mouth daily., Disp: , Rfl:  .  omeprazole (PRILOSEC) 20 MG capsule, Take 1 capsule (20 mg total) by mouth daily., Disp: 180 capsule, Rfl: 1 .  sertraline (ZOLOFT) 100 MG tablet, TAKE 1 TABLET BY MOUTH TWICE A DAY, Disp: 180 tablet, Rfl: 3 .  furosemide (LASIX) 20 MG tablet, Take 20 mg by mouth., Disp: , Rfl:   Review of Systems  Constitutional: Positive for fatigue. Negative for activity change, appetite change, chills, diaphoresis, fever and unexpected weight change.  HENT: Negative.   Respiratory: Negative.   Cardiovascular: Negative.   Musculoskeletal: Positive for arthralgias and back pain.  Neurological: Positive for dizziness. Negative for syncope, facial asymmetry, speech difficulty, light-headedness and headaches.  Psychiatric/Behavioral: Positive for confusion, decreased concentration, dysphoric mood and sleep disturbance. Negative for agitation, behavioral problems, hallucinations, self-injury and suicidal ideas. The patient is nervous/anxious. The patient is not hyperactive.     Social History   Tobacco Use  . Smoking status: Former Smoker    Packs/day: 0.50    Years: 20.00    Pack years: 10.00    Types: Cigarettes    Quit date: 11/13/2005    Years since quitting: 13.9  . Smokeless tobacco: Never Used  Substance Use Topics  . Alcohol use: Yes    Comment: occasional       Objective:   There were no vitals taken for this visit. There were no vitals filed for this visit.There is no height or weight on file to calculate BMI.   Physical Exam Constitutional:      General: She is not in acute distress.    Appearance: Normal appearance. She is obese. She is not diaphoretic.  HENT:     Head: Normocephalic and atraumatic.  Eyes:     Extraocular Movements: Extraocular movements intact.  Pulmonary:     Effort: Pulmonary effort is normal. No respiratory distress.  Neurological:     Mental Status: She is alert and oriented to person, place, and time. Mental status is  at baseline.  Psychiatric:        Mood and Affect: Affect normal. Mood is depressed.        Speech: Speech normal.        Behavior: Behavior normal.        Thought Content: Thought content normal.      No results found for any visits on 10/16/19.     Assessment & Plan      I discussed the limitations of evaluation and management by telemedicine and the availability of in person appointments. The patient expressed understanding and agreed to proceed.  I discussed the assessment and treatment plan with the patient. The patient was provided an opportunity to ask questions and all were answered. The patient agreed with the plan and demonstrated an understanding of the instructions.   The patient was advised to call back or seek an in-person evaluation if the  symptoms worsen or if the condition fails to improve as anticipated.  I provided 25 minutes of non-face-to-face time during this encounter.  Problem List Items Addressed This Visit      Cardiovascular and Mediastinum   Ocular migraine - Primary    Much improved with taking her prophylactic amitriptyline regularly No headaches since her last visit Continue amitriptyline 100 mg nightly Appears neuro intact, but we did discuss inability to perform complete neuro exam on a virtual visit Return precautions discussed        Other   Anxiety disorder    Chronic and uncontrolled Patient believes that her current stressors and external factors are contributing to this She does not believe that any change in medication would help I did encourage therapy Continue Zoloft 100 mg twice daily Follow-up in 6 weeks and repeat PHQ-9 and GAD-7 Contracted for safety-no SI/HI      Clinical depression    Chronic and uncontrolled Patient believes that her current stressors and external factors are contributing to this She does not believe that any change in medication would help I did encourage therapy Continue Zoloft 100 mg twice daily  Follow-up in 6 weeks and repeat PHQ-9 and GAD-7 Contracted for safety-no SI/HI       Other Visit Diagnoses    Dizziness       Relevant Orders   Ambulatory referral to Physical Therapy    - ongoing issue - denies any vertigo symptoms - seems to have issues with weakness and pain in LEs tht would likely be amenable to physical therapy -Patient agrees to try physical therapy to help with her dizziness -Also discussed getting up slowly from sitting and other dizziness precautions to avoid falls -Discussed that we are unable to perform a complete neuro exam due to the limitations of a virtual visit, but she does seem to be grossly neuro intact on the video today -Discussed return precautions -Follow-up in 6 weeks    Return in about 6 weeks (around 11/27/2019) for MDD/GAD f/u.   The entirety of the information documented in the History of Present Illness, Review of Systems and Physical Exam were personally obtained by me. Portions of this information were initially documented by Lynford Humphrey, CMA and reviewed by me for thoroughness and accuracy.    Bacigalupo, Dionne Bucy, MD MPH [facility]

## 2019-10-16 ENCOUNTER — Ambulatory Visit (INDEPENDENT_AMBULATORY_CARE_PROVIDER_SITE_OTHER): Payer: BLUE CROSS/BLUE SHIELD | Admitting: Family Medicine

## 2019-10-16 ENCOUNTER — Encounter: Payer: Self-pay | Admitting: Family Medicine

## 2019-10-16 DIAGNOSIS — G43109 Migraine with aura, not intractable, without status migrainosus: Secondary | ICD-10-CM | POA: Diagnosis not present

## 2019-10-16 DIAGNOSIS — F331 Major depressive disorder, recurrent, moderate: Secondary | ICD-10-CM | POA: Diagnosis not present

## 2019-10-16 DIAGNOSIS — R42 Dizziness and giddiness: Secondary | ICD-10-CM | POA: Diagnosis not present

## 2019-10-16 DIAGNOSIS — F411 Generalized anxiety disorder: Secondary | ICD-10-CM

## 2019-10-16 NOTE — Assessment & Plan Note (Signed)
Chronic and uncontrolled Patient believes that her current stressors and external factors are contributing to this She does not believe that any change in medication would help I did encourage therapy Continue Zoloft 100 mg twice daily Follow-up in 6 weeks and repeat PHQ-9 and GAD-7 Contracted for safety-no SI/HI

## 2019-10-16 NOTE — Assessment & Plan Note (Signed)
Much improved with taking her prophylactic amitriptyline regularly No headaches since her last visit Continue amitriptyline 100 mg nightly Appears neuro intact, but we did discuss inability to perform complete neuro exam on a virtual visit Return precautions discussed

## 2019-10-21 ENCOUNTER — Other Ambulatory Visit: Payer: Self-pay

## 2019-10-21 DIAGNOSIS — Z20822 Contact with and (suspected) exposure to covid-19: Secondary | ICD-10-CM

## 2019-10-23 LAB — NOVEL CORONAVIRUS, NAA: SARS-CoV-2, NAA: NOT DETECTED

## 2019-11-01 ENCOUNTER — Encounter: Payer: Self-pay | Admitting: Family Medicine

## 2019-11-03 ENCOUNTER — Emergency Department: Payer: BLUE CROSS/BLUE SHIELD

## 2019-11-03 ENCOUNTER — Other Ambulatory Visit: Payer: Self-pay

## 2019-11-03 ENCOUNTER — Encounter: Payer: Self-pay | Admitting: Radiology

## 2019-11-03 ENCOUNTER — Inpatient Hospital Stay
Admission: EM | Admit: 2019-11-03 | Discharge: 2019-11-06 | DRG: 177 | Disposition: A | Discharge status: Home / Self Care | Payer: BLUE CROSS/BLUE SHIELD | Attending: Hospitalist | Admitting: Hospitalist

## 2019-11-03 DIAGNOSIS — D696 Thrombocytopenia, unspecified: Secondary | ICD-10-CM | POA: Diagnosis present

## 2019-11-03 DIAGNOSIS — U071 COVID-19: Secondary | ICD-10-CM | POA: Diagnosis present

## 2019-11-03 DIAGNOSIS — Z825 Family history of asthma and other chronic lower respiratory diseases: Secondary | ICD-10-CM | POA: Diagnosis not present

## 2019-11-03 DIAGNOSIS — R519 Headache, unspecified: Secondary | ICD-10-CM

## 2019-11-03 DIAGNOSIS — J9621 Acute and chronic respiratory failure with hypoxia: Secondary | ICD-10-CM | POA: Diagnosis present

## 2019-11-03 DIAGNOSIS — Z8249 Family history of ischemic heart disease and other diseases of the circulatory system: Secondary | ICD-10-CM | POA: Diagnosis not present

## 2019-11-03 DIAGNOSIS — J1289 Other viral pneumonia: Secondary | ICD-10-CM | POA: Diagnosis present

## 2019-11-03 DIAGNOSIS — R0902 Hypoxemia: Secondary | ICD-10-CM | POA: Diagnosis present

## 2019-11-03 DIAGNOSIS — Z6841 Body Mass Index (BMI) 40.0 and over, adult: Secondary | ICD-10-CM

## 2019-11-03 DIAGNOSIS — G4733 Obstructive sleep apnea (adult) (pediatric): Secondary | ICD-10-CM | POA: Diagnosis present

## 2019-11-03 DIAGNOSIS — I10 Essential (primary) hypertension: Secondary | ICD-10-CM | POA: Diagnosis present

## 2019-11-03 DIAGNOSIS — Z885 Allergy status to narcotic agent status: Secondary | ICD-10-CM

## 2019-11-03 DIAGNOSIS — Z8261 Family history of arthritis: Secondary | ICD-10-CM | POA: Diagnosis not present

## 2019-11-03 DIAGNOSIS — Z8349 Family history of other endocrine, nutritional and metabolic diseases: Secondary | ICD-10-CM | POA: Diagnosis not present

## 2019-11-03 DIAGNOSIS — D6959 Other secondary thrombocytopenia: Secondary | ICD-10-CM | POA: Diagnosis present

## 2019-11-03 DIAGNOSIS — K760 Fatty (change of) liver, not elsewhere classified: Secondary | ICD-10-CM | POA: Diagnosis present

## 2019-11-03 DIAGNOSIS — K851 Biliary acute pancreatitis without necrosis or infection: Secondary | ICD-10-CM | POA: Diagnosis present

## 2019-11-03 DIAGNOSIS — F419 Anxiety disorder, unspecified: Secondary | ICD-10-CM | POA: Diagnosis present

## 2019-11-03 DIAGNOSIS — Z882 Allergy status to sulfonamides status: Secondary | ICD-10-CM

## 2019-11-03 DIAGNOSIS — Z880 Allergy status to penicillin: Secondary | ICD-10-CM | POA: Diagnosis not present

## 2019-11-03 DIAGNOSIS — J1282 Pneumonia due to coronavirus disease 2019: Secondary | ICD-10-CM | POA: Diagnosis present

## 2019-11-03 DIAGNOSIS — K219 Gastro-esophageal reflux disease without esophagitis: Secondary | ICD-10-CM | POA: Diagnosis present

## 2019-11-03 DIAGNOSIS — R0600 Dyspnea, unspecified: Secondary | ICD-10-CM

## 2019-11-03 HISTORY — DX: COVID-19: U07.1

## 2019-11-03 HISTORY — DX: Pneumonia due to coronavirus disease 2019: J12.82

## 2019-11-03 LAB — CBC
HCT: 43 % (ref 36.0–46.0)
Hemoglobin: 14.4 g/dL (ref 12.0–15.0)
MCH: 28 pg (ref 26.0–34.0)
MCHC: 33.5 g/dL (ref 30.0–36.0)
MCV: 83.7 fL (ref 80.0–100.0)
Platelets: 97 10*3/uL — ABNORMAL LOW (ref 150–400)
RBC: 5.14 MIL/uL — ABNORMAL HIGH (ref 3.87–5.11)
RDW: 14.3 % (ref 11.5–15.5)
WBC: 4.7 10*3/uL (ref 4.0–10.5)
nRBC: 0 % (ref 0.0–0.2)

## 2019-11-03 LAB — BASIC METABOLIC PANEL
Anion gap: 10 (ref 5–15)
BUN: 17 mg/dL (ref 6–20)
CO2: 24 mmol/L (ref 22–32)
Calcium: 8.4 mg/dL — ABNORMAL LOW (ref 8.9–10.3)
Chloride: 106 mmol/L (ref 98–111)
Creatinine, Ser: 1.09 mg/dL — ABNORMAL HIGH (ref 0.44–1.00)
GFR calc Af Amer: >60 mL/min (ref >60)
GFR calc non Af Amer: 58 mL/min — ABNORMAL LOW (ref >60)
Glucose, Bld: 116 mg/dL — ABNORMAL HIGH (ref 70–99)
Potassium: 3.6 mmol/L (ref 3.5–5.1)
Sodium: 140 mmol/L (ref 135–145)

## 2019-11-03 LAB — COMPREHENSIVE METABOLIC PANEL
ALT: 99 U/L — ABNORMAL HIGH (ref 0–44)
AST: 92 U/L — ABNORMAL HIGH (ref 15–41)
Albumin: 3.7 g/dL (ref 3.5–5.0)
Alkaline Phosphatase: 82 U/L (ref 38–126)
Anion gap: 13 (ref 5–15)
BUN: 18 mg/dL (ref 6–20)
CO2: 22 mmol/L (ref 22–32)
Calcium: 8 mg/dL — ABNORMAL LOW (ref 8.9–10.3)
Chloride: 102 mmol/L (ref 98–111)
Creatinine, Ser: 1.04 mg/dL — ABNORMAL HIGH (ref 0.44–1.00)
GFR calc Af Amer: >60 mL/min (ref >60)
GFR calc non Af Amer: >60 mL/min (ref >60)
Glucose, Bld: 114 mg/dL — ABNORMAL HIGH (ref 70–99)
Potassium: 3.2 mmol/L — ABNORMAL LOW (ref 3.5–5.1)
Sodium: 137 mmol/L (ref 135–145)
Total Bilirubin: 0.7 mg/dL (ref 0.3–1.2)
Total Protein: 7.1 g/dL (ref 6.5–8.1)

## 2019-11-03 LAB — TROPONIN I (HIGH SENSITIVITY)
Troponin I (High Sensitivity): 22 ng/L — ABNORMAL HIGH (ref <18)
Troponin I (High Sensitivity): 19 ng/L — ABNORMAL HIGH (ref <18)

## 2019-11-03 LAB — ABO/RH: ABO/RH(D): A POS

## 2019-11-03 LAB — FIBRIN DERIVATIVES D-DIMER (ARMC ONLY): Fibrin derivatives D-dimer (ARMC): 561.12 ng/mL (FEU) — ABNORMAL HIGH (ref 0.00–499.00)

## 2019-11-03 IMAGING — DX DG CHEST 1V PORT
1 series · 1 of 1 positions shown · non-contrast
Comparison: None.

CLINICAL DATA: Left-sided chest pain and shortness of breath. COVID
19 virus infection.

EXAM:
PORTABLE CHEST 1 VIEW

[chest ap]
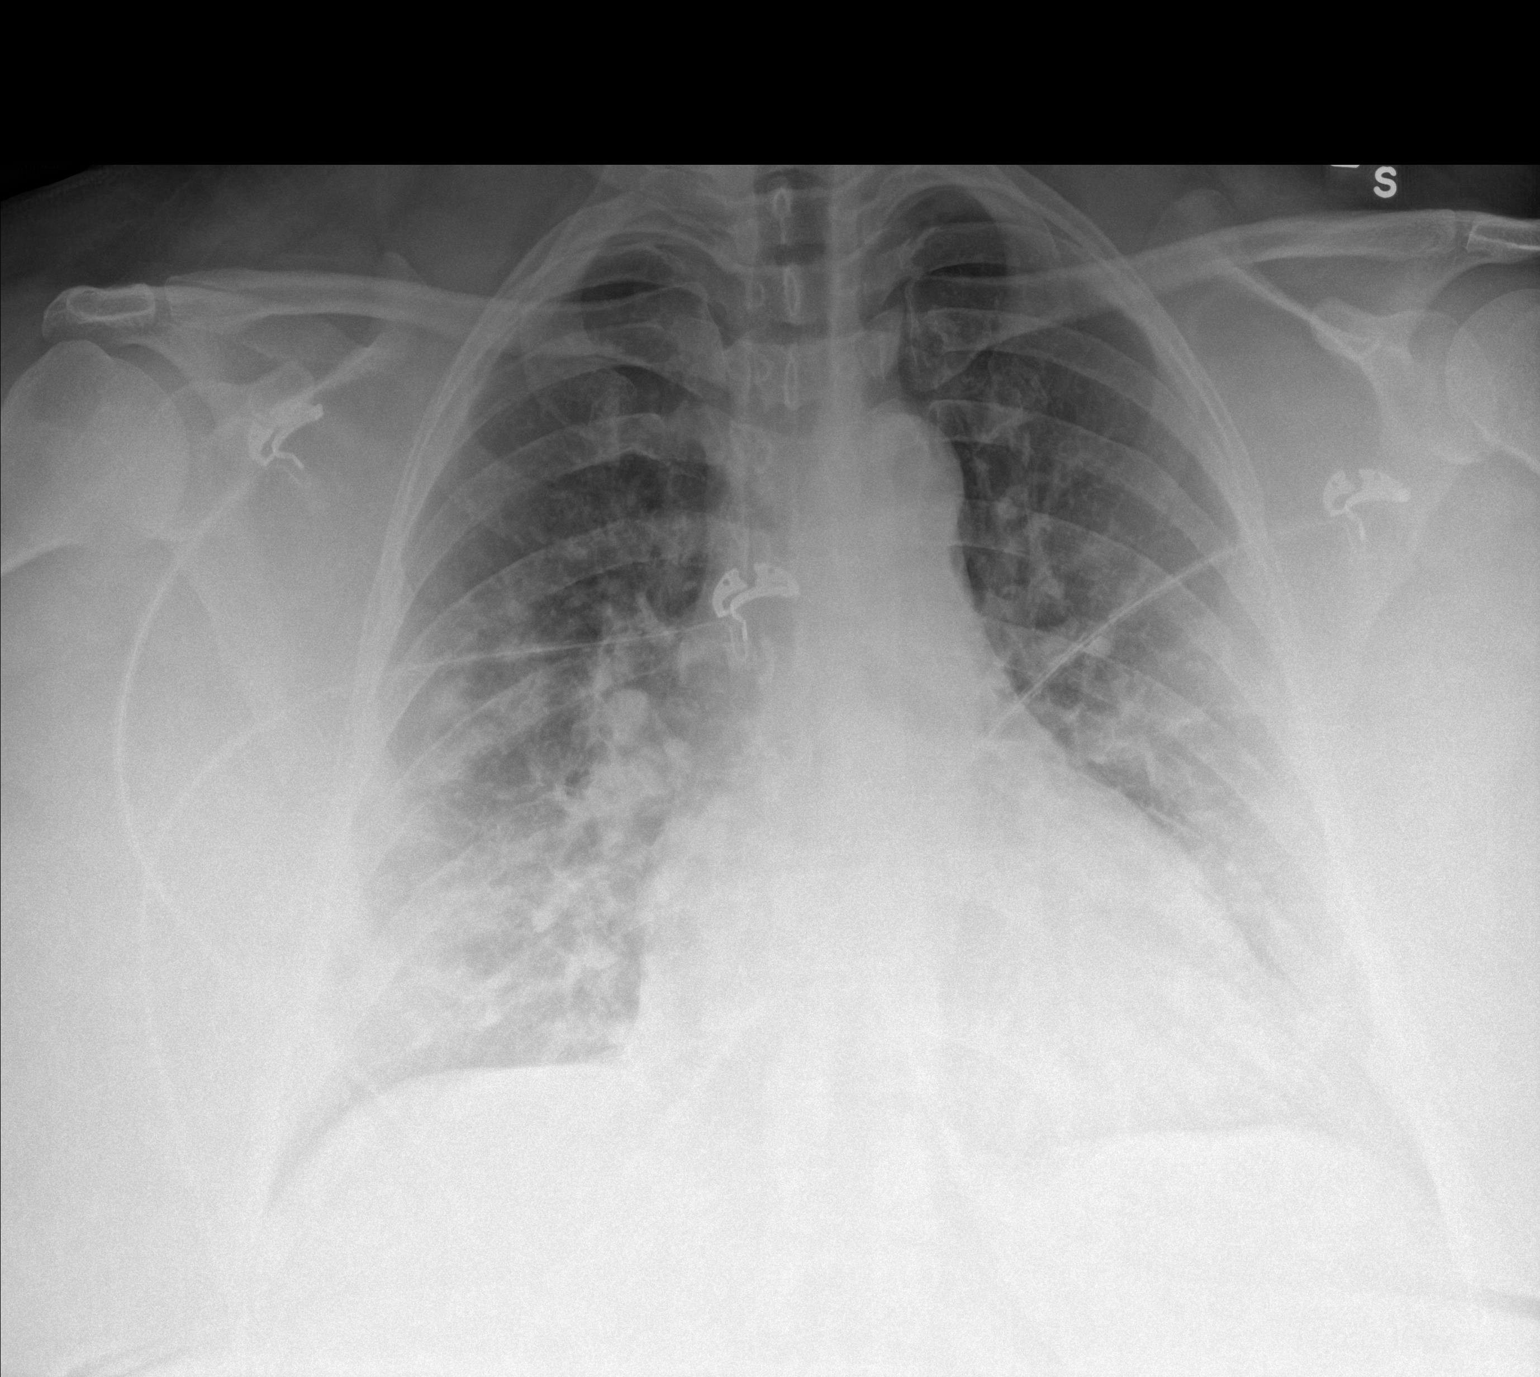

[1 of 1 positions shown; findings below may reference images not displayed]

FINDINGS: Mild cardiomegaly noted. Heterogeneous patchy airspace disease is
noted in the mid and lower lung zones bilaterally. No evidence of
pleural effusion.
IMPRESSION: Heterogeneous patchy airspace disease in mid lower lung zones,
suspicious for viral pneumonia.

## 2019-11-03 IMAGING — CT CT ANGIO CHEST
2 of 12 series · 11 of 46 positions shown · IV contrast (APPLIED)
Comparison: None.

CLINICAL DATA: Pleuritic chest pain. Shortness of breath. Hypoxia.
COVID 19 virus infection.

EXAM:
CT ANGIOGRAPHY CHEST WITH CONTRAST
TECHNIQUE: Multidetector CT imaging of the chest was performed using the
standard protocol during bolus administration of intravenous
contrast. Multiplanar CT image reconstructions and MIPs were
obtained to evaluate the vascular anatomy.
CONTRAST:  150 mL OMNIPAQUE IOHEXOL 350 MG/ML SOLN

[Series 6: thins · axial · 0.70mm/px · z∈[-356,-133]mm · 10 of 273 slices shown]
[im 25/273  lung]
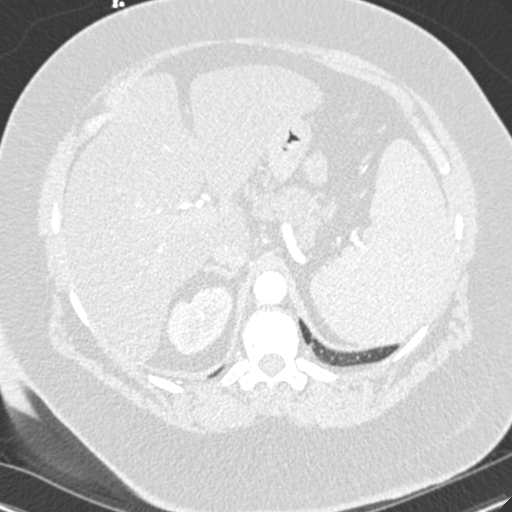
[im 50/273  soft-tissue]
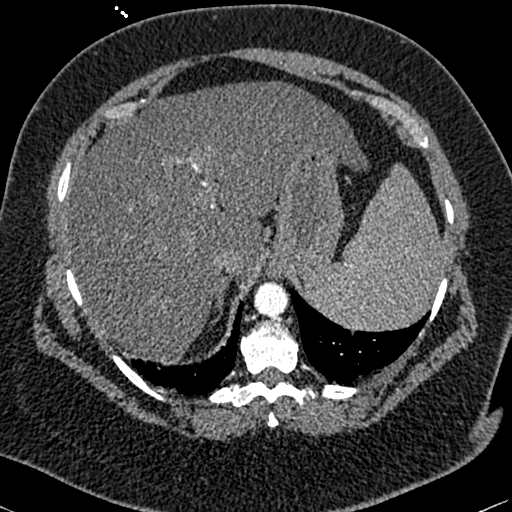
[im 75/273  lung]
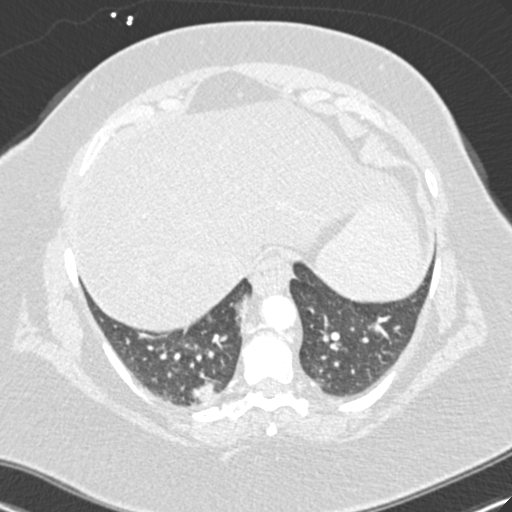
[im 99/273  soft-tissue]
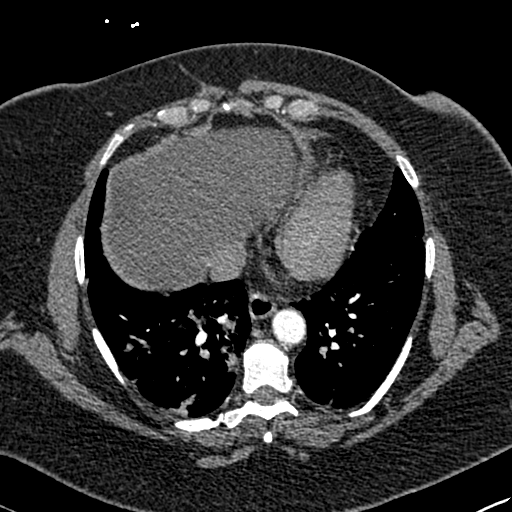
[im 124/273  lung]
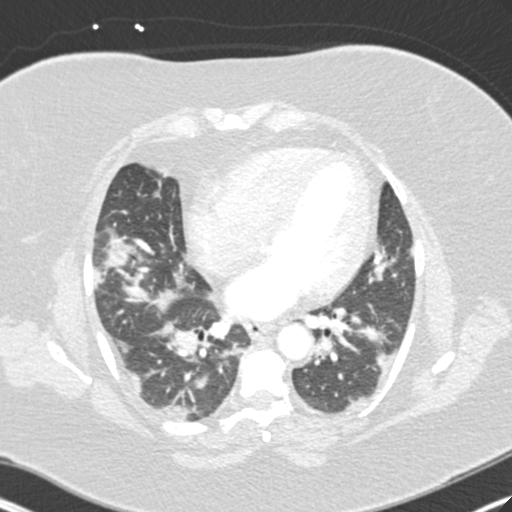
[im 149/273  soft-tissue]
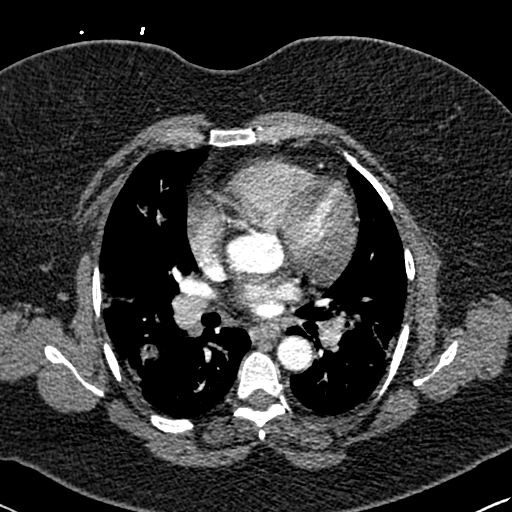
[im 174/273  lung]
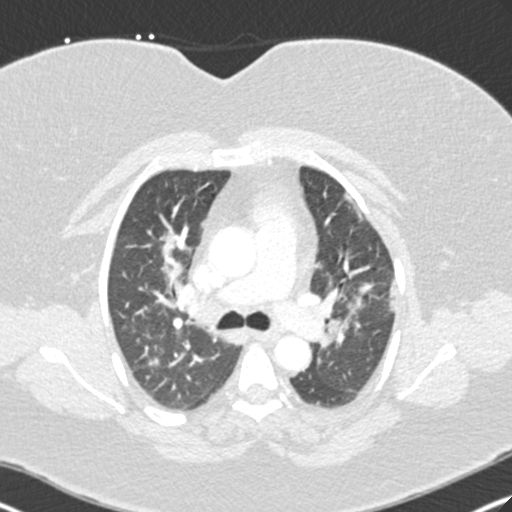
[im 198/273  soft-tissue]
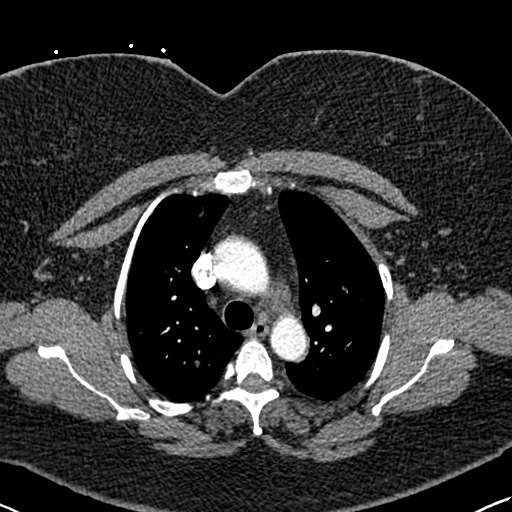
[im 223/273  lung]
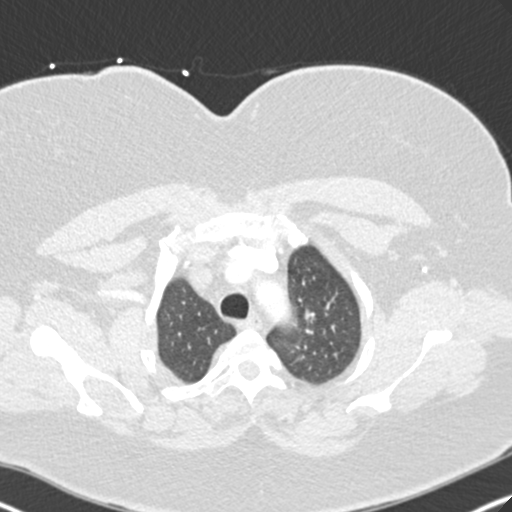
[im 248/273  soft-tissue]
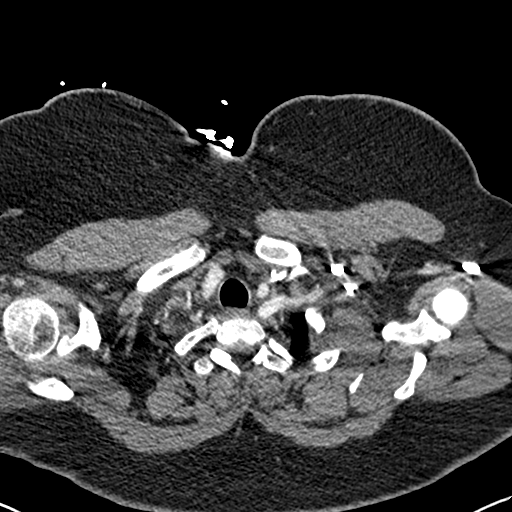

[Series 8: coronal mpr · coronal · 0.54mm/px · 1 of 115 slices shown]
[im 58/115  soft-tissue]
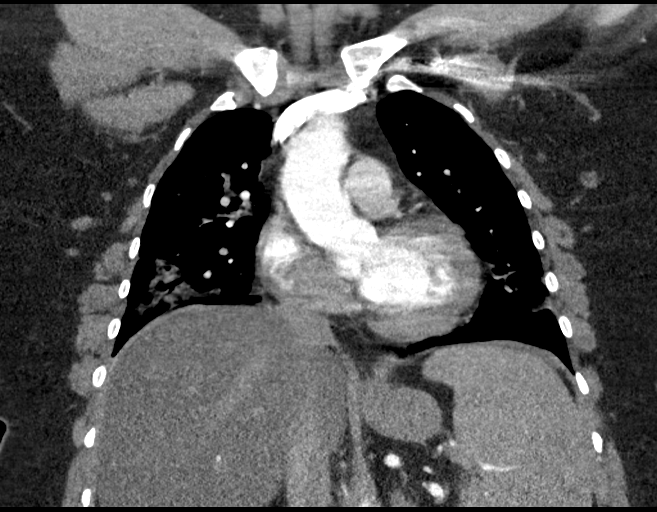

[11 of 46 positions shown; findings below may reference images not displayed]

FINDINGS: Cardiovascular: Satisfactory opacification of pulmonary arteries
noted, and no pulmonary emboli identified. No evidence of thoracic
aortic dissection or aneurysm.

Mediastinum/Nodes: No masses or pathologically enlarged lymph nodes
identified.

Lungs/Pleura: Multifocal ill-defined areas of consolidation are seen
throughout both lungs, suspicious for atypical/viral pneumonia. No
evidence of pleural effusion.

Upper abdomen: Severe hepatic steatosis.

Musculoskeletal: No suspicious bone lesions identified.

Review of the MIP images confirms the above findings.
IMPRESSION: 1. No evidence of pulmonary embolism.
2. Multifocal ill-defined areas of consolidation throughout both
lungs, suspicious for atypical/viral pneumonia.
3. Severe hepatic steatosis.

## 2019-11-03 MED ORDER — ENOXAPARIN SODIUM 40 MG/0.4ML ~~LOC~~ SOLN
40.0000 mg | SUBCUTANEOUS | Status: DC
  Administered 2019-11-03 – 2019-11-05 (×3): 40 mg via SUBCUTANEOUS
  Filled 2019-11-03 (×5): qty 0.4

## 2019-11-03 MED ORDER — SODIUM CHLORIDE 0.9 % IV SOLN
100.0000 mg | Freq: Every day | INTRAVENOUS | Status: DC
  Administered 2019-11-04 – 2019-11-06 (×3): 100 mg via INTRAVENOUS
  Filled 2019-11-03 (×3): qty 100

## 2019-11-03 MED ORDER — DEXAMETHASONE SODIUM PHOSPHATE 10 MG/ML IJ SOLN
6.0000 mg | INTRAMUSCULAR | Status: DC
  Administered 2019-11-03 – 2019-11-05 (×3): 6 mg via INTRAVENOUS
  Filled 2019-11-03: qty 1
  Filled 2019-11-03: qty 0.6
  Filled 2019-11-03: qty 1
  Filled 2019-11-03: qty 0.6

## 2019-11-03 MED ORDER — ONDANSETRON HCL 4 MG/2ML IJ SOLN: 4.0000 mg | Freq: Four times a day (QID) | INTRAMUSCULAR | Status: DC | PRN

## 2019-11-03 MED ORDER — ONDANSETRON HCL 4 MG PO TABS
4.0000 mg | ORAL_TABLET | Freq: Four times a day (QID) | ORAL | Status: DC | PRN
  Filled 2019-11-03: qty 1

## 2019-11-03 MED ORDER — SODIUM CHLORIDE 0.9 % IV SOLN
500.0000 mg | INTRAVENOUS | Status: DC
  Administered 2019-11-03 – 2019-11-04 (×2): 500 mg via INTRAVENOUS
  Filled 2019-11-03 (×3): qty 500

## 2019-11-03 MED ORDER — ALBUTEROL SULFATE (2.5 MG/3ML) 0.083% IN NEBU: 2.5000 mg | INHALATION_SOLUTION | Freq: Four times a day (QID) | RESPIRATORY_TRACT | Status: DC

## 2019-11-03 MED ORDER — HYDROCOD POLST-CPM POLST ER 10-8 MG/5ML PO SUER: 5.0000 mL | Freq: Two times a day (BID) | ORAL | Status: DC | PRN

## 2019-11-03 MED ORDER — SODIUM CHLORIDE 0.9 % IV SOLN
1.0000 g | INTRAVENOUS | Status: DC
  Administered 2019-11-03 – 2019-11-04 (×2): 1 g via INTRAVENOUS
  Filled 2019-11-03: qty 10
  Filled 2019-11-03: qty 1
  Filled 2019-11-03: qty 10

## 2019-11-03 MED ORDER — IOHEXOL 350 MG/ML SOLN
75.0000 mL | Freq: Once | INTRAVENOUS | Status: AC | PRN
  Administered 2019-11-03: 75 mL via INTRAVENOUS

## 2019-11-03 MED ORDER — SODIUM CHLORIDE 0.9 % IV SOLN
200.0000 mg | Freq: Once | INTRAVENOUS | Status: AC
  Administered 2019-11-03: 200 mg via INTRAVENOUS
  Filled 2019-11-03: qty 200

## 2019-11-03 MED ORDER — ACETAMINOPHEN 500 MG PO TABS
1000.0000 mg | ORAL_TABLET | Freq: Once | ORAL | Status: AC
  Administered 2019-11-03: 1000 mg via ORAL
  Filled 2019-11-03: qty 2

## 2019-11-03 MED ORDER — GUAIFENESIN-DM 100-10 MG/5ML PO SYRP
10.0000 mL | ORAL_SOLUTION | ORAL | Status: DC | PRN
  Filled 2019-11-03: qty 10

## 2019-11-03 NOTE — H&P (Signed)
History and Physical   Sandra Wilkins [id number] DOB: Apr 25, 1966 DOA: 11/03/2019  Referring MD/NP/PA: Dr. Cinda Quest  PCP: Birdie Sons, MD   Outpatient Specialists: None  Patient coming from: Home  Chief Complaint: Shortness of breath  HPI: Sandra Wilkins is a 53 y.o. female with medical history significant of GERD, hypertension, morbid obesity, obstructive sleep apnea, depression with anxiety as well as osteoarthritis who was diagnosed with COVID-19 last week on December 15.  Patient got exposed to a staff member at work on December 7 where she was placed on quarantine.  Symptoms started on December 14 when she got tested and came back positive on the 15th.  Since then it has been gradually getting worse with cough fever and now shortness of breath.  She was seen in the ER where her oxygen saturation on room air was about 90% but dropped into the 80s with mobility.  She is also having cough with the shortness of breath.  Patient reported pleuritic chest pain bilaterally.  Patient at this point is being admitted to the hospital with COVID-19 pneumonia with new oxygen requirement.  She denies fever or chills right now.  Denied nausea vomiting or diarrhea...  ED Course: Temperature 102.9 blood pressure 152/89 pulse 110 respirate 25 oxygen sats 92% room air 96% on 2 L.  White count is 4.7 hemoglobin 14.4 and platelets 97.  Sodium 137 potassium 3.2 chloride 102 CO2 22 BUN 18 creatinine 1.04 and calcium 8.0.  Chest x-ray showed multifocal pneumonia mainly in the lower lung zones.  D-dimer is positive.  ALT is 92 and AST 92.  CT angiogram of the chest showed no evidence of pulmonary embolism but multifocal ill-defined foci of pneumonia.  Initiated on treatment and being admitted for further work-up.  Review of Systems: As per HPI otherwise 10 point review of systems negative.    Past Medical History:  Diagnosis Date  . Anxiety   . Arthritis    DDD-NECK  . Depression   . GERD  (gastroesophageal reflux disease)   . Headache    MIGRAINES  . Hypertension    H/O WAS ON FUROSEMIDE IN THE PAST BUT LOST WEIGHT AND WAS TAKEN OFF DUE TO BP CONTROL  . Pre-diabetes   . Sleep apnea    CPAP    Past Surgical History:  Procedure Laterality Date  . BREAST BIOPSY Right 2012   benign  . CHOLECYSTECTOMY  04/17/2017   Procedure: CHOLECYSTECTOMY;  Surgeon: Robert Bellow, MD;  Location: [facility] ORS;  Service: General;;  Laparoscopic converted to open  . ERCP N/A 04/13/2017   Procedure: ENDOSCOPIC RETROGRADE CHOLANGIOPANCREATOGRAPHY (ERCP);  Surgeon: Lucilla Lame, MD;  Location: [facility] ENDOSCOPY;  Service: Endoscopy;  Laterality: N/A;  . ERCP N/A 06/20/2017   Procedure: ENDOSCOPIC RETROGRADE CHOLANGIOPANCREATOGRAPHY (ERCP) STENT REMOVAL;  Surgeon: Lucilla Lame, MD;  Location: [facility] ENDOSCOPY;  Service: Endoscopy;  Laterality: N/A;  . [place]     reports that she quit smoking about 13 years ago. Her smoking use included cigarettes. She has a 10.00 pack-year smoking history. She has never used smokeless tobacco. She reports current alcohol use. She reports that she does not use drugs.  Allergies  Allergen Reactions  . Codeine Hives  . Other Hives and Itching    Metronidazole or Ciprofloxacin    . Sulfa Antibiotics Hives and Itching  . Tramadol Other (See Comments)    Hallucinations  . Penicillins Rash    Has patient had a PCN reaction causing  immediate rash, facial/tongue/throat swelling, SOB or lightheadedness with hypotension: Unknown Has patient had a PCN reaction causing severe rash involving mucus membranes or skin necrosis: Unknown Has patient had a PCN reaction that required hospitalization: Unknown Has patient had a PCN reaction occurring within the last 10 years: No If all of the above answers are "NO", then may proceed with Cephalosporin use.     Family History  Problem Relation Age of Onset  . Hyperlipidemia Father   . Depression  Sister   . Bipolar disorder Brother   . Seizures Brother   . Congestive Heart Failure Maternal Grandfather   . Dementia Paternal Grandfather   . Depression Sister   . Depression Mother   . Arthritis Mother   . Emphysema Mother   . Congestive Heart Failure Mother   . Arthritis Maternal Grandmother   . Asthma Maternal Grandmother   . Breast cancer Neg Hx   . Ovarian cancer Neg Hx   . Colon cancer Neg Hx      Prior to Admission medications   Medication Sig Start Date End Date Taking? Authorizing Provider  amitriptyline (ELAVIL) 10 MG tablet TAKE 1 TABLET BY MOUTH AT BEDTIME FOR 7 DAYS THEN INCREASE TO 2 TABS AT BEDTIME Patient taking differently: Take 20 mg by mouth at bedtime.  07/01/19  Yes Birdie Sons, MD  cetirizine (ZYRTEC) 10 MG tablet Take 10 mg by mouth every morning. NOT TAKING SINCE SHE HAS BEEN SICK WITH GALLBLADDER   Yes [provider]  Ibuprofen 200 MG CAPS Take 400 mg by mouth 2 (two) times daily as needed (PAIN).    Yes [provider]  omeprazole (PRILOSEC) 20 MG capsule Take 1 capsule (20 mg total) by mouth daily. 09/25/19  Yes Bacigalupo, Dionne Bucy, MD  sertraline (ZOLOFT) 100 MG tablet TAKE 1 TABLET BY MOUTH TWICE A DAY Patient taking differently: Take 100 mg by mouth daily.  10/05/19  Yes Birdie Sons, MD    Physical Exam: Vitals:   11/03/19 1851 11/03/19 1900 11/03/19 1901 11/03/19 1915  BP:  (!) 152/89    Pulse:  (!) 107  (!) 110  Resp: (!) 21  (!) 23 (!) 22  Temp:      TempSrc:      SpO2:  96%  96%      Constitutional: Morbidly obese, acutely ill looking Vitals:   11/03/19 1851 11/03/19 1900 11/03/19 1901 11/03/19 1915  BP:  (!) 152/89    Pulse:  (!) 107  (!) 110  Resp: (!) 21  (!) 23 (!) 22  Temp:      TempSrc:      SpO2:  96%  96%   Eyes: PERRL, lids and conjunctivae normal ENMT: Mucous membranes are dry. Posterior pharynx clear of any exudate or lesions.Normal dentition.  Neck: normal, supple, no masses, no  thyromegaly Respiratory: Decreased air entry bilaterally, coarse breath sounds, bilateral rhonchi and mild wheezing, increased respiratory drive no accessory muscle use.  Cardiovascular: Sinus tachycardia, no murmurs / rubs / gallops. No extremity edema. 2+ pedal pulses. No carotid bruits.  Abdomen: no tenderness, no masses palpated. No hepatosplenomegaly. Bowel sounds positive.  Musculoskeletal: no clubbing / cyanosis. No joint deformity upper and lower extremities. Good ROM, no contractures. Normal muscle tone.  Skin: no rashes, lesions, ulcers. No induration Neurologic: CN 2-12 grossly intact. Sensation intact, DTR normal. Strength 5/5 in all 4.  Psychiatric: Normal judgment and insight. Alert and oriented x 3. Normal mood.     Labs  on Admission: I have personally reviewed following labs and imaging studies  CBC: Recent Labs  Lab 11/03/19 1341  WBC 4.7  HGB 14.4  HCT 43.0  MCV 83.7  PLT 97*   Basic Metabolic Panel: Recent Labs  Lab 11/03/19 1341  NA 140  K 3.6  CL 106  CO2 24  GLUCOSE 116*  BUN 17  CREATININE 1.09*  CALCIUM 8.4*   GFR: CrCl cannot be calculated (Unknown ideal weight.). Liver Function Tests: No results for input(s): AST, ALT, ALKPHOS, BILITOT, PROT, ALBUMIN in the last 168 hours. No results for input(s): LIPASE, AMYLASE in the last 168 hours. No results for input(s): AMMONIA in the last 168 hours. Coagulation Profile: No results for input(s): INR, PROTIME in the last 168 hours. Cardiac Enzymes: No results for input(s): CKTOTAL, CKMB, CKMBINDEX, TROPONINI in the last 168 hours. BNP (last 3 results) No results for input(s): PROBNP in the last 8760 hours. HbA1C: No results for input(s): HGBA1C in the last 72 hours. CBG: No results for input(s): GLUCAP in the last 168 hours. Lipid Profile: No results for input(s): CHOL, HDL, LDLCALC, TRIG, CHOLHDL, LDLDIRECT in the last 72 hours. Thyroid Function Tests: No results for input(s): TSH, T4TOTAL,  FREET4, T3FREE, THYROIDAB in the last 72 hours. Anemia Panel: No results for input(s): VITAMINB12, FOLATE, FERRITIN, TIBC, IRON, RETICCTPCT in the last 72 hours. Urine analysis:    Component Value Date/Time   BILIRUBINUR Neg 03/29/2017 1619   PROTEINUR Trace 03/29/2017 1619   UROBILINOGEN 0.2 03/29/2017 1619   NITRITE Neg 03/29/2017 1619   LEUKOCYTESUR Negative 03/29/2017 1619   Sepsis Labs: @LABRCNTIP (procalcitonin:4,lacticidven:4) )No results found for this or any previous visit (from the past 240 hour(s)).   Radiological Exams on Admission: DG Chest Portable 1 View  Result Date: 11/03/2019 CLINICAL DATA:  Left-sided chest pain and shortness of breath. COVID 19 virus infection. EXAM: PORTABLE CHEST 1 VIEW COMPARISON:  None. FINDINGS: Mild cardiomegaly noted. Heterogeneous patchy airspace disease is noted in the mid and lower lung zones bilaterally. No evidence of pleural effusion. IMPRESSION: Heterogeneous patchy airspace disease in mid lower lung zones, suspicious for viral pneumonia. Electronically Signed   By: Marlaine Hind M.D.   On: 11/03/2019 17:58    EKG: Independently reviewed.  It shows sinus tachycardia with a rate of 101, no significant ST changes  Assessment/Plan Principal Problem:   Acute on chronic respiratory failure with hypoxemia (HCC) Active Problems:   Anxiety disorder   Acid reflux   OSA (obstructive sleep apnea)   Nonalcoholic fatty liver disease without nonalcoholic steatohepatitis (NASH)   Pneumonia due to COVID-19 virus     #1 sepsis due to COVID-19 pneumonia: Patient will be admitted.  Aggressively hydrated.  Placed on oxygen and treat for Covid pneumonia with moderate symptoms.  #2 acute respiratory failure with hypoxemia due to pneumonia from COVID-19: Initiate Rocephin and Zithromax, remdesivir although LFTs are mildly elevated, Lovenox, albuterol, zinc may be added as well as dexamethasone.  #3 NASH: Patient is obese with fatty liver.  This may  account for her elevated LFTs.  We will might still be able to give her some remdesivir.  Continue monitoring LFTs.  #4 obstructive sleep apnea: CPAP at night.  #5 GERD: Continue with PPIs.   DVT prophylaxis: Lovenox Code Status: Full code Family Communication: No family at bedside Disposition Plan: Home Consults called: None Admission status: Inpatient telemetry  Severity of Illness: The appropriate patient status for this patient is INPATIENT. Inpatient status is judged to be reasonable  and necessary in order to provide the required intensity of service to ensure the patient's safety. The patient's presenting symptoms, physical exam findings, and initial radiographic and laboratory data in the context of their chronic comorbidities is felt to place them at high risk for further clinical deterioration. Furthermore, it is not anticipated that the patient will be medically stable for discharge from the hospital within 2 midnights of admission. The following factors support the patient status of inpatient.   " The patient's presenting symptoms include shortness of breath and cough. " The worrisome physical exam findings include bilateral rhonchi. " The initial radiographic and laboratory data are worrisome because of multifocal pneumonia on x-ray. " The chronic co-morbidities include hypertension.   * I certify that at the point of admission it is my clinical judgment that the patient will require inpatient hospital care spanning beyond 2 midnights from the point of admission due to high intensity of service, high risk for further deterioration and high frequency of surveillance required.Barbette Merino MD [facility] Pager [phone]  If 7PM-7AM, please contact night-coverage [email or web address] Password [facility]  11/03/2019, 7:34 PM

## 2019-11-03 NOTE — ED Notes (Signed)
Attending Garba notified of pt's fever via direct messaging system. No PRN tylenol or ibuprofen available currently. No new orders.

## 2019-11-03 NOTE — ED Notes (Signed)
Pt given food tray and drink.  

## 2019-11-03 NOTE — ED Triage Notes (Signed)
Pt presents via POV c/o SOB, chest pressyure, and dizziness. Reports + COVID on Tuesday. Ambulatory to triage.

## 2019-11-03 NOTE — ED Notes (Signed)
Pt c/o "CP" and points to L flank and R flank. 60mins later pt points to under her L breast and medial chest stating the pain is "here". States it is a "sharp" pain that started 2 dys ago. States today started having "sharp head pains" too. Pt sitting calmly in chair. Asked to switch to bed so this RN could apply monitor. Denies SOB currently. States has had fevers at times.

## 2019-11-03 NOTE — ED Notes (Signed)
CT staff to bedside.  

## 2019-11-03 NOTE — ED Notes (Signed)
Pt desat to 81% while walking in hallway. Becomes very dyspnic and tachypnic. Placed on 2L via [place].

## 2019-11-03 NOTE — ED Notes (Signed)
Attempted x2 to call lab to inquire about C-reactive protein result. Will try again soon.

## 2019-11-03 NOTE — ED Notes (Signed)
Next trop sent with blue tube.

## 2019-11-03 NOTE — ED Provider Notes (Signed)
[facility] Emergency Department Provider Note   ____________________________________________   First MD Initiated Contact with Patient 11/03/19 1658     (approximate)  I have reviewed the triage vital signs and the nursing notes.   HISTORY  Chief Complaint Shortness of Breath    HPI NIMCO MCHENRY is a 53 y.o. female who was diagnosed with Covid a few days ago.  She is getting shortness of breath now.  She has a fever.  Otherwise she is doing well.  She has a little bit of pain in the middle of her chest and under her ribs worse with deep breathing.         Past Medical History:  Diagnosis Date  . Anxiety   . Arthritis    DDD-NECK  . Depression   . GERD (gastroesophageal reflux disease)   . Headache    MIGRAINES  . Hypertension    H/O WAS ON FUROSEMIDE IN THE PAST BUT LOST WEIGHT AND WAS TAKEN OFF DUE TO BP CONTROL  . Pre-diabetes   . Sleep apnea    CPAP    Patient Active Problem List   Diagnosis Date Noted  . Ocular migraine 05/14/2018  . Plantar fasciitis 04/11/2018  . Diarrhea 04/11/2018  . DDD (degenerative disc disease), cervical 08/22/2016  . Hyperuricemia 07/23/2016  . Arthralgia 07/22/2016  . Irritable bowel syndrome (IBS) 10/21/2015  . 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 Irregular menses 05/21/2015  . Arthritis 04/16/2015  . Clinical depression 04/16/2015  . Acid reflux 04/16/2015  . Lower extremity edema 04/16/2015  . Pre-diabetes 04/16/2015  . OSA (obstructive sleep apnea) 04/16/2015  . Anxiety disorder 09/05/2006    Past Surgical History:  Procedure Laterality Date  . BREAST BIOPSY Right 2012   benign  . CHOLECYSTECTOMY  04/17/2017   Procedure: CHOLECYSTECTOMY;  Surgeon: Robert Bellow, MD;  Location: [facility] ORS;  Service: General;;  Laparoscopic converted to open  . ERCP N/A 04/13/2017   Procedure: ENDOSCOPIC RETROGRADE CHOLANGIOPANCREATOGRAPHY (ERCP);   Surgeon: Lucilla Lame, MD;  Location: [facility] ENDOSCOPY;  Service: Endoscopy;  Laterality: N/A;  . ERCP N/A 06/20/2017   Procedure: ENDOSCOPIC RETROGRADE CHOLANGIOPANCREATOGRAPHY (ERCP) STENT REMOVAL;  Surgeon: Lucilla Lame, MD;  Location: [facility] ENDOSCOPY;  Service: Endoscopy;  Laterality: N/A;  . TONSILLECTOMY AND ADENOIDECTOMY  1972    Prior to Admission medications   Medication Sig Start Date End Date Taking? Authorizing Provider  albuterol (VENTOLIN HFA) 108 (90 Base) MCG/ACT inhaler Inhale 2 puffs into the lungs every 6 (six) hours as needed for wheezing or shortness of breath. 03/17/19   Sharion Balloon, FNP  ALPRAZolam Duanne Moron) 0.5 MG tablet TAKE 1/2 TO 2 TABLETS BY MOUTH EVERY 4 HOURS IF NEEDED FOR PANIC ATTACKS 07/01/19   Birdie Sons, MD  amitriptyline (ELAVIL) 10 MG tablet TAKE 1 TABLET BY MOUTH AT BEDTIME FOR 7 DAYS THEN INCREASE TO 2 TABS AT BEDTIME 07/01/19   Birdie Sons, MD  cetirizine (ZYRTEC) 10 MG tablet Take 10 mg by mouth every morning. NOT TAKING SINCE SHE HAS BEEN SICK WITH GALLBLADDER    [provider]  furosemide (LASIX) 20 MG tablet Take 20 mg by mouth.    [provider]  Ibuprofen 200 MG CAPS Take 400 mg by mouth 2 (two) times daily as needed (PAIN).     [provider]  Multiple Vitamin (MULTIVITAMIN) tablet Take 1 tablet by mouth daily.    [provider]  omeprazole (PRILOSEC) 20  MG capsule Take 1 capsule (20 mg total) by mouth daily. 09/25/19   Virginia Crews, MD  sertraline (ZOLOFT) 100 MG tablet TAKE 1 TABLET BY MOUTH TWICE A DAY 10/05/19   Birdie Sons, MD    Allergies Codeine, Other, Sulfa antibiotics, Tramadol, and Penicillins  Family History  Problem Relation Age of Onset  . Hyperlipidemia Father   . Depression Sister   . Bipolar disorder Brother   . Seizures Brother   . Congestive Heart Failure Maternal Grandfather   . Dementia Paternal Grandfather   . Depression Sister   . Depression Mother   .  Arthritis Mother   . Emphysema Mother   . Congestive Heart Failure Mother   . Arthritis Maternal Grandmother   . Asthma Maternal Grandmother   . Breast cancer Neg Hx   . Ovarian cancer Neg Hx   . Colon cancer Neg Hx     Social History Social History   Tobacco Use  . Smoking status: Former Smoker    Packs/day: 0.50    Years: 20.00    Pack years: 10.00    Types: Cigarettes    Quit date: 11/13/2005    Years since quitting: 13.9  . Smokeless tobacco: Never Used  Substance Use Topics  . Alcohol use: Yes    Comment: occasional   . Drug use: No    Review of Systems  Constitutional: fever/chills Eyes: No visual changes. ENT: No sore throat. Cardiovascular:  chest pain. Respiratory: shortness of breath. Gastrointestinal: No abdominal pain.  No nausea, no vomiting.  No diarrhea.  No constipation. Genitourinary: Negative for dysuria. Musculoskeletal: Negative for back pain. Skin: Negative for rash. Neurological: Negative for headaches, focal weakness    ____________________________________________   PHYSICAL EXAM:  VITAL SIGNS: ED Triage Vitals  Enc Vitals Group     BP 11/03/19 1334 (!) 147/94     Pulse Rate 11/03/19 1334 (!) 106     Resp 11/03/19 1334 16     Temp 11/03/19 1334 99.8 F (37.7 C)     Temp Source 11/03/19 1334 Oral     SpO2 11/03/19 1334 93 %     Weight --      Height --      Head Circumference --      Peak Flow --      Pain Score 11/03/19 1719 3     Pain Loc --      Pain Edu? --      Excl. in [place]? --     Constitutional: Alert and oriented. Well appearing and in no acute distress. Eyes: Conjunctivae are normal. Head: Atraumatic. Nose: No congestion/rhinnorhea. Mouth/Throat: Mucous membranes are moist.  Oropharynx non-erythematous. Neck: No stridor.  Cardiovascular: Normal rate, regular rhythm. Grossly normal heart sounds.  Good peripheral circulation. Respiratory: Normal respiratory effort.  No retractions. Lungs CTAB. Gastrointestinal: Soft  and nontender. No distention. No abdominal bruits. No CVA tenderness. Musculoskeletal: No lower extremity tenderness nor edema.  Neurologic:  Normal speech and language. No gross focal neurologic deficits are appreciated. No gait instability. Skin:  Skin is warm, dry and intact. No rash noted.   ____________________________________________   LABS (all labs ordered are listed, but only abnormal results are displayed)  Labs Reviewed  BASIC METABOLIC PANEL - Abnormal; Notable for the following components:      Result Value   Glucose, Bld 116 (*)    Creatinine, Ser 1.09 (*)    Calcium 8.4 (*)    GFR calc non Af Amer 58 (*)  All other components within normal limits  CBC - Abnormal; Notable for the following components:   RBC 5.14 (*)    Platelets 97 (*)    All other components within normal limits  TROPONIN I (HIGH SENSITIVITY) - Abnormal; Notable for the following components:   Troponin I (High Sensitivity) 22 (*)    All other components within normal limits  TROPONIN I (HIGH SENSITIVITY) - Abnormal; Notable for the following components:   Troponin I (High Sensitivity) 19 (*)    All other components within normal limits  FIBRIN DERIVATIVES D-DIMER ([facility] ONLY)   ____________________________________________  EKG  EKG read interpreted by me shows sinus tachycardia rate of 101 normal axis no acute ST-T changes ____________________________________________  RADIOLOGY  ED MD interpretation: Chest x-ray read by radiology reviewed by me shows patchy disease in the lower mid lung field suspicious for viral pneumonia.  I agree with the reading.  Official radiology report(s): DG Chest Portable 1 View  Result Date: 11/03/2019 CLINICAL DATA:  Left-sided chest pain and shortness of breath. COVID 19 virus infection. EXAM: PORTABLE CHEST 1 VIEW COMPARISON:  None. FINDINGS: Mild cardiomegaly noted. Heterogeneous patchy airspace disease is noted in the mid and lower lung zones bilaterally.  No evidence of pleural effusion. IMPRESSION: Heterogeneous patchy airspace disease in mid lower lung zones, suspicious for viral pneumonia. Electronically Signed   By: Marlaine Hind M.D.   On: 11/03/2019 17:58    ____________________________________________   PROCEDURES  Procedure(s) performed (including Critical Care):  Procedures   ____________________________________________   INITIAL IMPRESSION / ASSESSMENT AND PLAN / ED COURSE  Patient's O2 sats dropped to 81 with ambulation in the room.  She is short of breath.  We will have to get her in on oxygen. Additionally with her pleuritic pain I will get a CT angio and we will get the D-dimer and CRP for the Covid diagnosis         Clinical Course as of Nov 02 1817  Sun Nov 03, 2019  1809 Fibrin derivatives D-Dimer [facility] only) [PM]    Clinical Course User Index [PM] Nena Polio, MD     ____________________________________________   FINAL CLINICAL IMPRESSION(S) / ED DIAGNOSES  Final diagnoses:  COVID-19  Hypoxia  Dyspnea, unspecified type     ED Discharge Orders    None       Note:  This document was prepared using Dragon voice recognition software and may include unintentional dictation errors.    Nena Polio, MD 11/03/19 Tresa Moore

## 2019-11-03 NOTE — ED Notes (Signed)
Pt up to restroom. Will place IV once back to room.

## 2019-11-03 NOTE — ED Notes (Signed)
Room temp dec for pt.

## 2019-11-03 NOTE — ED Notes (Signed)
Attempted x2 to [facility] for c-reactive protein since IV won't pulled back enough blood. Will have 2nd RN try.

## 2019-11-03 NOTE — ED Notes (Signed)
Called lab and requested phlebotomy's assistance to collect labs. Lab staff states they will send someone soon.

## 2019-11-03 NOTE — ED Notes (Signed)
Pt back from imaging

## 2019-11-03 NOTE — Consult Note (Addendum)
Remdesivir - Pharmacy Brief Note   O:  ALT: pending Update @2241 : Alt 99 Chest imaging: Heterogeneous patchy airspace disease in mid lower lung zones, suspicious for viral pneumonia. SpO2: 93 % on Room Air   COVID + 10/29/2019   A/P:  No recent ALT for this admission. Messaged Dr. Jonelle Sidle about obtaining ALT prior to starting remdesivir. Awaiting as response from MD.  Remdesivir 200 mg IVPB once followed by 100 mg IVPB daily x 4 days- once ALT is available.   Kristeen Miss, PharmD Clinical Pharmacist   11/03/2019 7:41 PM

## 2019-11-04 ENCOUNTER — Encounter: Payer: Self-pay | Admitting: Family Medicine

## 2019-11-04 ENCOUNTER — Inpatient Hospital Stay
Admission: AD | Admit: 2019-11-04 | Payer: BLUE CROSS/BLUE SHIELD | Source: Other Acute Inpatient Hospital | Admitting: Family Medicine

## 2019-11-04 DIAGNOSIS — U071 COVID-19: Principal | ICD-10-CM

## 2019-11-04 DIAGNOSIS — K760 Fatty (change of) liver, not elsewhere classified: Secondary | ICD-10-CM

## 2019-11-04 DIAGNOSIS — J1289 Other viral pneumonia: Secondary | ICD-10-CM

## 2019-11-04 DIAGNOSIS — G4733 Obstructive sleep apnea (adult) (pediatric): Secondary | ICD-10-CM

## 2019-11-04 HISTORY — DX: COVID-19: U07.1

## 2019-11-04 LAB — COMPREHENSIVE METABOLIC PANEL
ALT: 100 U/L — ABNORMAL HIGH (ref 0–44)
AST: 86 U/L — ABNORMAL HIGH (ref 15–41)
Albumin: 3.5 g/dL (ref 3.5–5.0)
Alkaline Phosphatase: 80 U/L (ref 38–126)
Anion gap: 9 (ref 5–15)
BUN: 18 mg/dL (ref 6–20)
CO2: 27 mmol/L (ref 22–32)
Calcium: 8.1 mg/dL — ABNORMAL LOW (ref 8.9–10.3)
Chloride: 106 mmol/L (ref 98–111)
Creatinine, Ser: 1.02 mg/dL — ABNORMAL HIGH (ref 0.44–1.00)
GFR calc Af Amer: >60 mL/min (ref >60)
GFR calc non Af Amer: >60 mL/min (ref >60)
Glucose, Bld: 150 mg/dL — ABNORMAL HIGH (ref 70–99)
Potassium: 3.7 mmol/L (ref 3.5–5.1)
Sodium: 142 mmol/L (ref 135–145)
Total Bilirubin: 0.7 mg/dL (ref 0.3–1.2)
Total Protein: 7 g/dL (ref 6.5–8.1)

## 2019-11-04 LAB — CBC WITH DIFFERENTIAL/PLATELET
Abs Immature Granulocytes: 0.01 10*3/uL (ref 0.00–0.07)
Basophils Absolute: 0 10*3/uL (ref 0.0–0.1)
Basophils Relative: 0 %
Eosinophils Absolute: 0 10*3/uL (ref 0.0–0.5)
Eosinophils Relative: 0 %
HCT: 44.9 % (ref 36.0–46.0)
Hemoglobin: 14.3 g/dL (ref 12.0–15.0)
Immature Granulocytes: 0 %
Lymphocytes Relative: 26 %
Lymphs Abs: 0.9 10*3/uL (ref 0.7–4.0)
MCH: 28 pg (ref 26.0–34.0)
MCHC: 31.8 g/dL (ref 30.0–36.0)
MCV: 88 fL (ref 80.0–100.0)
Monocytes Absolute: 0.1 10*3/uL (ref 0.1–1.0)
Monocytes Relative: 4 %
Neutro Abs: 2.4 10*3/uL (ref 1.7–7.7)
Neutrophils Relative %: 70 %
Platelets: 93 10*3/uL — ABNORMAL LOW (ref 150–400)
RBC: 5.1 MIL/uL (ref 3.87–5.11)
RDW: 14.2 % (ref 11.5–15.5)
WBC: 3.5 10*3/uL — ABNORMAL LOW (ref 4.0–10.5)
nRBC: 0 % (ref 0.0–0.2)

## 2019-11-04 LAB — C-REACTIVE PROTEIN
CRP: 3.2 mg/dL — ABNORMAL HIGH (ref <1.0)
CRP: 3.9 mg/dL — ABNORMAL HIGH (ref <1.0)

## 2019-11-04 LAB — HIV ANTIBODY (ROUTINE TESTING W REFLEX): HIV Screen 4th Generation wRfx: NONREACTIVE

## 2019-11-04 LAB — FERRITIN: Ferritin: 256 ng/mL (ref 11–307)

## 2019-11-04 LAB — PROCALCITONIN: Procalcitonin: <0.1 ng/mL

## 2019-11-04 LAB — FIBRIN DERIVATIVES D-DIMER (ARMC ONLY): Fibrin derivatives D-dimer (ARMC): 491.21 ng/mL (FEU) (ref 0.00–499.00)

## 2019-11-04 MED ORDER — ALPRAZOLAM 0.5 MG PO TABS
0.5000 mg | ORAL_TABLET | Freq: Two times a day (BID) | ORAL | Status: DC | PRN
  Administered 2019-11-04: 12:00:00 0.5 mg via ORAL
  Filled 2019-11-04: qty 2

## 2019-11-04 MED ORDER — SERTRALINE HCL 50 MG PO TABS
100.0000 mg | ORAL_TABLET | Freq: Every day | ORAL | Status: DC
  Administered 2019-11-04 – 2019-11-05 (×2): 100 mg via ORAL
  Filled 2019-11-04: qty 2
  Filled 2019-11-04: qty 1

## 2019-11-04 NOTE — Progress Notes (Signed)
PROGRESS NOTE    AMERI GARCIARAMIREZ  [id number] DOB: Nov 14, 1966 DOA: 11/03/2019 PCP: Birdie Sons, MD      Brief Narrative:  Mrs. Colombe is a 53 y.o. F with obesity, HTN, gallstone pancreatitis, OSA who presented with several days fever, now progress cough and SOB.  Exposed to a staff member at work on December 7, and placed on quarantine.  Symptoms started on December 14 when she got tested and came back positive on the 15th (see [facility]).  Since then, progressive cough, SOB, pleuritic pain, fever, chills.  In the ER SpO2 90%, dropped to 80s with mobility.  HR 110, RR 25.  CXR with multifocal pneumonia.  CTA no PE.        Assessment & Plan:  Coronavirus pneumonitis with acute hypoxic respiratory failure Prsented with 1 week progressive cough, dyspnea and now multifocal infiltrates and hypoxia in the setting of the ongoing 2020 COVID-19 pandemic.  -Continue redmesivir day 2 of 5 -Continue dexamethasone day 2 -VTE PPx with Lovenox   Possible bacterial pneumonia -Continue CTX, azithro for now -Obtain procalcitonins, if stably low, will stop Abx tomorrow  Hypertension Not on home meds.  BP normal  BMI 53  Anxiety -Continue sertraline -Continue Xanax PRN  Thrombocytopenia Platelets <100K. -Trend on Lovenox        MDM and disposition: The below labs and imaging reports were reviewed and summarized above.  Medication management as above.  The patient was admitted with COVID-19 and hypoxia.        DVT prophylaxis: Lovenox Code Status: FULL Family Communication:      Antimicrobials:   CTX and azithro 12/20 >>       Subjective: Feeling fine.  Mild chest heaviness, weakness, fatigue.  Cough, headache improved.  No vomiting, confusion.    Objective: Vitals:   11/03/19 2100 11/03/19 2115 11/03/19 2152 11/04/19 0428  BP:   124/79 127/80  Pulse: (!) 103 (!) 102 95 90  Resp: (!) 21 17 18 18   Temp:   98.9 F (37.2 C) 98.8  F (37.1 C)  TempSrc:    Oral  SpO2: 95% 94% 96%   Weight:      Height:       No intake or output data in the 24 hours ending 11/04/19 0901 Filed Weights   11/03/19 2030  Weight: 133.8 kg    Examination: General appearance: BMI 12 adult female, alert and in no acute distress.   HEENT: Anicteric, conjunctiva pink, lids and lashes normal. No nasal deformity, discharge, epistaxis.  Lips moist.   Skin: Warm and dry.  No jaundice.  No suspicious rashes or lesions. Cardiac: RRR, nl S1-S2, no murmurs appreciated.  Capillary refill is brisk.  JVP not visible.  No LE edema.  Radial pulses 2+ and symmetric. Respiratory: Tachypneic, no rales or wheezing.    Abdomen: Abdomen soft.  No TTP. No ascites, distension, hepatosplenomegaly.   MSK: No deformities or effusions. Neuro: Awake and alert.  EOMI, moves all extremities. Speech fluent.    Psych: Sensorium intact and responding to questions, attention normal. Affect normal.  Judgment and insight appear normal.          Data Reviewed: I have personally reviewed following labs and imaging studies:  CBC: Recent Labs  Lab 11/03/19 1341 11/04/19 0507  WBC 4.7 3.5*  NEUTROABS  --  2.4  HGB 14.4 14.3  HCT 43.0 44.9  MCV 83.7 88.0  PLT 97* 93*   Basic Metabolic Panel:  Recent Labs  Lab 11/03/19 1341 11/03/19 2124 11/04/19 0507  NA 140 137 142  K 3.6 3.2* 3.7  CL 106 102 106  CO2 24 22 27   GLUCOSE 116* 114* 150*  BUN 17 18 18   CREATININE 1.09* 1.04* 1.02*  CALCIUM 8.4* 8.0* 8.1*   GFR: Estimated Creatinine Clearance: 85.1 mL/min (A) (by C-G formula based on SCr of 1.02 mg/dL (H)). Liver Function Tests: Recent Labs  Lab 11/03/19 2124 11/04/19 0507  AST 92* 86*  ALT 99* 100*  ALKPHOS 82 80  BILITOT 0.7 0.7  PROT 7.1 7.0  ALBUMIN 3.7 3.5   No results for input(s): LIPASE, AMYLASE in the last 168 hours. No results for input(s): AMMONIA in the last 168 hours. Coagulation Profile: No results for input(s): INR, PROTIME  in the last 168 hours. Cardiac Enzymes: No results for input(s): CKTOTAL, CKMB, CKMBINDEX, TROPONINI in the last 168 hours. BNP (last 3 results) No results for input(s): PROBNP in the last 8760 hours. HbA1C: No results for input(s): HGBA1C in the last 72 hours. CBG: No results for input(s): GLUCAP in the last 168 hours. Lipid Profile: No results for input(s): CHOL, HDL, LDLCALC, TRIG, CHOLHDL, LDLDIRECT in the last 72 hours. Thyroid Function Tests: No results for input(s): TSH, T4TOTAL, FREET4, T3FREE, THYROIDAB in the last 72 hours. Anemia Panel: Recent Labs    11/04/19 0507  FERRITIN 256   Urine analysis:    Component Value Date/Time   BILIRUBINUR Neg 03/29/2017 1619   PROTEINUR Trace 03/29/2017 1619   UROBILINOGEN 0.2 03/29/2017 1619   NITRITE Neg 03/29/2017 1619   LEUKOCYTESUR Negative 03/29/2017 1619   Sepsis Labs: @LABRCNTIP (procalcitonin:4,lacticacidven:4)  )No results found for this or any previous visit (from the past 240 hour(s)).       Radiology Studies: CT Angio Chest PE W and/or Wo Contrast  Result Date: 11/03/2019 CLINICAL DATA:  Pleuritic chest pain. Shortness of breath. Hypoxia. COVID 19 virus infection. EXAM: CT ANGIOGRAPHY CHEST WITH CONTRAST TECHNIQUE: Multidetector CT imaging of the chest was performed using the standard protocol during bolus administration of intravenous contrast. Multiplanar CT image reconstructions and MIPs were obtained to evaluate the vascular anatomy. CONTRAST:  150 mL OMNIPAQUE IOHEXOL 350 MG/ML SOLN COMPARISON:  None. FINDINGS: Cardiovascular: Satisfactory opacification of pulmonary arteries noted, and no pulmonary emboli identified. No evidence of thoracic aortic dissection or aneurysm. Mediastinum/Nodes: No masses or pathologically enlarged lymph nodes identified. Lungs/Pleura: Multifocal ill-defined areas of consolidation are seen throughout both lungs, suspicious for atypical/viral pneumonia. No evidence of pleural effusion.  Upper abdomen: Severe hepatic steatosis. Musculoskeletal: No suspicious bone lesions identified. Review of the MIP images confirms the above findings. IMPRESSION: 1. No evidence of pulmonary embolism. 2. Multifocal ill-defined areas of consolidation throughout both lungs, suspicious for atypical/viral pneumonia. 3. Severe hepatic steatosis. Electronically Signed   By: Marlaine Hind M.D.   On: 11/03/2019 19:54   DG Chest Portable 1 View  Result Date: 11/03/2019 CLINICAL DATA:  Left-sided chest pain and shortness of breath. COVID 19 virus infection. EXAM: PORTABLE CHEST 1 VIEW COMPARISON:  None. FINDINGS: Mild cardiomegaly noted. Heterogeneous patchy airspace disease is noted in the mid and lower lung zones bilaterally. No evidence of pleural effusion. IMPRESSION: Heterogeneous patchy airspace disease in mid lower lung zones, suspicious for viral pneumonia. Electronically Signed   By: Marlaine Hind M.D.   On: 11/03/2019 17:58        Scheduled Meds: . albuterol  2.5 mg Inhalation Q6H  . dexamethasone (DECADRON) injection  6 mg Intravenous  Q24H  . enoxaparin (LOVENOX) injection  40 mg Subcutaneous Q24H   Continuous Infusions: . azithromycin Stopped (11/03/19 2313)  . cefTRIAXone (ROCEPHIN)  IV Stopped (11/03/19 2149)  . remdesivir 100 mg in NS 100 mL       LOS: 1 day    Time spent: 25 minutes      Edwin Dada, MD [facility] 11/04/2019, 9:01 AM     Please page through [employer]:  [email or web address] Contact charge nurse for password If 7PM-7AM, please contact night-coverage

## 2019-11-05 ENCOUNTER — Encounter: Payer: Self-pay | Admitting: Family Medicine

## 2019-11-05 LAB — COMPREHENSIVE METABOLIC PANEL
ALT: 93 U/L — ABNORMAL HIGH (ref 0–44)
AST: 76 U/L — ABNORMAL HIGH (ref 15–41)
Albumin: 3.3 g/dL — ABNORMAL LOW (ref 3.5–5.0)
Alkaline Phosphatase: 73 U/L (ref 38–126)
Anion gap: 10 (ref 5–15)
BUN: 19 mg/dL (ref 6–20)
CO2: 24 mmol/L (ref 22–32)
Calcium: 8 mg/dL — ABNORMAL LOW (ref 8.9–10.3)
Chloride: 108 mmol/L (ref 98–111)
Creatinine, Ser: 0.91 mg/dL (ref 0.44–1.00)
GFR calc Af Amer: >60 mL/min (ref >60)
GFR calc non Af Amer: >60 mL/min (ref >60)
Glucose, Bld: 165 mg/dL — ABNORMAL HIGH (ref 70–99)
Potassium: 3.6 mmol/L (ref 3.5–5.1)
Sodium: 142 mmol/L (ref 135–145)
Total Bilirubin: 0.5 mg/dL (ref 0.3–1.2)
Total Protein: 6.7 g/dL (ref 6.5–8.1)

## 2019-11-05 LAB — CBC WITH DIFFERENTIAL/PLATELET
Abs Immature Granulocytes: 0.02 10*3/uL (ref 0.00–0.07)
Basophils Absolute: 0 10*3/uL (ref 0.0–0.1)
Basophils Relative: 0 %
Eosinophils Absolute: 0 10*3/uL (ref 0.0–0.5)
Eosinophils Relative: 0 %
HCT: 42.7 % (ref 36.0–46.0)
Hemoglobin: 13.9 g/dL (ref 12.0–15.0)
Immature Granulocytes: 1 %
Lymphocytes Relative: 24 %
Lymphs Abs: 0.9 10*3/uL (ref 0.7–4.0)
MCH: 28.7 pg (ref 26.0–34.0)
MCHC: 32.6 g/dL (ref 30.0–36.0)
MCV: 88.2 fL (ref 80.0–100.0)
Monocytes Absolute: 0.2 10*3/uL (ref 0.1–1.0)
Monocytes Relative: 5 %
Neutro Abs: 2.7 10*3/uL (ref 1.7–7.7)
Neutrophils Relative %: 70 %
Platelets: 107 10*3/uL — ABNORMAL LOW (ref 150–400)
RBC: 4.84 MIL/uL (ref 3.87–5.11)
RDW: 14.5 % (ref 11.5–15.5)
WBC: 3.8 10*3/uL — ABNORMAL LOW (ref 4.0–10.5)
nRBC: 0 % (ref 0.0–0.2)

## 2019-11-05 LAB — FERRITIN: Ferritin: 268 ng/mL (ref 11–307)

## 2019-11-05 LAB — FIBRIN DERIVATIVES D-DIMER (ARMC ONLY): Fibrin derivatives D-dimer (ARMC): 488.21 ng/mL (FEU) (ref 0.00–499.00)

## 2019-11-05 LAB — PROCALCITONIN: Procalcitonin: <0.1 ng/mL

## 2019-11-05 LAB — C-REACTIVE PROTEIN: CRP: 2.7 mg/dL — ABNORMAL HIGH (ref <1.0)

## 2019-11-05 LAB — GLUCOSE, CAPILLARY: Glucose-Capillary: 94 mg/dL (ref 70–99)

## 2019-11-05 MED ORDER — INFLUENZA VAC SPLIT QUAD 0.5 ML IM SUSY: 0.5000 mL | PREFILLED_SYRINGE | INTRAMUSCULAR | Status: DC

## 2019-11-05 MED ORDER — NON FORMULARY: 3.0000 mg | Freq: Every evening | Status: DC | PRN

## 2019-11-05 MED ORDER — ALBUTEROL SULFATE HFA 108 (90 BASE) MCG/ACT IN AERS
2.0000 | INHALATION_SPRAY | Freq: Four times a day (QID) | RESPIRATORY_TRACT | Status: DC | PRN
  Filled 2019-11-05: qty 6.7

## 2019-11-05 MED ORDER — MELATONIN 5 MG PO TABS
5.0000 mg | ORAL_TABLET | Freq: Every evening | ORAL | Status: DC | PRN
  Administered 2019-11-05: 5 mg via ORAL
  Filled 2019-11-05 (×3): qty 1

## 2019-11-05 NOTE — ED Notes (Signed)
Pt given lunch tray.

## 2019-11-05 NOTE — ED Notes (Signed)
ED TO INPATIENT HANDOFF REPORT  ED Nurse Name and Phone #:  Dorothyann Gibbs [phone]  S Name/Age/Gender Sandra Wilkins 53 y.o. female Room/Bed: ED36A/ED36A  Code Status   Code Status: Full Code  Home/SNF/Other Home Patient oriented to: self, place, time and situation Is this baseline? Yes   Triage Complete: Triage complete  Chief Complaint Acute on chronic respiratory failure with hypoxemia ([place]) [J96.21] COVID-19 [U07.1]  Triage Note Pt presents via POV c/o SOB, chest pressyure, and dizziness. Reports + COVID on Tuesday. Ambulatory to triage.     Allergies Allergies  Allergen Reactions  . Codeine Hives  . Other Hives and Itching    Metronidazole or Ciprofloxacin    . Sulfa Antibiotics Hives and Itching  . Tramadol Other (See Comments)    Hallucinations  . Penicillins Rash    Has patient had a PCN reaction causing immediate rash, facial/tongue/throat swelling, SOB or lightheadedness with hypotension: Unknown Has patient had a PCN reaction causing severe rash involving mucus membranes or skin necrosis: Unknown Has patient had a PCN reaction that required hospitalization: Unknown Has patient had a PCN reaction occurring within the last 10 years: No If all of the above answers are "NO", then may proceed with Cephalosporin use.     Level of Care/Admitting Diagnosis ED Disposition    ED Disposition Condition Comment   Admit  Hospital Area: [place] [[id number]]  Level of Care: Med-Surg [16]  Covid Evaluation: Confirmed COVID Positive  Diagnosis: COVID-19 [id number]  Admitting Physician: Edwin Dada [id number]  Attending Physician: Edwin Dada [id number]  Estimated length of stay: past midnight tomorrow  Certification:: I certify this patient will need inpatient services for at least 2 midnights       B Medical/Surgery History Past Medical History:  Diagnosis Date  . Anxiety   . Arthritis    DDD-NECK  . Depression   .  GERD (gastroesophageal reflux disease)   . Headache    MIGRAINES  . Hypertension    H/O WAS ON FUROSEMIDE IN THE PAST BUT LOST WEIGHT AND WAS TAKEN OFF DUE TO BP CONTROL  . Pre-diabetes   . Sleep apnea    CPAP   Past Surgical History:  Procedure Laterality Date  . BREAST BIOPSY Right 2012   benign  . CHOLECYSTECTOMY  04/17/2017   Procedure: CHOLECYSTECTOMY;  Surgeon: Robert Bellow, MD;  Location: [facility] ORS;  Service: General;;  Laparoscopic converted to open  . ERCP N/A 04/13/2017   Procedure: ENDOSCOPIC RETROGRADE CHOLANGIOPANCREATOGRAPHY (ERCP);  Surgeon: Lucilla Lame, MD;  Location: [facility] ENDOSCOPY;  Service: Endoscopy;  Laterality: N/A;  . ERCP N/A 06/20/2017   Procedure: ENDOSCOPIC RETROGRADE CHOLANGIOPANCREATOGRAPHY (ERCP) STENT REMOVAL;  Surgeon: Lucilla Lame, MD;  Location: [facility] ENDOSCOPY;  Service: Endoscopy;  Laterality: N/A;  . TONSILLECTOMY AND ADENOIDECTOMY  1972     A IV Location/Drains/Wounds Patient Lines/Drains/Airways Status   Active Line/Drains/Airways    Name:   Placement date:   Placement time:   Site:   Days:   Peripheral IV 11/03/19 Left Antecubital   11/03/19    1758    Antecubital   2   Incision (Closed) 04/17/17 Abdomen   04/17/17    1404     932   Incision - 1 Port Abdomen Right;Lateral   04/17/17    1359     932   Incision - 2 Ports Abdomen Right;Upper Umbilicus   0000000    [id number]     932  Intake/Output Last 24 hours  Intake/Output Summary (Last 24 hours) at 11/05/2019 1506 Last data filed at 11/04/2019 2224 Gross per 24 hour  Intake 590 ml  Output --  Net 590 ml    Labs/Imaging Results for orders placed or performed during the hospital encounter of 11/03/19 (from the past 48 hour(s))  Troponin I (High Sensitivity)     Status: Abnormal   Collection Time: 11/03/19  4:25 PM  Result Value Ref Range   Troponin I (High Sensitivity) 19 (H) <18 ng/L    Comment: (NOTE) Elevated high sensitivity troponin I (hsTnI) values and significant   changes across serial measurements may suggest ACS but many other  chronic and acute conditions are known to elevate hsTnI results.  Refer to the "Links" section for chest pain algorithms and additional  guidance. Performed at [facility], [address] [place], [place] [address]   Fibrin derivatives D-Dimer [facility] only)     Status: Abnormal   Collection Time: 11/03/19  6:09 PM  Result Value Ref Range   Fibrin derivatives D-dimer ([facility]) 561.12 (H) 0.00 - 499.00 ng/mL (FEU)    Comment: (NOTE) <> Exclusion of Venous Thromboembolism (VTE) - OUTPATIENT ONLY   (Emergency Department or [place])   0-499 ng/ml (FEU): With a low to intermediate pretest probability                      for VTE this test result excludes the diagnosis                      of VTE.   >499 ng/ml (FEU) : VTE not excluded; additional work up for VTE is                      required. <> Testing on Inpatients and Evaluation of Disseminated Intravascular   Coagulation (DIC) Reference Range:   0-499 ng/ml (FEU) Performed at [facility], [address] [place], [place] [address]   C-reactive protein     Status: Abnormal   Collection Time: 11/03/19  9:24 PM  Result Value Ref Range   CRP 3.2 (H) <1.0 mg/dL    Comment: Performed at [facility], [place] [address], [place], [place] [address]  HIV Antibody (routine testing w rflx)     Status: None   Collection Time: 11/03/19  9:24 PM  Result Value Ref Range   HIV Screen 4th Generation wRfx NON REACTIVE NON REACTIVE    Comment: Performed at [facility], [place] [address], [place], [place] [address]  Comprehensive metabolic panel     Status: Abnormal   Collection Time: 11/03/19  9:24 PM  Result Value Ref Range   Sodium 137 135 - 145 mmol/L   Potassium 3.2 (L) 3.5 - 5.1 mmol/L   Chloride 102 98 - 111 mmol/L   CO2 22 22 - 32 mmol/L   Glucose, Bld 114 (H) 70 - 99 mg/dL   BUN 18 6 - 20 mg/dL   Creatinine, Ser 1.04 (H) 0.44 - 1.00 mg/dL    Calcium 8.0 (L) 8.9 - 10.3 mg/dL   Total Protein 7.1 6.5 - 8.1 g/dL   Albumin 3.7 3.5 - 5.0 g/dL   AST 92 (H) 15 - 41 U/L   ALT 99 (H) 0 - 44 U/L   Alkaline Phosphatase 82 38 - 126 U/L   Total Bilirubin 0.7 0.3 - 1.2 mg/dL   GFR calc non Af Amer >60 >  60 mL/min   GFR calc Af Amer >60 >60 mL/min   Anion gap 13 5 - 15    Comment: Performed at [facility], [address] [place], [place] [address]  CBC with Differential/Platelet     Status: Abnormal   Collection Time: 11/04/19  5:07 AM  Result Value Ref Range   WBC 3.5 (L) 4.0 - 10.5 K/uL   RBC 5.10 3.87 - 5.11 MIL/uL   Hemoglobin 14.3 12.0 - 15.0 g/dL   HCT 44.9 36.0 - 46.0 %   MCV 88.0 80.0 - 100.0 fL   MCH 28.0 26.0 - 34.0 pg   MCHC 31.8 30.0 - 36.0 g/dL   RDW 14.2 11.5 - 15.5 %   Platelets 93 (L) 150 - 400 K/uL    Comment: Immature Platelet Fraction may be clinically indicated, consider ordering this additional test [id number]    nRBC 0.0 0.0 - 0.2 %   Neutrophils Relative % 70 %   Neutro Abs 2.4 1.7 - 7.7 K/uL   Lymphocytes Relative 26 %   Lymphs Abs 0.9 0.7 - 4.0 K/uL   Monocytes Relative 4 %   Monocytes Absolute 0.1 0.1 - 1.0 K/uL   Eosinophils Relative 0 %   Eosinophils Absolute 0.0 0.0 - 0.5 K/uL   Basophils Relative 0 %   Basophils Absolute 0.0 0.0 - 0.1 K/uL   Immature Granulocytes 0 %   Abs Immature Granulocytes 0.01 0.00 - 0.07 K/uL    Comment: Performed at [facility], [address] [place], [place] [address]  Comprehensive metabolic panel     Status: Abnormal   Collection Time: 11/04/19  5:07 AM  Result Value Ref Range   Sodium 142 135 - 145 mmol/L   Potassium 3.7 3.5 - 5.1 mmol/L   Chloride 106 98 - 111 mmol/L   CO2 27 22 - 32 mmol/L   Glucose, Bld 150 (H) 70 - 99 mg/dL   BUN 18 6 - 20 mg/dL   Creatinine, Ser 1.02 (H) 0.44 - 1.00 mg/dL   Calcium 8.1 (L) 8.9 - 10.3 mg/dL   Total Protein 7.0 6.5 - 8.1 g/dL   Albumin 3.5 3.5 - 5.0 g/dL   AST 86 (H) 15 - 41 U/L   ALT 100 (H) 0 -  44 U/L   Alkaline Phosphatase 80 38 - 126 U/L   Total Bilirubin 0.7 0.3 - 1.2 mg/dL   GFR calc non Af Amer >60 >60 mL/min   GFR calc Af Amer >60 >60 mL/min   Anion gap 9 5 - 15    Comment: Performed at [facility], [address] [place], [place] [address]  C-reactive protein     Status: Abnormal   Collection Time: 11/04/19  5:07 AM  Result Value Ref Range   CRP 3.9 (H) <1.0 mg/dL    Comment: Performed at [facility], [place] [address], [place],  [address]  Fibrin derivatives D-Dimer [facility] only)     Status: None   Collection Time: 11/04/19  5:07 AM  Result Value Ref Range   Fibrin derivatives D-dimer ([facility]) 491.21 0.00 - 499.00 ng/mL (FEU)    Comment: (NOTE) <> Exclusion of Venous Thromboembolism (VTE) - OUTPATIENT ONLY   (Emergency Department or [place])   0-499 ng/ml (FEU): With a low to intermediate pretest probability                      for VTE this test result excludes the diagnosis  of VTE.   >499 ng/ml (FEU) : VTE not excluded; additional work up for VTE is                      required. <> Testing on Inpatients and Evaluation of Disseminated Intravascular   Coagulation (DIC) Reference Range:   0-499 ng/ml (FEU) Performed at [facility], [address] [place], [place] [address]   Ferritin     Status: None   Collection Time: 11/04/19  5:07 AM  Result Value Ref Range   Ferritin 256 11 - 307 ng/mL    Comment: Performed at [facility], [address] [place], [place] [address]  Procalcitonin - Baseline     Status: None   Collection Time: 11/04/19  9:00 AM  Result Value Ref Range   Procalcitonin <0.10 ng/mL    Comment:        Interpretation: PCT (Procalcitonin) <= 0.5 ng/mL: Systemic infection (sepsis) is not likely. Local bacterial infection is possible. (NOTE)       Sepsis PCT Algorithm           Lower Respiratory Tract                                      Infection PCT Algorithm     ----------------------------     ----------------------------         PCT < 0.25 ng/mL                PCT < 0.10 ng/mL         Strongly encourage             Strongly discourage   discontinuation of antibiotics    initiation of antibiotics    ----------------------------     -----------------------------       PCT 0.25 - 0.50 ng/mL            PCT 0.10 - 0.25 ng/mL               OR       >80% decrease in PCT            Discourage initiation of                                            antibiotics      Encourage discontinuation           of antibiotics    ----------------------------     -----------------------------         PCT >= 0.50 ng/mL              PCT 0.26 - 0.50 ng/mL               AND        <80% decrease in PCT             Encourage initiation of                                             antibiotics       Encourage continuation           of antibiotics    ----------------------------     -----------------------------  PCT >= 0.50 ng/mL                  PCT > 0.50 ng/mL               AND         increase in PCT                  Strongly encourage                                      initiation of antibiotics    Strongly encourage escalation           of antibiotics                                     -----------------------------                                           PCT <= 0.25 ng/mL                                                 OR                                        > 80% decrease in PCT                                     Discontinue / Do not initiate                                             antibiotics Performed at [facility], [address] [place], [place] [address]   CBC with Differential/Platelet     Status: Abnormal   Collection Time: 11/05/19  5:49 AM  Result Value Ref Range   WBC 3.8 (L) 4.0 - 10.5 K/uL   RBC 4.84 3.87 - 5.11 MIL/uL   Hemoglobin 13.9 12.0 - 15.0 g/dL   HCT 42.7 36.0 - 46.0 %   MCV 88.2 80.0 - 100.0 fL    MCH 28.7 26.0 - 34.0 pg   MCHC 32.6 30.0 - 36.0 g/dL   RDW 14.5 11.5 - 15.5 %   Platelets 107 (L) 150 - 400 K/uL    Comment: Immature Platelet Fraction may be clinically indicated, consider ordering this additional test [id number]    nRBC 0.0 0.0 - 0.2 %   Neutrophils Relative % 70 %   Neutro Abs 2.7 1.7 - 7.7 K/uL   Lymphocytes Relative 24 %   Lymphs Abs 0.9 0.7 - 4.0 K/uL   Monocytes Relative 5 %   Monocytes Absolute 0.2 0.1 - 1.0 K/uL   Eosinophils Relative 0 %   Eosinophils Absolute 0.0 0.0 - 0.5 K/uL   Basophils Relative 0 %   Basophils Absolute 0.0 0.0 -  0.1 K/uL   Immature Granulocytes 1 %   Abs Immature Granulocytes 0.02 0.00 - 0.07 K/uL    Comment: Performed at [facility], [address] [place], [place] [address]  Comprehensive metabolic panel     Status: Abnormal   Collection Time: 11/05/19  5:49 AM  Result Value Ref Range   Sodium 142 135 - 145 mmol/L   Potassium 3.6 3.5 - 5.1 mmol/L   Chloride 108 98 - 111 mmol/L   CO2 24 22 - 32 mmol/L   Glucose, Bld 165 (H) 70 - 99 mg/dL   BUN 19 6 - 20 mg/dL   Creatinine, Ser 0.91 0.44 - 1.00 mg/dL   Calcium 8.0 (L) 8.9 - 10.3 mg/dL   Total Protein 6.7 6.5 - 8.1 g/dL   Albumin 3.3 (L) 3.5 - 5.0 g/dL   AST 76 (H) 15 - 41 U/L   ALT 93 (H) 0 - 44 U/L   Alkaline Phosphatase 73 38 - 126 U/L   Total Bilirubin 0.5 0.3 - 1.2 mg/dL   GFR calc non Af Amer >60 >60 mL/min   GFR calc Af Amer >60 >60 mL/min   Anion gap 10 5 - 15    Comment: Performed at [facility], [address] [place], [place] [address]  C-reactive protein     Status: Abnormal   Collection Time: 11/05/19  5:49 AM  Result Value Ref Range   CRP 2.7 (H) <1.0 mg/dL    Comment: Performed at [facility], [address] [place], [place] [address]  Fibrin derivatives D-Dimer [facility] only)     Status: None   Collection Time: 11/05/19  5:49 AM  Result Value Ref Range   Fibrin derivatives D-dimer ([facility]) 488.21 0.00 - 499.00 ng/mL (FEU)     Comment: (NOTE) <> Exclusion of Venous Thromboembolism (VTE) - OUTPATIENT ONLY   (Emergency Department or [place])   0-499 ng/ml (FEU): With a low to intermediate pretest probability                      for VTE this test result excludes the diagnosis                      of VTE.   >499 ng/ml (FEU) : VTE not excluded; additional work up for VTE is                      required. <> Testing on Inpatients and Evaluation of Disseminated Intravascular   Coagulation (DIC) Reference Range:   0-499 ng/ml (FEU) Performed at [facility], [address] [place], [place] [address]   Ferritin     Status: None   Collection Time: 11/05/19  5:49 AM  Result Value Ref Range   Ferritin 268 11 - 307 ng/mL    Comment: Performed at [facility], [address] [place], [place] [address]  Procalcitonin     Status: None   Collection Time: 11/05/19  5:49 AM  Result Value Ref Range   Procalcitonin <0.10 ng/mL    Comment:        Interpretation: PCT (Procalcitonin) <= 0.5 ng/mL: Systemic infection (sepsis) is not likely. Local bacterial infection is possible. (NOTE)       Sepsis PCT Algorithm           Lower Respiratory Tract  Infection PCT Algorithm    ----------------------------     ----------------------------         PCT < 0.25 ng/mL                PCT < 0.10 ng/mL         Strongly encourage             Strongly discourage   discontinuation of antibiotics    initiation of antibiotics    ----------------------------     -----------------------------       PCT 0.25 - 0.50 ng/mL            PCT 0.10 - 0.25 ng/mL               OR       >80% decrease in PCT            Discourage initiation of                                            antibiotics      Encourage discontinuation           of antibiotics    ----------------------------     -----------------------------         PCT >= 0.50 ng/mL              PCT 0.26 - 0.50 ng/mL               AND         <80% decrease in PCT             Encourage initiation of                                             antibiotics       Encourage continuation           of antibiotics    ----------------------------     -----------------------------        PCT >= 0.50 ng/mL                  PCT > 0.50 ng/mL               AND         increase in PCT                  Strongly encourage                                      initiation of antibiotics    Strongly encourage escalation           of antibiotics                                     -----------------------------                                           PCT <= 0.25 ng/mL  OR                                        > 80% decrease in PCT                                     Discontinue / Do not initiate                                             antibiotics Performed at [facility], [address] [place], [place] [address]    CT Angio Chest PE W and/or Wo Contrast  Result Date: 11/03/2019 CLINICAL DATA:  Pleuritic chest pain. Shortness of breath. Hypoxia. COVID 19 virus infection. EXAM: CT ANGIOGRAPHY CHEST WITH CONTRAST TECHNIQUE: Multidetector CT imaging of the chest was performed using the standard protocol during bolus administration of intravenous contrast. Multiplanar CT image reconstructions and MIPs were obtained to evaluate the vascular anatomy. CONTRAST:  150 mL OMNIPAQUE IOHEXOL 350 MG/ML SOLN COMPARISON:  None. FINDINGS: Cardiovascular: Satisfactory opacification of pulmonary arteries noted, and no pulmonary emboli identified. No evidence of thoracic aortic dissection or aneurysm. Mediastinum/Nodes: No masses or pathologically enlarged lymph nodes identified. Lungs/Pleura: Multifocal ill-defined areas of consolidation are seen throughout both lungs, suspicious for atypical/viral pneumonia. No evidence of pleural effusion. Upper abdomen: Severe hepatic steatosis. Musculoskeletal:  No suspicious bone lesions identified. Review of the MIP images confirms the above findings. IMPRESSION: 1. No evidence of pulmonary embolism. 2. Multifocal ill-defined areas of consolidation throughout both lungs, suspicious for atypical/viral pneumonia. 3. Severe hepatic steatosis. Electronically Signed   By: Marlaine Hind M.D.   On: 11/03/2019 19:54   DG Chest Portable 1 View  Result Date: 11/03/2019 CLINICAL DATA:  Left-sided chest pain and shortness of breath. COVID 19 virus infection. EXAM: PORTABLE CHEST 1 VIEW COMPARISON:  None. FINDINGS: Mild cardiomegaly noted. Heterogeneous patchy airspace disease is noted in the mid and lower lung zones bilaterally. No evidence of pleural effusion. IMPRESSION: Heterogeneous patchy airspace disease in mid lower lung zones, suspicious for viral pneumonia. Electronically Signed   By: Marlaine Hind M.D.   On: 11/03/2019 17:58    Pending Labs Unresulted Labs (From admission, onward)    Start     Ordered   11/10/19 0500  Creatinine, serum  (enoxaparin (LOVENOX)    CrCl >/= 30 ml/min)  Weekly,   STAT    Comments: while on enoxaparin therapy    11/03/19 2016   11/05/19 0500  Procalcitonin  Daily,   STAT     11/04/19 0858   11/04/19 0500  CBC with Differential/Platelet  Daily,   STAT     11/03/19 2016   11/04/19 0500  Comprehensive metabolic panel  Daily,   STAT     11/03/19 2016   11/04/19 0500  C-reactive protein  Daily,   STAT     11/03/19 2016   11/04/19 0500  Fibrin derivatives D-Dimer ([place] only)  Daily,   STAT     11/03/19 2016   11/04/19 0500  Ferritin  Daily,   STAT     11/03/19 2016          Vitals/Pain Today's Vitals   11/04/19 1935 11/04/19 2140 11/05/19 [id number]  11/05/19 0800  BP: 136/74  136/85 126/66  Pulse: 97  74 80  Resp: 17  18 18   Temp: 99.9 F (37.7 C)  97.9 F (36.6 C) 97.8 F (36.6 C)  TempSrc: Oral  Oral Oral  SpO2: 97%  95% 92%  Weight:      Height:      PainSc: 0-No pain 0-No pain 0-No pain 0-No pain     Isolation Precautions Airborne and Contact precautions  Medications Medications  enoxaparin (LOVENOX) injection 40 mg (40 mg Subcutaneous Given 11/04/19 2131)  remdesivir 200 mg in sodium chloride 0.9% 250 mL IVPB (0 mg Intravenous Stopped 11/03/19 2352)    Followed by  remdesivir 100 mg in sodium chloride 0.9 % 100 mL IVPB (0 mg Intravenous Stopped 11/05/19 1103)  albuterol (PROVENTIL) (2.5 MG/3ML) 0.083% nebulizer solution 2.5 mg (2.5 mg Inhalation Not Given 11/05/19 1151)  dexamethasone (DECADRON) injection 6 mg (6 mg Intravenous Given 11/04/19 2131)  guaiFENesin-dextromethorphan (ROBITUSSIN DM) 100-10 MG/5ML syrup 10 mL (has no administration in time range)  chlorpheniramine-HYDROcodone (TUSSIONEX) 10-8 MG/5ML suspension 5 mL (has no administration in time range)  ondansetron (ZOFRAN) tablet 4 mg (has no administration in time range)    Or  ondansetron (ZOFRAN) injection 4 mg (has no administration in time range)  sertraline (ZOLOFT) tablet 100 mg (100 mg Oral Given 11/05/19 1027)  ALPRAZolam (XANAX) tablet 0.5 mg (0.5 mg Oral Given 11/04/19 1152)  iohexol (OMNIPAQUE) 350 MG/ML injection 75 mL (75 mLs Intravenous Contrast Given 11/03/19 1925)  acetaminophen (TYLENOL) tablet 1,000 mg (1,000 mg Oral Given 11/03/19 2039)    Mobility walks Low fall risk   Focused Assessments n/a   R Recommendations: See Admitting Provider Note  Report given to:   Additional Notes: n/a

## 2019-11-05 NOTE — Discharge Instructions (Signed)
You are scheduled for an outpatient infusion of Remdesivir at 230PM on Thursday 12/24.  Please report to [facility] at [address]  Drive to the security guard and tell them you are here for an infusion. They will direct you to the front entrance where we will come and get you.  For questions call [phone].  Thanks

## 2019-11-05 NOTE — Progress Notes (Signed)
Patient scheduled for outpatient Remdesivir infusion at 230PM on Thursday 12/24.  Please advise them to report to [facility] at [address]  Drive to the security guard and tell them you are here for an infusion. They will direct you to the front entrance where we will come and get you.  For questions call [phone].  Thanks

## 2019-11-05 NOTE — Progress Notes (Signed)
PROGRESS NOTE    Sandra Wilkins  [id number] DOB: 19-Jun-1966 DOA: 11/03/2019 PCP: Sandra Sons, MD      Brief Narrative:  Sandra Wilkins is a 53 y.o. F with obesity, HTN, gallstone pancreatitis, OSA who presented with several days fever, now progress cough and SOB.  Exposed to a staff member at work on December 7, and placed on quarantine.  Symptoms started on December 14 when she got tested and came back positive on the 15th (see [facility]).  Since then, progressive cough, SOB, pleuritic pain, fever, chills.  In the ER SpO2 90%, dropped to 80s with mobility.  HR 110, RR 25.  CXR with multifocal pneumonia.  CTA no PE.          Assessment & Plan:  Coronavirus pneumonitis with acute hypoxic respiratory failure Prsented with 1 week progressive cough, dyspnea and now multifocal infiltrates and hypoxia in the setting of the ongoing 2020 COVID-19 pandemic.  Weaned to 0-1L O2 yesterday.  LFTs stable, inflammatory markers low.  -Continue remdesivir day 3 -Continue dex day 3 -Continue VTE PPx with Lovenox   Possible bacterial pneumonia Initial procal low.  I probably have low suspicion for bacterial pneumonia.   -Stop Abx   Hypertension Not on home meds.  BP normal  BMI 53  Anxiety -Continue sertraline -Continue Xanax PRN  Thrombocytopenia Platelets 93K >> 107K today. -Trend on Lovenox        MDM and disposition: The below labs and imaging reports reviewed and summarized above.  Medication management as above.   The patient was admitted with COVID-19 and hypoxia.    She has been weaned from O2.  If she remains off O2 and is improving tomorrow, will plan to discharge home.  I have arranged tentatively for this patient to receive her fifth dose of IV remdesivir then in the outpatient infusion center on Thursday afternoon.      DVT prophylaxis: Lovenox Code Status: FULL Family Communication:      Antimicrobials:   CTX and  azithro 12/20 >> 12/21      Subjective: Chest heaviness and soft shortness of breath is improving.  She is got still some weakness and fatigue, but her cough and headache are improving, she has had no fever, and no syncope or dizziness.  Objective: Vitals:   11/04/19 1935 11/05/19 0603 11/05/19 0800 11/05/19 1500  BP: 136/74 136/85 126/66 123/71  Pulse: 97 74 80 67  Resp: 17 18 18  (!) 22  Temp: 99.9 F (37.7 C) 97.9 F (36.6 C) 97.8 F (36.6 C)   TempSrc: Oral Oral Oral   SpO2: 97% 95% 92% 94%  Weight:      Height:        Intake/Output Summary (Last 24 hours) at 11/05/2019 1555 Last data filed at 11/04/2019 2224 Gross per 24 hour  Intake 590 ml  Output --  Net 590 ml   Filed Weights   11/03/19 2030  Weight: 133.8 kg    Examination: General appearance: Obese adult female, alert and in no acute distress.   HEENT: Anicteric, conjunctiva pink, lids and lashes normal. No nasal deformity, discharge, epistaxis.  Lips moist, teeth normal. OP normal, no oral lesions.   Skin: Warm and dry.  No suspicious rashes or lesions. Cardiac: RRR, no murmurs appreciated.  JVP not visible.  No LE edema.    Respiratory: Normal respiratory rate and rhythm at rest, dyspneic with exertion.  Lung sounds diminished.  CTAB without rales or wheezes.  Abdomen: Abdomen soft.  No tenderness palpation or guarding. No ascites, distension, hepatosplenomegaly.   MSK: No deformities or effusions of the large joints of the upper or lower extremities bilaterally. Neuro: Awake and alert. Naming is grossly intact, and the patient's recall, recent and remote, as well as general fund of knowledge seem within normal limits.  Muscle tone normal, without fasciculations.  Moves all extremities equally and with normal coordination.  Marland Kitchen Speech fluent.    Psych: Sensorium intact and responding to questions, attention normal. Affect normal.  Judgment and insight appear normal.            Data Reviewed: I have  personally reviewed following labs and imaging studies:  CBC: Recent Labs  Lab 11/03/19 1341 11/04/19 0507 11/05/19 0549  WBC 4.7 3.5* 3.8*  NEUTROABS  --  2.4 2.7  HGB 14.4 14.3 13.9  HCT 43.0 44.9 42.7  MCV 83.7 88.0 88.2  PLT 97* 93* [id number]*   Basic Metabolic Panel: Recent Labs  Lab 11/03/19 1341 11/03/19 2124 11/04/19 0507 11/05/19 0549  NA 140 137 142 142  K 3.6 3.2* 3.7 3.6  CL 106 102 106 108  CO2 24 22 27 24   GLUCOSE 116* 114* 150* 165*  BUN 17 18 18 19   CREATININE 1.09* 1.04* 1.02* 0.91  CALCIUM 8.4* 8.0* 8.1* 8.0*   GFR: Estimated Creatinine Clearance: 95.4 mL/min (by C-G formula based on SCr of 0.91 mg/dL). Liver Function Tests: Recent Labs  Lab 11/03/19 2124 11/04/19 0507 11/05/19 0549  AST 92* 86* 76*  ALT 99* 100* 93*  ALKPHOS 82 80 73  BILITOT 0.7 0.7 0.5  PROT 7.1 7.0 6.7  ALBUMIN 3.7 3.5 3.3*   No results for input(s): LIPASE, AMYLASE in the last 168 hours. No results for input(s): AMMONIA in the last 168 hours. Coagulation Profile: No results for input(s): INR, PROTIME in the last 168 hours. Cardiac Enzymes: No results for input(s): CKTOTAL, CKMB, CKMBINDEX, TROPONINI in the last 168 hours. BNP (last 3 results) No results for input(s): PROBNP in the last 8760 hours. HbA1C: No results for input(s): HGBA1C in the last 72 hours. CBG: No results for input(s): GLUCAP in the last 168 hours. Lipid Profile: No results for input(s): CHOL, HDL, LDLCALC, TRIG, CHOLHDL, LDLDIRECT in the last 72 hours. Thyroid Function Tests: No results for input(s): TSH, T4TOTAL, FREET4, T3FREE, THYROIDAB in the last 72 hours. Anemia Panel: Recent Labs    11/04/19 0507 11/05/19 0549  FERRITIN 256 268   Urine analysis:    Component Value Date/Time   BILIRUBINUR Neg 03/29/2017 1619   PROTEINUR Trace 03/29/2017 1619   UROBILINOGEN 0.2 03/29/2017 1619   NITRITE Neg 03/29/2017 1619   LEUKOCYTESUR Negative 03/29/2017 1619   Sepsis  Labs: @LABRCNTIP (procalcitonin:4,lacticacidven:4)  )No results found for this or any previous visit (from the past 240 hour(s)).       Radiology Studies: CT Angio Chest PE W and/or Wo Contrast  Result Date: 11/03/2019 CLINICAL DATA:  Pleuritic chest pain. Shortness of breath. Hypoxia. COVID 19 virus infection. EXAM: CT ANGIOGRAPHY CHEST WITH CONTRAST TECHNIQUE: Multidetector CT imaging of the chest was performed using the standard protocol during bolus administration of intravenous contrast. Multiplanar CT image reconstructions and MIPs were obtained to evaluate the vascular anatomy. CONTRAST:  150 mL OMNIPAQUE IOHEXOL 350 MG/ML SOLN COMPARISON:  None. FINDINGS: Cardiovascular: Satisfactory opacification of pulmonary arteries noted, and no pulmonary emboli identified. No evidence of thoracic aortic dissection or aneurysm. Mediastinum/Nodes: No masses or pathologically enlarged lymph nodes identified. Lungs/Pleura:  Multifocal ill-defined areas of consolidation are seen throughout both lungs, suspicious for atypical/viral pneumonia. No evidence of pleural effusion. Upper abdomen: Severe hepatic steatosis. Musculoskeletal: No suspicious bone lesions identified. Review of the MIP images confirms the above findings. IMPRESSION: 1. No evidence of pulmonary embolism. 2. Multifocal ill-defined areas of consolidation throughout both lungs, suspicious for atypical/viral pneumonia. 3. Severe hepatic steatosis. Electronically Signed   By: Marlaine Hind M.D.   On: 11/03/2019 19:54   DG Chest Portable 1 View  Result Date: 11/03/2019 CLINICAL DATA:  Left-sided chest pain and shortness of breath. COVID 19 virus infection. EXAM: PORTABLE CHEST 1 VIEW COMPARISON:  None. FINDINGS: Mild cardiomegaly noted. Heterogeneous patchy airspace disease is noted in the mid and lower lung zones bilaterally. No evidence of pleural effusion. IMPRESSION: Heterogeneous patchy airspace disease in mid lower lung zones, suspicious for  viral pneumonia. Electronically Signed   By: Marlaine Hind M.D.   On: 11/03/2019 17:58        Scheduled Meds: . albuterol  2.5 mg Inhalation Q6H  . dexamethasone (DECADRON) injection  6 mg Intravenous Q24H  . enoxaparin (LOVENOX) injection  40 mg Subcutaneous Q24H  . sertraline  100 mg Oral Daily   Continuous Infusions: . remdesivir 100 mg in NS 100 mL Stopped (11/05/19 1103)     LOS: 2 days    Time spent: 25 minutes      Edwin Dada, MD [facility] 11/05/2019, 3:55 PM     Please page through [place]:  [email or web address] Contact charge nurse for password If 7PM-7AM, please contact night-coverage

## 2019-11-06 LAB — COMPREHENSIVE METABOLIC PANEL
ALT: 84 U/L — ABNORMAL HIGH (ref 0–44)
AST: 60 U/L — ABNORMAL HIGH (ref 15–41)
Albumin: 3.3 g/dL — ABNORMAL LOW (ref 3.5–5.0)
Alkaline Phosphatase: 77 U/L (ref 38–126)
Anion gap: 10 (ref 5–15)
BUN: 19 mg/dL (ref 6–20)
CO2: 25 mmol/L (ref 22–32)
Calcium: 8.2 mg/dL — ABNORMAL LOW (ref 8.9–10.3)
Chloride: 107 mmol/L (ref 98–111)
Creatinine, Ser: 0.84 mg/dL (ref 0.44–1.00)
GFR calc Af Amer: >60 mL/min (ref >60)
GFR calc non Af Amer: >60 mL/min (ref >60)
Glucose, Bld: 149 mg/dL — ABNORMAL HIGH (ref 70–99)
Potassium: 3.5 mmol/L (ref 3.5–5.1)
Sodium: 142 mmol/L (ref 135–145)
Total Bilirubin: 0.7 mg/dL (ref 0.3–1.2)
Total Protein: 6.5 g/dL (ref 6.5–8.1)

## 2019-11-06 LAB — CBC WITH DIFFERENTIAL/PLATELET
Abs Immature Granulocytes: 0.02 10*3/uL (ref 0.00–0.07)
Basophils Absolute: 0 10*3/uL (ref 0.0–0.1)
Basophils Relative: 0 %
Eosinophils Absolute: 0 10*3/uL (ref 0.0–0.5)
Eosinophils Relative: 0 %
HCT: 43.9 % (ref 36.0–46.0)
Hemoglobin: 14.1 g/dL (ref 12.0–15.0)
Immature Granulocytes: 0 %
Lymphocytes Relative: 21 %
Lymphs Abs: 0.9 10*3/uL (ref 0.7–4.0)
MCH: 28.3 pg (ref 26.0–34.0)
MCHC: 32.1 g/dL (ref 30.0–36.0)
MCV: 88 fL (ref 80.0–100.0)
Monocytes Absolute: 0.2 10*3/uL (ref 0.1–1.0)
Monocytes Relative: 5 %
Neutro Abs: 3.3 10*3/uL (ref 1.7–7.7)
Neutrophils Relative %: 74 %
Platelets: 124 10*3/uL — ABNORMAL LOW (ref 150–400)
RBC: 4.99 MIL/uL (ref 3.87–5.11)
RDW: 14.5 % (ref 11.5–15.5)
WBC: 4.5 10*3/uL (ref 4.0–10.5)
nRBC: 0 % (ref 0.0–0.2)

## 2019-11-06 LAB — FERRITIN: Ferritin: 233 ng/mL (ref 11–307)

## 2019-11-06 LAB — PROCALCITONIN: Procalcitonin: <0.1 ng/mL

## 2019-11-06 LAB — FIBRIN DERIVATIVES D-DIMER (ARMC ONLY): Fibrin derivatives D-dimer (ARMC): 405.73 ng/mL (FEU) (ref 0.00–499.00)

## 2019-11-06 LAB — C-REACTIVE PROTEIN: CRP: 1.8 mg/dL — ABNORMAL HIGH (ref <1.0)

## 2019-11-06 MED ORDER — AMITRIPTYLINE HCL 10 MG PO TABS: 20.0000 mg | ORAL_TABLET | Freq: Every day | ORAL | Status: DC

## 2019-11-06 MED ORDER — SERTRALINE HCL 100 MG PO TABS: 100.0000 mg | ORAL_TABLET | Freq: Every day | ORAL | Status: DC

## 2019-11-06 MED ORDER — INFLUENZA VAC SPLIT QUAD 0.5 ML IM SUSY: 0.5000 mL | PREFILLED_SYRINGE | INTRAMUSCULAR | 0 refills | Status: AC

## 2019-11-06 MED ORDER — GUAIFENESIN-DM 100-10 MG/5ML PO SYRP: 10.0000 mL | ORAL_SOLUTION | ORAL | 0 refills | Status: DC | PRN

## 2019-11-06 NOTE — Progress Notes (Signed)
Sandra Wilkins to be D/C'd Home per MD order.  Discussed prescriptions and follow up appointments with the patient. No changes were made to medications, medication list explained in detail. Pt verbalized understanding. Pt signed COVID package and understands that needs to be on quarantine.  Allergies as of 11/06/2019      Reactions   Codeine Hives   Other Hives, Itching   Metronidazole or Ciprofloxacin    Sulfa Antibiotics Hives, Itching   Tramadol Other (See Comments)   Hallucinations   Penicillins Rash   Has patient had a PCN reaction causing immediate rash, facial/tongue/throat swelling, SOB or lightheadedness with hypotension: Unknown Has patient had a PCN reaction causing severe rash involving mucus membranes or skin necrosis: Unknown Has patient had a PCN reaction that required hospitalization: Unknown Has patient had a PCN reaction occurring within the last 10 years: No If all of the above answers are "NO", then may proceed with Cephalosporin use.      Medication List    TAKE these medications   amitriptyline 10 MG tablet Commonly known as: ELAVIL Take 2 tablets (20 mg total) by mouth at bedtime. What changed: See the new instructions.   cetirizine 10 MG tablet Commonly known as: ZYRTEC Take 10 mg by mouth every morning. NOT TAKING SINCE SHE HAS BEEN SICK WITH GALLBLADDER   guaiFENesin-dextromethorphan 100-10 MG/5ML syrup Commonly known as: ROBITUSSIN DM Take 10 mLs by mouth every 4 (four) hours as needed for cough.   Ibuprofen 200 MG Caps Take 400 mg by mouth 2 (two) times daily as needed (PAIN).   influenza vac split quadrivalent PF 0.5 ML injection Commonly known as: FLUARIX Inject 0.5 mLs into the muscle tomorrow at 10 am for 1 dose.   omeprazole 20 MG capsule Commonly known as: PRILOSEC Take 1 capsule (20 mg total) by mouth daily.   sertraline 100 MG tablet Commonly known as: ZOLOFT Take 1 tablet (100 mg total) by mouth daily.       Vitals:   11/06/19  0509 11/06/19 0853  BP: 138/84 125/80  Pulse: 70 72  Resp: 18 18  Temp: 98 F (36.7 C) 97.7 F (36.5 C)  SpO2: 93% 92%    Tele box removed and returned. Skin clean, dry and intact without evidence of skin break down, no evidence of skin tears noted. IV catheter discontinued intact. Site without signs and symptoms of complications. Dressing and pressure applied. Pt denies pain at this time. No complaints noted.  An After Visit Summary was printed and given to the patient. Patient escorted via [place], and D/C home via private auto.  Rolley Sims

## 2019-11-06 NOTE — Discharge Summary (Signed)
Physician Discharge Summary   Sandra Wilkins  female DOB: [birth year]  [id number]  PCP: Birdie Sons, MD  Admit date: 11/03/2019 Discharge date: 11/06/2019  Admitted From: home Disposition:  home CODE STATUS: Full code  Discharge Instructions    Diet - low sodium heart healthy   Complete by: As directed    Discharge instructions   Complete by: As directed    You are scheduled for outpatient Remdesivir infusion at 2:30PM on Thursday 12/24.  Please report to [facility] at [address].  Drive to the security guard and tell them you are here for an infusion. They will direct you to the front entrance where we will come and get you.  For questions call [phone].   Increase activity slowly   Complete by: As directed        Hospital Course:  For full details, please see H&P, progress notes, consult notes and ancillary notes.  Briefly,  Sandra Wilkins is a 53 y.o. Caucasian F with obesity, HTN, gallstone pancreatitis, OSA who presented with several days fever, now progress cough and SOB.  Exposed to a staff member at work on December 7, and placed on quarantine. Symptoms started on December 14 when she got tested and came back positive for COVID on the 15th (see [facility]).  Since then, progressive cough, SOB, pleuritic pain, fever, chills.  In the ER SpO2 90%, dropped to 80s with mobility.  HR 110, RR 25.  CXR with multifocal pneumonia.  CTA no PE.  # Coronavirus pneumonia with acute hypoxic respiratory failure Presented with 1 week progressive cough, dyspnea and now multifocal infiltrates and hypoxia in the setting of COVID-19 infection.  Pt received 4 days of Remdesivir and steroids while inpatient with improvement in respiratory status.  Pt was on room air on the day of discharge.  Pt will finish her 5th day of Remdesivir at the outpatient clinic (instruction provided in AVS).    Pt was initially started on Ceftriaxone and Azithromycin  on presentation, however, procal was neg, and suspicion for bacterial PNA was low, so abx were discontinued.  # Hx of Hypertension BP mostly normal during hospitalization.  Pt is not on home BP meds.  # BMI 53  # Anxiety Continued home sertraline and Xanax PRN  # Acute Thrombocytopenia, improved Platelets 93K on presentation, was 195 about a month ago.  Thrombocytopenia likely due to acute illness.  Already improved, platelet count up to 124 on the day of discharge.   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)   Pneumonia due to COVID-19 virus   COVID-19    Discharge Instructions:  Allergies as of 11/06/2019      Reactions   Codeine Hives   Other Hives, Itching   Metronidazole or Ciprofloxacin    Sulfa Antibiotics Hives, Itching   Tramadol Other (See Comments)   Hallucinations   Penicillins Rash   Has patient had a PCN reaction causing immediate rash, facial/tongue/throat swelling, SOB or lightheadedness with hypotension: Unknown Has patient had a PCN reaction causing severe rash involving mucus membranes or skin necrosis: Unknown Has patient had a PCN reaction that required hospitalization: Unknown Has patient had a PCN reaction occurring within the last 10 years: No If all of the above answers are "  NO", then may proceed with Cephalosporin use.      Medication List    TAKE these medications   amitriptyline 10 MG tablet Commonly known as: ELAVIL Take 2 tablets (20 mg total) by mouth at bedtime. What changed: See the new instructions.   cetirizine 10 MG tablet Commonly known as: ZYRTEC Take 10 mg by mouth every morning. NOT TAKING SINCE SHE HAS BEEN SICK WITH GALLBLADDER   guaiFENesin-dextromethorphan 100-10 MG/5ML syrup Commonly known as:  ROBITUSSIN DM Take 10 mLs by mouth every 4 (four) hours as needed for cough.   Ibuprofen 200 MG Caps Take 400 mg by mouth 2 (two) times daily as needed (PAIN).   omeprazole 20 MG capsule Commonly known as: PRILOSEC Take 1 capsule (20 mg total) by mouth daily.   sertraline 100 MG tablet Commonly known as: ZOLOFT Take 1 tablet (100 mg total) by mouth daily.     ASK your doctor about these medications   influenza vac split quadrivalent PF 0.5 ML injection Commonly known as: FLUARIX Inject 0.5 mLs into the muscle tomorrow at 10 am for 1 dose. Ask about: Should I take this medication?         Allergies  Allergen Reactions  . Codeine Hives  . Other Hives and Itching    Metronidazole or Ciprofloxacin    . Sulfa Antibiotics Hives and Itching  . Tramadol Other (See Comments)    Hallucinations  . Penicillins Rash    Has patient had a PCN reaction causing immediate rash, facial/tongue/throat swelling, SOB or lightheadedness with hypotension: Unknown Has patient had a PCN reaction causing severe rash involving mucus membranes or skin necrosis: Unknown Has patient had a PCN reaction that required hospitalization: Unknown Has patient had a PCN reaction occurring within the last 10 years: No If all of the above answers are "NO", then may proceed with Cephalosporin use.      The results of significant diagnostics from this hospitalization (including imaging, microbiology, ancillary and laboratory) are listed below for reference.   Consultations:   Procedures/Studies: CT Angio Chest PE W and/or Wo Contrast  Result Date: 11/03/2019 CLINICAL DATA:  Pleuritic chest pain. Shortness of breath. Hypoxia. COVID 19 virus infection. EXAM: CT ANGIOGRAPHY CHEST WITH CONTRAST TECHNIQUE: Multidetector CT imaging of the chest was performed using the standard protocol during bolus administration of intravenous contrast. Multiplanar CT image reconstructions and MIPs were obtained to evaluate the  vascular anatomy. CONTRAST:  150 mL OMNIPAQUE IOHEXOL 350 MG/ML SOLN COMPARISON:  None. FINDINGS: Cardiovascular: Satisfactory opacification of pulmonary arteries noted, and no pulmonary emboli identified. No evidence of thoracic aortic dissection or aneurysm. Mediastinum/Nodes: No masses or pathologically enlarged lymph nodes identified. Lungs/Pleura: Multifocal ill-defined areas of consolidation are seen throughout both lungs, suspicious for atypical/viral pneumonia. No evidence of pleural effusion. Upper abdomen: Severe hepatic steatosis. Musculoskeletal: No suspicious bone lesions identified. Review of the MIP images confirms the above findings. IMPRESSION: 1. No evidence of pulmonary embolism. 2. Multifocal ill-defined areas of consolidation throughout both lungs, suspicious for atypical/viral pneumonia. 3. Severe hepatic steatosis. Electronically Signed   By: Marlaine Hind M.D.   On: 11/03/2019 19:54   DG Chest Portable 1 View  Result Date: 11/03/2019 CLINICAL DATA:  Left-sided chest pain and shortness of breath. COVID 19 virus infection. EXAM: PORTABLE CHEST 1 VIEW COMPARISON:  None. FINDINGS: Mild cardiomegaly noted. Heterogeneous patchy airspace disease is noted in the mid and lower lung zones bilaterally. No evidence of pleural effusion. IMPRESSION: Heterogeneous patchy airspace disease in mid lower  lung zones, suspicious for viral pneumonia. Electronically Signed   By: Marlaine Hind M.D.   On: 11/03/2019 17:58      Labs: BNP (last 3 results) No results for input(s): BNP in the last 8760 hours. Basic Metabolic Panel: Recent Labs  Lab 11/03/19 1341 11/03/19 2124 11/04/19 0507 11/05/19 0549 11/06/19 0453  NA 140 137 142 142 142  K 3.6 3.2* 3.7 3.6 3.5  CL 106 102 106 108 107  CO2 24 22 27 24 25   GLUCOSE 116* 114* 150* 165* 149*  BUN 17 18 18 19 19   CREATININE 1.09* 1.04* 1.02* 0.91 0.84  CALCIUM 8.4* 8.0* 8.1* 8.0* 8.2*   Liver Function Tests: Recent Labs  Lab 11/03/19 2124  11/04/19 0507 11/05/19 0549 11/06/19 0453  AST 92* 86* 76* 60*  ALT 99* 100* 93* 84*  ALKPHOS 82 80 73 77  BILITOT 0.7 0.7 0.5 0.7  PROT 7.1 7.0 6.7 6.5  ALBUMIN 3.7 3.5 3.3* 3.3*   No results for input(s): LIPASE, AMYLASE in the last 168 hours. No results for input(s): AMMONIA in the last 168 hours. CBC: Recent Labs  Lab 11/03/19 1341 11/04/19 0507 11/05/19 0549 11/06/19 0453  WBC 4.7 3.5* 3.8* 4.5  NEUTROABS  --  2.4 2.7 3.3  HGB 14.4 14.3 13.9 14.1  HCT 43.0 44.9 42.7 43.9  MCV 83.7 88.0 88.2 88.0  PLT 97* 93* 107* 124*   Cardiac Enzymes: No results for input(s): CKTOTAL, CKMB, CKMBINDEX, TROPONINI in the last 168 hours. BNP: Invalid input(s): POCBNP CBG: Recent Labs  Lab 11/05/19 1707  GLUCAP 94   D-Dimer No results for input(s): DDIMER in the last 72 hours. Hgb A1c No results for input(s): HGBA1C in the last 72 hours. Lipid Profile No results for input(s): CHOL, HDL, LDLCALC, TRIG, CHOLHDL, LDLDIRECT in the last 72 hours. Thyroid function studies No results for input(s): TSH, T4TOTAL, T3FREE, THYROIDAB in the last 72 hours.  Invalid input(s): FREET3 Anemia work up Recent Labs    11/05/19 0549 11/06/19 0453  FERRITIN 268 233   Urinalysis    Component Value Date/Time   BILIRUBINUR Neg 03/29/2017 1619   PROTEINUR Trace 03/29/2017 1619   UROBILINOGEN 0.2 03/29/2017 1619   NITRITE Neg 03/29/2017 1619   LEUKOCYTESUR Negative 03/29/2017 1619   Sepsis Labs Invalid input(s): PROCALCITONIN,  WBC,  LACTICIDVEN Microbiology No results found for this or any previous visit (from the past 240 hour(s)).   Total time spend on discharging this patient, including the last patient exam, discussing the hospital stay, instructions for ongoing care as it relates to all pertinent caregivers, as well as preparing the medical discharge records, prescriptions, and/or referrals as applicable, is 40 minutes.    Enzo Bi, MD  [facility] 11/07/2019, 6:42  PM  If 7PM-7AM, please contact night-coverage

## 2019-11-07 ENCOUNTER — Other Ambulatory Visit (HOSPITAL_COMMUNITY): Payer: Self-pay

## 2019-11-07 ENCOUNTER — Ambulatory Visit (HOSPITAL_COMMUNITY)
Admission: RE | Admit: 2019-11-07 | Discharge: 2019-11-07 | Disposition: A | Discharge status: Home / Self Care | Payer: BLUE CROSS/BLUE SHIELD | Source: Ambulatory Visit | Attending: Pulmonary Disease | Admitting: Pulmonary Disease

## 2019-11-07 DIAGNOSIS — U071 COVID-19: Secondary | ICD-10-CM

## 2019-11-07 MED ORDER — METHYLPREDNISOLONE SODIUM SUCC 125 MG IJ SOLR: 125.0000 mg | Freq: Once | INTRAMUSCULAR | Status: DC | PRN

## 2019-11-07 MED ORDER — SODIUM CHLORIDE 0.9 % IV SOLN
INTRAVENOUS | Status: AC
  Filled 2019-11-07: qty 20

## 2019-11-07 MED ORDER — EPINEPHRINE 0.3 MG/0.3ML IJ SOAJ: 0.3000 mg | Freq: Once | INTRAMUSCULAR | Status: DC | PRN

## 2019-11-07 MED ORDER — SODIUM CHLORIDE 0.9 % IV SOLN
100.0000 mg | Freq: Once | INTRAVENOUS | Status: AC
  Administered 2019-11-07: 100 mg via INTRAVENOUS

## 2019-11-07 MED ORDER — DIPHENHYDRAMINE HCL 50 MG/ML IJ SOLN: 50.0000 mg | Freq: Once | INTRAMUSCULAR | Status: DC | PRN

## 2019-11-07 MED ORDER — FAMOTIDINE IN NACL 20-0.9 MG/50ML-% IV SOLN: 20.0000 mg | Freq: Once | INTRAVENOUS | Status: DC | PRN

## 2019-11-07 MED ORDER — SODIUM CHLORIDE 0.9 % IV SOLN
INTRAVENOUS | Status: DC | PRN
  Administered 2019-11-07: 250 mL via INTRAVENOUS

## 2019-11-07 MED ORDER — ALBUTEROL SULFATE HFA 108 (90 BASE) MCG/ACT IN AERS: 2.0000 | INHALATION_SPRAY | Freq: Once | RESPIRATORY_TRACT | Status: DC | PRN

## 2019-11-07 NOTE — Discharge Instructions (Signed)
Prevent the Spread of COVID-19 if You Are Sick If you are sick with COVID-19 or think you might have COVID-19, follow the steps below to help protect other people in your home and community. Stay home except to get medical care.  Stay home. Most people with COVID-19 have mild illness and are able to recover at home without medical care. Do not leave your home, except to get medical care. Do not visit public areas.  Take care of yourself. Get rest and stay hydrated.  Get medical care when needed. Call your doctor before you go to their office for care. But, if you have trouble breathing or other concerning symptoms, call 911 for immediate help.  Avoid public transportation, ride-sharing, or taxis. Separate yourself from other people and pets in your home.  As much as possible, stay in a specific room and away from other people and pets in your home. Also, you should use a separate bathroom, if available. If you need to be around other people or animals in or outside of the home, wear a cloth face covering. ? See COVID-19 and Animals if you have questions about pets: https://www.cdc.gov/coronavirus/2019-ncov/faq.html#COVID19animals Monitor your symptoms.  Common symptoms of COVID-19 include fever and cough. Trouble breathing is a more serious symptom that means you should get medical attention.  Follow care instructions from your healthcare provider and local health department. Your local health authorities will give instructions on checking your symptoms and reporting information. If you develop emergency warning signs for COVID-19 get medical attention immediately.  Emergency warning signs include*:  Trouble breathing  Persistent pain or pressure in the chest  New confusion or not able to be woken  Bluish lips or face *This list is not all inclusive. Please consult your medical provider for any other symptoms that are severe or concerning to you. Call 911 if you have a medical  emergency. If you have a medical emergency and need to call 911, notify the operator that you have or think you might have, COVID-19. If possible, put on a facemask before medical help arrives. Call ahead before visiting your doctor.  Call ahead. Many medical visits for routine care are being postponed or done by phone or telemedicine.  If you have a medical appointment that cannot be postponed, call your doctor's office. This will help the office protect themselves and other patients. If you are sick, wear a cloth covering over your nose and mouth.  You should wear a cloth face covering over your nose and mouth if you must be around other people or animals, including pets (even at home).  You don't need to wear the cloth face covering if you are alone. If you can't put on a cloth face covering (because of trouble breathing for example), cover your coughs and sneezes in some other way. Try to stay at least 6 feet away from other people. This will help protect the people around you. Note: During the COVID-19 pandemic, medical grade facemasks are reserved for healthcare workers and some first responders. You may need to make a cloth face covering using a scarf or bandana. Cover your coughs and sneezes.  Cover your mouth and nose with a tissue when you cough or sneeze.  Throw used tissues in a lined trash can.  Immediately wash your hands with soap and water for at least 20 seconds. If soap and water are not available, clean your hands with an alcohol-based hand sanitizer that contains at least 60% alcohol. Clean your hands often.    Wash your hands often with soap and water for at least 20 seconds. This is especially important after blowing your nose, coughing, or sneezing; going to the bathroom; and before eating or preparing food.  Use hand sanitizer if soap and water are not available. Use an alcohol-based hand sanitizer with at least 60% alcohol, covering all surfaces of your hands and rubbing  them together until they feel dry.  Soap and water are the best option, especially if your hands are visibly dirty.  Avoid touching your eyes, nose, and mouth with unwashed hands. Avoid sharing personal household items.  Do not share dishes, drinking glasses, cups, eating utensils, towels, or bedding with other people in your home.  Wash these items thoroughly after using them with soap and water or put them in the dishwasher. Clean all "high-touch" surfaces everyday.  Clean and disinfect high-touch surfaces in your "sick room" and bathroom. Let someone else clean and disinfect surfaces in common areas, but not your bedroom and bathroom.  If a caregiver or other person needs to clean and disinfect a sick person's bedroom or bathroom, they should do so on an as-needed basis. The caregiver/other person should wear a mask and wait as long as possible after the sick person has used the bathroom. High-touch surfaces include phones, remote controls, counters, tabletops, doorknobs, bathroom fixtures, toilets, keyboards, tablets, and bedside tables.  Clean and disinfect areas that may have blood, stool, or body fluids on them.  Use household cleaners and disinfectants. Clean the area or item with soap and water or another detergent if it is dirty. Then use a household disinfectant. ? Be sure to follow the instructions on the label to ensure safe and effective use of the product. Many products recommend keeping the surface wet for several minutes to ensure germs are killed. Many also recommend precautions such as wearing gloves and making sure you have good ventilation during use of the product. ? Most EPA-registered household disinfectants should be effective. How to discontinue home isolation  People with COVID-19 who have stayed home (home isolated) can stop home isolation under the following conditions: ? If you will not have a test to determine if you are still contagious, you can leave home  after these three things have happened:  You have had no fever for at least 72 hours (that is three full days of no fever without the use of medicine that reduces fevers) AND  other symptoms have improved (for example, when your cough or shortness of breath has improved) AND  at least 10 days have passed since your symptoms first appeared. ? If you will be tested to determine if you are still contagious, you can leave home after these three things have happened:  You no longer have a fever (without the use of medicine that reduces fevers) AND  other symptoms have improved (for example, when your cough or shortness of breath has improved) AND  you received two negative tests in a row, 24 hours apart. Your doctor will follow CDC guidelines. In all cases, follow the guidance of your healthcare provider and local health department. The decision to stop home isolation should be made in consultation with your healthcare provider and state and local health departments. Local decisions depend on local circumstances. cdc.gov/coronavirus 03/17/2019 This information is not intended to replace advice given to you by your health care provider. Make sure you discuss any questions you have with your health care provider. Document Released: 02/26/2019 Document Revised: 03/27/2019 Document Reviewed: 02/26/2019   Elsevier Patient Education  2020 [employer] Inc[employer]  

## 2019-11-07 NOTE — Progress Notes (Signed)
  Diagnosis: COVID-19  Physician: Dr. Joya Gaskins  Procedure: [facility] Med: remdesivir infusion.  Complications: No immediate complications noted.  Discharge: Discharged home   Sandra Wilkins 11/07/2019

## 2019-11-13 ENCOUNTER — Encounter: Payer: Self-pay | Admitting: Family Medicine

## 2019-11-13 DIAGNOSIS — B370 Candidal stomatitis: Secondary | ICD-10-CM

## 2019-11-13 MED ORDER — NYSTATIN 100000 UNIT/ML MT SUSP: 5.0000 mL | Freq: Four times a day (QID) | OROMUCOSAL | 0 refills | Status: DC

## 2019-11-19 ENCOUNTER — Ambulatory Visit: Payer: BLUE CROSS/BLUE SHIELD | Admitting: Physical Therapy

## 2019-11-26 ENCOUNTER — Encounter: Payer: BLUE CROSS/BLUE SHIELD | Admitting: Physical Therapy

## 2019-12-02 ENCOUNTER — Other Ambulatory Visit: Payer: Self-pay

## 2019-12-03 ENCOUNTER — Encounter: Payer: BLUE CROSS/BLUE SHIELD | Admitting: Physical Therapy

## 2019-12-07 ENCOUNTER — Encounter: Payer: Self-pay | Admitting: Family Medicine

## 2019-12-10 ENCOUNTER — Encounter: Payer: BLUE CROSS/BLUE SHIELD | Admitting: Physical Therapy

## 2019-12-17 ENCOUNTER — Encounter: Payer: BLUE CROSS/BLUE SHIELD | Admitting: Physical Therapy

## 2019-12-18 ENCOUNTER — Telehealth (INDEPENDENT_AMBULATORY_CARE_PROVIDER_SITE_OTHER): Payer: BLUE CROSS/BLUE SHIELD | Admitting: Family Medicine

## 2019-12-18 DIAGNOSIS — Z8616 Personal history of COVID-19: Secondary | ICD-10-CM | POA: Diagnosis not present

## 2019-12-18 DIAGNOSIS — R7401 Elevation of levels of liver transaminase levels: Secondary | ICD-10-CM

## 2019-12-18 DIAGNOSIS — D696 Thrombocytopenia, unspecified: Secondary | ICD-10-CM | POA: Diagnosis not present

## 2019-12-18 MED ORDER — FLUTICASONE PROPIONATE 50 MCG/ACT NA SUSP: 2.0000 | Freq: Every day | NASAL | 6 refills | Status: DC

## 2019-12-18 NOTE — Progress Notes (Signed)
Patient: Sandra Wilkins Female    DOB: September 15, 1966   54 y.o.   MRN: [id number] Visit Date: 12/18/2019  Today's Provider: Lavon Paganini, MD   No chief complaint on file.  Subjective:    Virtual Visit via Video Note  I connected with [name] on 12/18/19 at  1:40 PM EST by a video enabled telemedicine application and verified that I am speaking with the correct person using two identifiers.  Location: Patient location: home Provider location: [facility] Persons involved in the visit: patient, provider   I discussed the limitations of evaluation and management by telemedicine and the availability of in person appointments. The patient expressed understanding and agreed to proceed.  HPI    Follow up Hospitalization  Patient was admitted to [facility] on 11/03/2019 and discharged on 11/06/2019. She was treated for COVID-19. Treatment for this included dexamethasone and remdesivir. She reports excellent compliance with treatment. She reports this condition is Improved.   Seeing an improvement every day  Still has some congestion and loss of taste/smell Back hurts after sleeping for 4-5 hours  Still feels tired and DOE when very active - this is improving  Has decreased albuterol inhaler to BID -was not sure if she was supposed to be taking this as needed or scheduled ------------------------------------------------------------------------------------     Allergies  Allergen Reactions  . Codeine Hives  . Other Hives and Itching    Metronidazole or Ciprofloxacin    . Sulfa Antibiotics Hives and Itching  . Tramadol Other (See Comments)    Hallucinations  . Penicillins Rash    Has patient had a PCN reaction causing immediate rash, facial/tongue/throat swelling, SOB or lightheadedness with hypotension: Unknown Has patient had a PCN reaction causing severe rash involving mucus membranes or skin necrosis: Unknown Has patient had a PCN reaction  that required hospitalization: Unknown Has patient had a PCN reaction occurring within the last 10 years: No If all of the above answers are "NO", then may proceed with Cephalosporin use.      Current Outpatient Medications:  .  amitriptyline (ELAVIL) 10 MG tablet, Take 2 tablets (20 mg total) by mouth at bedtime., Disp: , Rfl:  .  Ibuprofen 200 MG CAPS, Take 400 mg by mouth 2 (two) times daily as needed (PAIN). , Disp: , Rfl:  .  omeprazole (PRILOSEC) 20 MG capsule, Take 1 capsule (20 mg total) by mouth daily., Disp: 180 capsule, Rfl: 1 .  sertraline (ZOLOFT) 100 MG tablet, Take 1 tablet (100 mg total) by mouth daily., Disp: , Rfl:  .  cetirizine (ZYRTEC) 10 MG tablet, Take 10 mg by mouth every morning. NOT TAKING SINCE SHE HAS BEEN SICK WITH GALLBLADDER, Disp: , Rfl:  .  guaiFENesin-dextromethorphan (ROBITUSSIN DM) 100-10 MG/5ML syrup, Take 10 mLs by mouth every 4 (four) hours as needed for cough. (Patient not taking: Reported on 12/18/2019), Disp: 118 mL, Rfl: 0 .  nystatin (MYCOSTATIN) 100000 UNIT/ML suspension, Take 5 mLs (500,000 Units total) by mouth 4 (four) times daily., Disp: 60 mL, Rfl: 0  Review of Systems  Constitutional: Positive for fatigue. Negative for activity change, appetite change, chills, diaphoresis, fever and unexpected weight change.  HENT: Positive for congestion. Negative for ear discharge, ear pain, nosebleeds, postnasal drip, rhinorrhea, sinus pressure, sinus pain, sneezing, sore throat, tinnitus and trouble swallowing.   Eyes: Negative.   Respiratory: Positive for cough and shortness of breath (Only when active). Negative for apnea, choking, chest tightness, wheezing and  stridor.   Gastrointestinal: Negative.   Musculoskeletal: Positive for back pain.  Neurological: Positive for headaches. Negative for dizziness and light-headedness.    Social History   Tobacco Use  . Smoking status: Former Smoker    Packs/day: 0.50    Years: 20.00    Pack years: 10.00     Types: Cigarettes    Quit date: 11/13/2005    Years since quitting: 14.1  . Smokeless tobacco: Never Used  Substance Use Topics  . Alcohol use: Yes    Comment: occasional       Objective:   There were no vitals taken for this visit. There were no vitals filed for this visit.There is no height or weight on file to calculate BMI.   Physical Exam Vitals reviewed.  Constitutional:      General: She is not in acute distress.    Appearance: Normal appearance. She is not diaphoretic.  HENT:     Head: Normocephalic and atraumatic.  Pulmonary:     Effort: Pulmonary effort is normal. No respiratory distress.  Neurological:     Mental Status: She is alert and oriented to person, place, and time. Mental status is at baseline.  Psychiatric:        Mood and Affect: Mood normal.        Behavior: Behavior normal.      No results found for any visits on 12/18/19.     Assessment & Plan    Follow Up Instructions:    I discussed the assessment and treatment plan with the patient. The patient was provided an opportunity to ask questions and all were answered. The patient agreed with the plan and demonstrated an understanding of the instructions.   The patient was advised to call back or seek an in-person evaluation if the symptoms worsen or if the condition fails to improve as anticipated.  1. History of COVID-19 -Seems to be recovering well -We did discuss that Covid recovery seems to be a slow process and that the dyspnea with significant exertion should continue to improve and sense of taste and smell should slowly improve as well -I suspect that her back pain is related to deconditioning and myalgias -If her shortness of breath changes or her back pain becomes more localized, will consider imaging to rule out secondary pneumonia or even PE -Return precautions discussed -Start Flonase daily for congestion/allergic rhinitis symptoms - CBC w/Diff/Platelet - Comprehensive metabolic  panel  2. Thrombocytopenia ([place]) -Noted during hospitalization -Likely related to COVID-19 infection -Recheck CBC - CBC w/Diff/Platelet - Comprehensive metabolic panel  3. Transaminitis -Noted during hospitalization -Likely related to COVID-19 infection -Recheck CMP - CBC w/Diff/Platelet - Comprehensive metabolic panel    Meds ordered this encounter  Medications  . fluticasone (FLONASE) 50 MCG/ACT nasal spray    Sig: Place 2 sprays into both nostrils daily.    Dispense:  16 g    Refill:  6     Return in about 3 months (around 03/16/2020) for CPE.   The entirety of the information documented in the History of Present Illness, Review of Systems and Physical Exam were personally obtained by me. Portions of this information were initially documented by Ashley Royalty, CMA and reviewed by me for thoroughness and accuracy.    Sybilla Malhotra, Dionne Bucy, MD MPH [facility]

## 2019-12-24 ENCOUNTER — Encounter: Payer: BLUE CROSS/BLUE SHIELD | Admitting: Physical Therapy

## 2020-01-07 ENCOUNTER — Encounter (INDEPENDENT_AMBULATORY_CARE_PROVIDER_SITE_OTHER): Payer: Self-pay

## 2020-01-23 ENCOUNTER — Ambulatory Visit: Payer: Self-pay | Attending: Internal Medicine

## 2020-01-23 ENCOUNTER — Other Ambulatory Visit: Payer: Self-pay

## 2020-01-23 DIAGNOSIS — Z23 Encounter for immunization: Secondary | ICD-10-CM

## 2020-01-23 NOTE — Progress Notes (Signed)
   Covid-19 Vaccination Clinic  Name:  Sandra Wilkins    MRN: [id number] DOB: 08/05/1966  01/23/2020  Ms. [name] was observed post Covid-19 immunization for 15 minutes without incident. She was provided with Vaccine Information Sheet and instruction to access the V-Safe system.   [name] was instructed to call 911 with any severe reactions post vaccine: Marland Kitchen Difficulty breathing  . Swelling of face and throat  . A fast heartbeat  . A bad rash all over body  . Dizziness and weakness   Immunizations Administered    Name Date Dose VIS Date Route   Pfizer COVID-19 Vaccine 01/23/2020 11:50 AM 0.3 mL 10/25/2019 Intramuscular   Manufacturer: [place]   Lot: [id number]   [place]: [id number]

## 2020-02-08 ENCOUNTER — Other Ambulatory Visit: Payer: Self-pay | Admitting: Family Medicine

## 2020-02-08 DIAGNOSIS — F410 Panic disorder [episodic paroxysmal anxiety] without agoraphobia: Secondary | ICD-10-CM

## 2020-02-08 NOTE — Telephone Encounter (Signed)
   Requested medications are on the active medication list no  Last refill 2/5   Notes to clinic Not delegated and not on current med list

## 2020-02-18 ENCOUNTER — Ambulatory Visit: Payer: Self-pay | Attending: Internal Medicine

## 2020-02-18 DIAGNOSIS — Z23 Encounter for immunization: Secondary | ICD-10-CM

## 2020-02-18 NOTE — Progress Notes (Signed)
   Covid-19 Vaccination Clinic  Name:  Sandra Wilkins    MRN: [id number] DOB: 05-10-1966  02/18/2020  [name] was observed post Covid-19 immunization for 15 minutes without incident. She was provided with Vaccine Information Sheet and instruction to access the V-Safe system.   [name] was instructed to call 911 with any severe reactions post vaccine: Marland Kitchen Difficulty breathing  . Swelling of face and throat  . A fast heartbeat  . A bad rash all over body  . Dizziness and weakness   Immunizations Administered    Name Date Dose VIS Date Route   Pfizer COVID-19 Vaccine 02/18/2020  2:52 PM 0.3 mL 10/25/2019 Intramuscular   Manufacturer: [place]   Lot: [id number]   [place]: [id number]

## 2020-02-19 ENCOUNTER — Ambulatory Visit: Payer: Self-pay

## 2020-03-11 ENCOUNTER — Encounter: Payer: Self-pay | Admitting: Family Medicine

## 2020-03-11 DIAGNOSIS — R519 Headache, unspecified: Secondary | ICD-10-CM

## 2020-03-11 MED ORDER — AMITRIPTYLINE HCL 10 MG PO TABS: 20.0000 mg | ORAL_TABLET | Freq: Every day | ORAL | 1 refills | Status: DC

## 2020-03-11 NOTE — Telephone Encounter (Signed)
Please advise 

## 2020-06-22 ENCOUNTER — Encounter: Payer: Self-pay | Admitting: Family Medicine

## 2020-06-22 DIAGNOSIS — R197 Diarrhea, unspecified: Secondary | ICD-10-CM

## 2020-06-22 DIAGNOSIS — K909 Intestinal malabsorption, unspecified: Secondary | ICD-10-CM

## 2020-06-24 ENCOUNTER — Telehealth: Payer: Self-pay

## 2020-06-24 MED ORDER — CHOLESTYRAMINE 4 GM/DOSE PO POWD: 4.0000 g | Freq: Two times a day (BID) | ORAL | 3 refills | Status: DC

## 2020-06-24 NOTE — Telephone Encounter (Signed)
Copied from [place] [phone]. Topic: General - Other >> Jun 24, 2020  3:13 PM Yvette Rack wrote: Reason for CRM: Pt stated she was told on 06/22/20 that a new medication would be sent to [id number] - [place], [place] but they have not received it. Pt stated she is really sick and need the medication to sent asap.

## 2020-06-25 NOTE — Telephone Encounter (Signed)
Pt is requesting medication to be sent in. Reports that this was discussed earlier this week. Please review. Thanks!

## 2020-06-26 MED ORDER — COLESTIPOL HCL 1 G PO TABS: 1.0000 g | ORAL_TABLET | Freq: Two times a day (BID) | ORAL | 2 refills | Status: DC

## 2020-06-26 NOTE — Addendum Note (Signed)
Addended by: Virginia Crews on: 06/26/2020 08:33 AM   Modules accepted: Orders

## 2020-06-26 NOTE — Telephone Encounter (Signed)
I sent her back a mychart message explaining it. It has been sent in this morning

## 2020-06-29 ENCOUNTER — Other Ambulatory Visit: Payer: Self-pay

## 2020-06-29 DIAGNOSIS — K909 Intestinal malabsorption, unspecified: Secondary | ICD-10-CM

## 2020-06-29 DIAGNOSIS — R197 Diarrhea, unspecified: Secondary | ICD-10-CM

## 2020-06-29 MED ORDER — CHOLESTYRAMINE 4 GM/DOSE PO POWD: 4.0000 g | Freq: Two times a day (BID) | ORAL | 3 refills | Status: DC

## 2020-07-02 NOTE — Addendum Note (Signed)
Addended by: Ashley Royalty E on: 07/02/2020 05:32 PM   Modules accepted: Orders

## 2020-07-02 NOTE — Telephone Encounter (Signed)
LMTCB 07/02/2020.  PEC please advise pt as below.   Thanks,   -Mickel Baas

## 2020-07-02 NOTE — Telephone Encounter (Signed)
Please go ahead and place GI referral and let her know we sent it.  Also recommend low fat diet/BRAT diet

## 2020-07-06 ENCOUNTER — Telehealth: Payer: Self-pay

## 2020-07-06 DIAGNOSIS — K909 Intestinal malabsorption, unspecified: Secondary | ICD-10-CM

## 2020-07-06 NOTE — Telephone Encounter (Signed)
Pt advised.  Referral to Dr. Allen Norris.   Thanks,   -Mickel Baas

## 2020-07-10 ENCOUNTER — Other Ambulatory Visit: Payer: Self-pay | Admitting: Family Medicine

## 2020-07-10 DIAGNOSIS — K909 Intestinal malabsorption, unspecified: Secondary | ICD-10-CM

## 2020-07-10 DIAGNOSIS — R197 Diarrhea, unspecified: Secondary | ICD-10-CM

## 2020-07-14 ENCOUNTER — Other Ambulatory Visit: Payer: Self-pay

## 2020-07-16 ENCOUNTER — Ambulatory Visit (INDEPENDENT_AMBULATORY_CARE_PROVIDER_SITE_OTHER): Payer: BLUE CROSS/BLUE SHIELD | Admitting: Gastroenterology

## 2020-07-16 ENCOUNTER — Other Ambulatory Visit: Payer: Self-pay

## 2020-07-16 ENCOUNTER — Encounter: Payer: Self-pay | Admitting: Gastroenterology

## 2020-07-16 VITALS — BP 102/66 | HR 74 | Temp 98.0°F | Ht 62.0 in | Wt 282.2 lb

## 2020-07-16 DIAGNOSIS — K529 Noninfective gastroenteritis and colitis, unspecified: Secondary | ICD-10-CM

## 2020-07-16 MED ORDER — NA SULFATE-K SULFATE-MG SULF 17.5-3.13-1.6 GM/177ML PO SOLN: 354.0000 mL | Freq: Once | ORAL | 0 refills | Status: AC

## 2020-07-16 NOTE — Progress Notes (Signed)
Cephas Darby, MD [address]  [place]  [place], [place] [address]  Main: [phone]  Fax: [phone]    Gastroenterology Consultation  Referring Provider:     Virginia Crews, MD Primary Care Physician:  Birdie Sons, MD Primary Gastroenterologist:  Dr. Cephas Darby Reason for Consultation:     Chronic diarrhea        HPI:   Sandra Wilkins is a 54 y.o. female referred by Dr. Birdie Sons, MD  for consultation & management of chronic diarrhea.  Patient reports approximately 3 years history of increased bowel frequency, associated with severe abdominal bloating and postprandial urgency.  This has started since she had cholecystectomy in 2018.  She went on intermittent fasting around that time and lost about 14 pounds.  Subsequently, she was sick and lost 20 more pounds.  She tried cholestyramine and currently on colestipol 1 g twice daily which has modestly improved the consistency of the stool.  She also had episodes of urgency associated with accidents at work.  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  She is consuming mostly bread and potato that has led to significant weight gain.  She also notices rectal burning as well as bright red blood per rectum.  She does have pain in her lower abdomen that radiates to the back.  Previous stool studies were negative for infection including C. difficile in 2018.  Patient has known history of fatty liver and noted to have thrombocytopenia 10/2019 when she had Covid pneumonia with acute hypoxic respiratory failure.  Patient denies smoking or alcohol use She denies family history of GI malignancy, inflammatory bowel disease  NSAIDs: None  Antiplts/Anticoagulants/Anti thrombotics: None  GI Procedures:  Patient did not undergo EGD or colonoscopy in the past patient underwent ERCP 04/13/2017 secondary to choledocholithiasis, underwent biliary  sphincterotomy and temporary biliary stent placement with removal in 06/2017 Past Medical History:  Diagnosis Date  . Anxiety   . Arthritis    DDD-NECK  . Depression   . GERD (gastroesophageal reflux disease)   . Headache    MIGRAINES  . Hypertension    H/O WAS ON FUROSEMIDE IN THE PAST BUT LOST WEIGHT AND WAS TAKEN OFF DUE TO BP CONTROL  . Pre-diabetes   . Sleep apnea    CPAP    Past Surgical History:  Procedure Laterality Date  . BREAST BIOPSY Right 2012   benign  . CHOLECYSTECTOMY  04/17/2017   Procedure: CHOLECYSTECTOMY;  Surgeon: Robert Bellow, MD;  Location: [facility] ORS;  Service: General;;  Laparoscopic converted to open  . ERCP N/A 04/13/2017   Procedure: ENDOSCOPIC RETROGRADE CHOLANGIOPANCREATOGRAPHY (ERCP);  Surgeon: Lucilla Lame, MD;  Location: [facility] ENDOSCOPY;  Service: Endoscopy;  Laterality: N/A;  . ERCP N/A 06/20/2017   Procedure: ENDOSCOPIC RETROGRADE CHOLANGIOPANCREATOGRAPHY (ERCP) STENT REMOVAL;  Surgeon: Lucilla Lame, MD;  Location: [facility] ENDOSCOPY;  Service: Endoscopy;  Laterality: N/A;  . TONSILLECTOMY AND ADENOIDECTOMY  1972    Current Outpatient Medications:  .  ALPRAZolam (XANAX) 0.5 MG tablet, Take 0.5-1 tablets (0.25-0.5 mg total) by mouth every 4 (four) hours as needed for anxiety., Disp: 30 tablet, Rfl: 3 .  amitriptyline (ELAVIL) 10 MG tablet, Take 2 tablets (20 mg total) by mouth at bedtime., Disp: 180 tablet, Rfl: 1 .  cetirizine (ZYRTEC) 10 MG tablet, Take 10 mg by mouth every morning. NOT TAKING SINCE SHE HAS BEEN SICK WITH GALLBLADDER, Disp: , Rfl:  .  cholestyramine (QUESTRAN) 4 g packet, Take 1 packet by mouth 2 (two) times daily., Disp: , Rfl:  .  colestipol (COLESTID) 1 g tablet, Take 1 tablet (1 g total) by mouth 2 (two) times daily., Disp: 60 tablet, Rfl: 2 .  omeprazole (PRILOSEC) 20 MG capsule, Take 1 capsule (20 mg total) by mouth daily., Disp: 180 capsule, Rfl: 1 .  sertraline (ZOLOFT) 100 MG tablet, Take 1 tablet (100 mg total) by mouth  daily., Disp: , Rfl:  .  cholestyramine (QUESTRAN) 4 GM/DOSE powder, Take 1 packet (4 g total) by mouth 2 (two) times daily with a meal. (Patient not taking: Reported on 07/16/2020), Disp: 240 g, Rfl: 3 .  Na Sulfate-K Sulfate-Mg Sulf 17.5-3.13-1.6 GM/177ML SOLN, Take 354 mLs by mouth once for 1 dose., Disp: 354 mL, Rfl: 0   Family History  Problem Relation Age of Onset  . Hyperlipidemia Father   . Depression Sister   . Bipolar disorder Brother   . Seizures Brother   . Congestive Heart Failure Maternal Grandfather   . Dementia Paternal Grandfather   . Depression Sister   . Depression Mother   . Arthritis Mother   . Emphysema Mother   . Congestive Heart Failure Mother   . Arthritis Maternal Grandmother   . Asthma Maternal Grandmother   . Breast cancer Neg Hx   . Ovarian cancer Neg Hx   . Colon cancer Neg Hx      Social History   Tobacco Use  . Smoking status: Former Smoker    Packs/day: 0.50    Years: 20.00    Pack years: 10.00    Types: Cigarettes    Quit date: 11/13/2005    Years since quitting: 14.6  . Smokeless tobacco: Never Used  Vaping Use  . Vaping Use: Never used  Substance Use Topics  . Alcohol use: Not Currently    Comment: occasional   . Drug use: No    Allergies as of 07/16/2020 - Review Complete 07/16/2020  Allergen Reaction Noted  . Codeine Hives 04/16/2015  . Other Hives and Itching 04/11/2017  . Sulfa antibiotics Hives and Itching 04/16/2015  . Tramadol Other (See Comments) 04/11/2017  . Penicillins Rash 04/16/2015    Review of Systems:    All systems reviewed and negative except where noted in HPI.   Physical Exam:  BP 102/66 (BP Location: Right Arm, Patient Position: Sitting, Cuff Size: Large)   Pulse 74   Temp 98 F (36.7 C) (Oral)   Ht 5\' 2"  (1.575 m)   Wt 282 lb 4 oz (128 kg)   BMI 51.62 kg/m  No LMP recorded. (Menstrual status: Perimenopausal).  General:   Alert,  Well-developed, well-nourished, pleasant and cooperative in  NAD Head:  Normocephalic and atraumatic. Eyes:  Sclera clear, no icterus.   Conjunctiva pink. Ears:  Normal auditory acuity. Nose:  No deformity, discharge, or lesions. Mouth:  No deformity or lesions,oropharynx pink & moist. Neck:  Supple; no masses or thyromegaly. Lungs:  Respirations even and unlabored.  Clear throughout to auscultation.   No wheezes, crackles, or rhonchi. No acute distress. Heart:  Regular rate and rhythm; no murmurs, clicks, rubs, or gallops. Abdomen:  Normal bowel sounds. Soft, morbidly obese, non-tender and distended, tympanic to percussion without masses, hepatosplenomegaly or hernias noted.  No guarding or rebound tenderness.   Rectal: Not performed Msk:  Symmetrical without gross deformities. Good, equal movement & strength bilaterally. Pulses:  Normal pulses noted. Extremities:  No clubbing or edema.  No  cyanosis. Neurologic:  Alert and oriented x3;  grossly normal neurologically. Skin:  Intact without significant lesions or rashes. No jaundice. Psych:  Alert and cooperative. Normal mood and affect.  Imaging Studies: No abdominal imaging  Assessment and Plan:   Sandra Wilkins is a 54 y.o. Caucasian female with morbid obesity, BMI 51, s/p cholecystectomy secondary to symptomatic cholelithiasis and choledocholithiasis in 2018 is seen in consultation for chronic nonbloody diarrhea, abdominal bloating, postprandial urgency without any other constitutional signs or symptoms  Etiology likely multifactorial combination of bile salt diarrhea or secondary to celiac disease, or EPI or microscopic colitis or IBD  Increase colestipol 1gm to 2 pills 3 times a day Stool studies to rule out infectious etiology Recommend EGD and colonoscopy with gastric and duodenal biopsies, TI evaluation and random colon biopsies  Follow up in 6 weeks   Cephas Darby, MD

## 2020-07-21 ENCOUNTER — Encounter: Payer: Self-pay | Admitting: Gastroenterology

## 2020-07-21 ENCOUNTER — Encounter: Payer: Self-pay | Admitting: General Practice

## 2020-07-23 LAB — GI PROFILE, STOOL, PCR

## 2020-07-24 ENCOUNTER — Encounter (INDEPENDENT_AMBULATORY_CARE_PROVIDER_SITE_OTHER): Payer: Self-pay

## 2020-07-24 ENCOUNTER — Other Ambulatory Visit
Admission: RE | Admit: 2020-07-24 | Discharge: 2020-07-24 | Disposition: A | Discharge status: Home / Self Care | Payer: BLUE CROSS/BLUE SHIELD | Source: Ambulatory Visit | Attending: Gastroenterology | Admitting: Gastroenterology

## 2020-07-24 ENCOUNTER — Other Ambulatory Visit: Payer: Self-pay

## 2020-07-24 DIAGNOSIS — Z20822 Contact with and (suspected) exposure to covid-19: Secondary | ICD-10-CM | POA: Insufficient documentation

## 2020-07-24 DIAGNOSIS — Z01812 Encounter for preprocedural laboratory examination: Secondary | ICD-10-CM | POA: Diagnosis not present

## 2020-07-24 LAB — SARS CORONAVIRUS 2 (TAT 6-24 HRS): SARS Coronavirus 2: NEGATIVE

## 2020-07-28 ENCOUNTER — Encounter
Admission: RE | Disposition: A | Discharge status: Home / Self Care | Payer: Self-pay | Source: Home / Self Care | Attending: Gastroenterology

## 2020-07-28 ENCOUNTER — Ambulatory Visit
Admission: RE | Admit: 2020-07-28 | Discharge: 2020-07-28 | Disposition: A | Discharge status: Home / Self Care | Payer: BLUE CROSS/BLUE SHIELD | Attending: Gastroenterology | Admitting: Gastroenterology

## 2020-07-28 ENCOUNTER — Encounter: Payer: Self-pay | Admitting: Gastroenterology

## 2020-07-28 ENCOUNTER — Other Ambulatory Visit: Payer: Self-pay

## 2020-07-28 ENCOUNTER — Ambulatory Visit: Payer: BLUE CROSS/BLUE SHIELD | Admitting: Anesthesiology

## 2020-07-28 DIAGNOSIS — Z6841 Body Mass Index (BMI) 40.0 and over, adult: Secondary | ICD-10-CM | POA: Insufficient documentation

## 2020-07-28 DIAGNOSIS — M1990 Unspecified osteoarthritis, unspecified site: Secondary | ICD-10-CM | POA: Diagnosis not present

## 2020-07-28 DIAGNOSIS — T884XXA Failed or difficult intubation, initial encounter: Secondary | ICD-10-CM

## 2020-07-28 DIAGNOSIS — G4730 Sleep apnea, unspecified: Secondary | ICD-10-CM | POA: Diagnosis not present

## 2020-07-28 DIAGNOSIS — R197 Diarrhea, unspecified: Secondary | ICD-10-CM | POA: Diagnosis not present

## 2020-07-28 DIAGNOSIS — F419 Anxiety disorder, unspecified: Secondary | ICD-10-CM | POA: Insufficient documentation

## 2020-07-28 DIAGNOSIS — K529 Noninfective gastroenteritis and colitis, unspecified: Secondary | ICD-10-CM

## 2020-07-28 DIAGNOSIS — Z79899 Other long term (current) drug therapy: Secondary | ICD-10-CM | POA: Diagnosis not present

## 2020-07-28 DIAGNOSIS — F329 Major depressive disorder, single episode, unspecified: Secondary | ICD-10-CM | POA: Diagnosis not present

## 2020-07-28 DIAGNOSIS — Z87891 Personal history of nicotine dependence: Secondary | ICD-10-CM | POA: Diagnosis not present

## 2020-07-28 DIAGNOSIS — Z8616 Personal history of COVID-19: Secondary | ICD-10-CM | POA: Diagnosis not present

## 2020-07-28 DIAGNOSIS — K219 Gastro-esophageal reflux disease without esophagitis: Secondary | ICD-10-CM | POA: Insufficient documentation

## 2020-07-28 HISTORY — PX: COLONOSCOPY WITH PROPOFOL: SHX5780

## 2020-07-28 HISTORY — PX: ESOPHAGOGASTRODUODENOSCOPY (EGD) WITH PROPOFOL: SHX5813

## 2020-07-28 LAB — POCT PREGNANCY, URINE: Preg Test, Ur: NEGATIVE

## 2020-07-28 SURGERY — COLONOSCOPY WITH PROPOFOL
Anesthesia: General

## 2020-07-28 MED ORDER — GLYCOPYRROLATE 0.2 MG/ML IJ SOLN
INTRAMUSCULAR | Status: AC
  Filled 2020-07-28: qty 1

## 2020-07-28 MED ORDER — PROPOFOL 500 MG/50ML IV EMUL
INTRAVENOUS | Status: AC
  Filled 2020-07-28: qty 50

## 2020-07-28 MED ORDER — SODIUM CHLORIDE 0.9 % IV SOLN: INTRAVENOUS | Status: DC

## 2020-07-28 MED ORDER — GLYCOPYRROLATE 0.2 MG/ML IJ SOLN
INTRAMUSCULAR | Status: DC | PRN
  Administered 2020-07-28: .2 mg via INTRAVENOUS

## 2020-07-28 MED ORDER — PROPOFOL 10 MG/ML IV BOLUS
INTRAVENOUS | Status: DC | PRN
  Administered 2020-07-28: 30 mg via INTRAVENOUS
  Administered 2020-07-28: 50 mg via INTRAVENOUS

## 2020-07-28 MED ORDER — LIDOCAINE HCL (PF) 2 % IJ SOLN
INTRAMUSCULAR | Status: AC
  Filled 2020-07-28: qty 5

## 2020-07-28 MED ORDER — PROPOFOL 500 MG/50ML IV EMUL
INTRAVENOUS | Status: DC | PRN
  Administered 2020-07-28: 150 ug/kg/min via INTRAVENOUS

## 2020-07-28 NOTE — Anesthesia Postprocedure Evaluation (Signed)
Anesthesia Post Note  Patient: DAKIYA PUOPOLO  Procedure(s) Performed: COLONOSCOPY WITH PROPOFOL (N/A ) ESOPHAGOGASTRODUODENOSCOPY (EGD) WITH PROPOFOL (N/A )  Patient location during evaluation: PACU Anesthesia Type: General Level of consciousness: awake and alert Pain management: pain level controlled Vital Signs Assessment: post-procedure vital signs reviewed and stable Respiratory status: spontaneous breathing, nonlabored ventilation and respiratory function stable Cardiovascular status: blood pressure returned to baseline and stable Postop Assessment: no apparent nausea or vomiting Anesthetic complications: no   No complications documented.   Last Vitals:  Vitals:   07/28/20 0925 07/28/20 0935  BP: (!) 151/73   Pulse: 94   Resp:    Temp:    SpO2: 95% 95%    Last Pain:  Vitals:   07/28/20 0935  TempSrc:   PainSc: 0-No pain                 Brett Canales Julez Huseby

## 2020-07-28 NOTE — Transfer of Care (Signed)
Immediate Anesthesia Transfer of Care Note  Patient: Sandra Wilkins  Procedure(s) Performed: COLONOSCOPY WITH PROPOFOL (N/A ) ESOPHAGOGASTRODUODENOSCOPY (EGD) WITH PROPOFOL (N/A )  Patient Location: PACU and Endoscopy Unit  Anesthesia Type:General  Level of Consciousness: drowsy  Airway & Oxygen Therapy: Patient Spontanous Breathing and Patient connected to nasal cannula oxygen  Post-op Assessment: Report given to RN  Post vital signs: stable  Last Vitals:  Vitals Value Taken Time  BP 139/85 07/28/20 0915  Temp 36.7 C 07/28/20 0915  Pulse 104 07/28/20 0916  Resp 28 07/28/20 0916  SpO2 95 % 07/28/20 0916  Vitals shown include unvalidated device data.  Last Pain:  Vitals:   07/28/20 0915  TempSrc: Temporal  PainSc: 0-No pain         Complications: No complications documented.

## 2020-07-28 NOTE — Op Note (Signed)
[facility] Patient Name: Shauni Henner Procedure Date: 07/28/2020 8:03 AM MRN: [id number] Account #: [id number] Date of Birth: 02-04-1966 Admit Type: Outpatient Age: 54 Room: [facility] ENDO ROOM 2 Gender: Female Note Status: Finalized Procedure:             Colonoscopy Indications:           This is the patient's first colonoscopy, Chronic                         diarrhea Providers:             Lin Landsman MD, MD Medicines:             Monitored Anesthesia Care Complications:         No immediate complications. Estimated blood loss: None. Procedure:             Pre-Anesthesia Assessment:                        - Prior to the procedure, a History and Physical was                         performed, and patient medications and allergies were                         reviewed. The patient is competent. The risks and                         benefits of the procedure and the sedation options and                         risks were discussed with the patient. All questions                         were answered and informed consent was obtained.                         Patient identification and proposed procedure were                         verified by the physician, the nurse, the                         anesthesiologist, the anesthetist and the technician                         in the pre-procedure area in the procedure room in the                         endoscopy suite. Mental Status Examination: alert and                         oriented. Airway Examination: normal oropharyngeal                         airway and neck mobility and Mallampati Class III                         (part of the uvula and soft palate  visualized).                         Respiratory Examination: clear to auscultation. CV                         Examination: normal. Prophylactic Antibiotics: The                         patient does not require prophylactic antibiotics.                          Prior Anticoagulants: The patient has taken no                         previous anticoagulant or antiplatelet agents. ASA                         Grade Assessment: III - A patient with severe systemic                         disease. After reviewing the risks and benefits, the                         patient was deemed in satisfactory condition to                         undergo the procedure. The anesthesia plan was to use                         monitored anesthesia care (MAC). Immediately prior to                         administration of medications, the patient was                         re-assessed for adequacy to receive sedatives. The                         heart rate, respiratory rate, oxygen saturations,                         blood pressure, adequacy of pulmonary ventilation, and                         response to care were monitored throughout the                         procedure. The physical status of the patient was                         re-assessed after the procedure.                        After obtaining informed consent, the colonoscope was                         passed under direct vision. Throughout the procedure,  the patient's blood pressure, pulse, and oxygen                         saturations were monitored continuously. The                         Colonoscope was introduced through the anus and                         advanced to the the cecum, identified by appendiceal                         orifice and ileocecal valve. The colonoscopy was                         unusually difficult due to significant looping and the                         patient's body habitus. Successful completion of the                         procedure was aided by changing the patient to a prone                         position and applying abdominal pressure. The patient                         tolerated the procedure well. The quality  of the bowel                         preparation was evaluated using the BBPS [facility] Bowel                         Preparation Scale) with scores of: Right Colon = 3,                         Transverse Colon = 3 and Left Colon = 3 (entire mucosa                         seen well with no residual staining, small fragments                         of stool or opaque liquid). The total BBPS score                         equals 9. Findings:      The perianal and digital rectal examinations were normal. Pertinent       negatives include normal sphincter tone and no palpable rectal lesions.      The entire examined colon appeared normal. Biopsies for histology were       taken with a cold forceps from the entire colon for evaluation of       microscopic colitis. Unable to intubate TI due to significant looping       despite pressure and several attempts      The retroflexed view of the distal rectum and anal verge was normal and       showed no  anal or rectal abnormalities. Impression:            - The entire examined colon is normal. Biopsied.                        - The distal rectum and anal verge are normal on                         retroflexion view. Recommendation:        - Discharge patient to home (with escort).                        - Resume previous diet today.                        - Continue present medications.                        - Await pathology results.                        - Return to my office as previously scheduled. Procedure Code(s):     --- Professional ---                        [phone], Colonoscopy, flexible; with biopsy, single or                         multiple Diagnosis Code(s):     --- Professional ---                        K52.9, Noninfective gastroenteritis and colitis,                         unspecified CPT copyright 2019 American Medical Association. All rights reserved. The codes documented in this report are preliminary and upon coder review may   be revised to meet current compliance requirements. Dr. Ulyess Mort Lin Landsman MD, MD 07/28/2020 9:08:34 AM This report has been signed electronically. Number of Addenda: 0 Note Initiated On: 07/28/2020 8:03 AM Scope Withdrawal Time: 0 hours 9 minutes 35 seconds  Total Procedure Duration: 0 hours 18 minutes 5 seconds  Estimated Blood Loss:  Estimated blood loss: none. Estimated blood loss: none.      [facility]

## 2020-07-28 NOTE — H&P (Signed)
Cephas Darby, MD [address]  [place]  [place], [place] [address]  Main: [phone]  Fax: [phone] Pager: [phone]  Primary Care Physician:  Virginia Crews, MD Primary Gastroenterologist:  Dr. Cephas Darby  Pre-Procedure History & Physical: HPI:  Sandra Wilkins is a 54 y.o. female is here for an endoscopy and colonoscopy.   Past Medical History:  Diagnosis Date  . Anxiety   . Arthritis    DDD-NECK  . COVID-19 11/04/2019  . Depression   . GERD (gastroesophageal reflux disease)   . Headache    MIGRAINES  . Hypertension    H/O WAS ON FUROSEMIDE IN THE PAST BUT LOST WEIGHT AND WAS TAKEN OFF DUE TO BP CONTROL  . Pneumonia due to COVID-19 virus 11/03/2019  . Pre-diabetes   . Sleep apnea    CPAP    Past Surgical History:  Procedure Laterality Date  . BREAST BIOPSY Right 2012   benign  . CHOLECYSTECTOMY  04/17/2017   Procedure: CHOLECYSTECTOMY;  Surgeon: Robert Bellow, MD;  Location: [facility] ORS;  Service: General;;  Laparoscopic converted to open  . ERCP N/A 04/13/2017   Procedure: ENDOSCOPIC RETROGRADE CHOLANGIOPANCREATOGRAPHY (ERCP);  Surgeon: Lucilla Lame, MD;  Location: [facility] ENDOSCOPY;  Service: Endoscopy;  Laterality: N/A;  . ERCP N/A 06/20/2017   Procedure: ENDOSCOPIC RETROGRADE CHOLANGIOPANCREATOGRAPHY (ERCP) STENT REMOVAL;  Surgeon: Lucilla Lame, MD;  Location: [facility] ENDOSCOPY;  Service: Endoscopy;  Laterality: N/A;  . TONSILLECTOMY AND ADENOIDECTOMY  1972    Prior to Admission medications   Medication Sig Start Date End Date Taking? Authorizing Provider  omeprazole (PRILOSEC) 20 MG capsule Take 1 capsule (20 mg total) by mouth daily. 09/25/19  Yes Bacigalupo, Dionne Bucy, MD  ALPRAZolam Duanne Moron) 0.5 MG tablet Take 0.5-1 tablets (0.25-0.5 mg total) by mouth every 4 (four) hours as needed for anxiety. 02/09/20   Birdie Sons, MD  amitriptyline (ELAVIL) 10 MG tablet Take 2 tablets (20 mg total) by mouth at bedtime. 03/11/20   Virginia Crews, MD  cetirizine (ZYRTEC) 10 MG tablet Take 10 mg by mouth every morning. NOT TAKING SINCE SHE HAS BEEN SICK WITH GALLBLADDER Patient not taking: Reported on 07/28/2020    [provider]  cholestyramine (QUESTRAN) 4 g packet Take 1 packet by mouth 2 (two) times daily. Patient not taking: Reported on 07/28/2020 06/29/20   [provider]  cholestyramine Lucrezia Starch) 4 GM/DOSE powder Take 1 packet (4 g total) by mouth 2 (two) times daily with a meal. Patient not taking: Reported on 07/16/2020 06/29/20   Virginia Crews, MD  colestipol (COLESTID) 1 g tablet Take 1 tablet (1 g total) by mouth 2 (two) times daily. 06/26/20   Virginia Crews, MD  sertraline (ZOLOFT) 100 MG tablet Take 1 tablet (100 mg total) by mouth daily. 11/06/19   Enzo Bi, MD    Allergies as of 07/16/2020 - Review Complete 07/16/2020  Allergen Reaction Noted  . Codeine Hives 04/16/2015  . Other Hives and Itching 04/11/2017  . Sulfa antibiotics Hives and Itching 04/16/2015  . Tramadol Other (See Comments) 04/11/2017  . Penicillins Rash 04/16/2015    Family History  Problem Relation Age of Onset  . Hyperlipidemia Father   . Depression Sister   . Bipolar disorder Brother   . Seizures Brother   . Congestive Heart Failure Maternal Grandfather   . Dementia Paternal Grandfather   . Depression Sister   . Depression Mother   . Arthritis Mother   . Emphysema  Mother   . Congestive Heart Failure Mother   . Arthritis Maternal Grandmother   . Asthma Maternal Grandmother   . Breast cancer Neg Hx   . Ovarian cancer Neg Hx   . Colon cancer Neg Hx     Social History   Socioeconomic History  . Marital status: Married    Spouse name: Not on file  . Number of children: Not on file  . Years of education: Not on file  . Highest education level: Not on file  Occupational History  . Occupation: full time  Tobacco Use  . Smoking status: Former Smoker    Packs/day: 0.50    Years: 20.00    Pack years:  10.00    Types: Cigarettes    Quit date: 11/13/2005    Years since quitting: 14.7  . Smokeless tobacco: Never Used  Vaping Use  . Vaping Use: Never used  Substance and Sexual Activity  . Alcohol use: Not Currently    Comment: occasional   . Drug use: No  . Sexual activity: Not Currently    Birth control/protection: None  Other Topics Concern  . Not on file  Social History Narrative  . Not on file   Social Determinants of Health   Financial Resource Strain:   . Difficulty of Paying Living Expenses: Not on file  Food Insecurity:   . Worried About Charity fundraiser in the Last Year: Not on file  . Ran Out of Food in the Last Year: Not on file  Transportation Needs:   . Lack of Transportation (Medical): Not on file  . Lack of Transportation (Non-Medical): Not on file  Physical Activity:   . Days of Exercise per Week: Not on file  . Minutes of Exercise per Session: Not on file  Stress:   . Feeling of Stress : Not on file  Social Connections:   . Frequency of Communication with Friends and Family: Not on file  . Frequency of Social Gatherings with Friends and Family: Not on file  . Attends Religious Services: Not on file  . Active Member of Clubs or Organizations: Not on file  . Attends Archivist Meetings: Not on file  . Marital Status: Not on file  Intimate Partner Violence:   . Fear of Current or Ex-Partner: Not on file  . Emotionally Abused: Not on file  . Physically Abused: Not on file  . Sexually Abused: Not on file    Review of Systems: See HPI, otherwise negative ROS  Physical Exam: BP (!) 145/94   Pulse 74   Temp (!) 96.3 F (35.7 C) (Temporal)   Resp 16   Ht 5\' 2"  (1.575 m)   Wt 127.9 kg   SpO2 97%   BMI 51.58 kg/m  General:   Alert,  pleasant and cooperative in NAD Head:  Normocephalic and atraumatic. Neck:  Supple; no masses or thyromegaly. Lungs:  Clear throughout to auscultation.    Heart:  Regular rate and rhythm. Abdomen:  Soft,  nontender and nondistended. Normal bowel sounds, without guarding, and without rebound.   Neurologic:  Alert and  oriented x4;  grossly normal neurologically.  Impression/Plan: MATISON NUCCIO is here for an endoscopy and colonoscopy to be performed for chronic diarrhea  Risks, benefits, limitations, and alternatives regarding  endoscopy and colonoscopy have been reviewed with the patient.  Questions have been answered.  All parties agreeable.   [name], MD  07/28/2020, 8:04 AM

## 2020-07-28 NOTE — Op Note (Signed)
[facility] Gastroenterology Patient Name: Sandra Wilkins Procedure Date: 07/28/2020 8:03 AM MRN: [id number] Account #: [id number] Date of Birth: 01-21-1966 Admit Type: Outpatient Age: 54 Room: [facility] ENDO ROOM 2 Gender: Female Note Status: Finalized Procedure:             Upper GI endoscopy Indications:           Diarrhea Providers:             Lin Landsman MD, MD Medicines:             Monitored Anesthesia Care Complications:         No immediate complications. Estimated blood loss: None. Procedure:             Pre-Anesthesia Assessment:                        - Prior to the procedure, a History and Physical was                         performed, and patient medications and allergies were                         reviewed. The patient is competent. The risks and                         benefits of the procedure and the sedation options and                         risks were discussed with the patient. All questions                         were answered and informed consent was obtained.                         Patient identification and proposed procedure were                         verified by the physician, the nurse, the                         anesthesiologist, the anesthetist and the technician                         in the pre-procedure area in the procedure room in the                         endoscopy suite. Mental Status Examination: alert and                         oriented. Airway Examination: normal oropharyngeal                         airway and neck mobility. Respiratory Examination:                         clear to auscultation. CV Examination: normal.                         Prophylactic Antibiotics: The patient does not require  prophylactic antibiotics. Prior Anticoagulants: The                         patient has taken no previous anticoagulant or                         antiplatelet agents. ASA Grade Assessment: III  - A                         patient with severe systemic disease. After reviewing                         the risks and benefits, the patient was deemed in                         satisfactory condition to undergo the procedure. The                         anesthesia plan was to use monitored anesthesia care                         (MAC). Immediately prior to administration of                         medications, the patient was re-assessed for adequacy                         to receive sedatives. The heart rate, respiratory                         rate, oxygen saturations, blood pressure, adequacy of                         pulmonary ventilation, and response to care were                         monitored throughout the procedure. The physical                         status of the patient was re-assessed after the                         procedure.                        After obtaining informed consent, the endoscope was                         passed under direct vision. Throughout the procedure,                         the patient's blood pressure, pulse, and oxygen                         saturations were monitored continuously. The Endoscope                         was introduced through the mouth, and advanced to the  second part of duodenum. The upper GI endoscopy was                         accomplished without difficulty. The patient tolerated                         the procedure well. Findings:      The examined duodenum was normal. Biopsies for histology were taken with       a cold forceps for evaluation of celiac disease.      The entire examined stomach was normal. Biopsies were taken with a cold       forceps for Helicobacter pylori testing.      The cardia and gastric fundus were normal on retroflexion.      Esophagogastric landmarks were identified: the gastroesophageal junction       was found at 38 cm from the incisors.      The  gastroesophageal junction and examined esophagus were normal. Impression:            - Normal examined duodenum. Biopsied.                        - Normal stomach. Biopsied.                        - Esophagogastric landmarks identified.                        - Normal gastroesophageal junction and esophagus. Recommendation:        - Follow an antireflux regimen.                        - Continue present medications.                        - Await pathology results.                        - Proceed with colonoscopy as scheduled                        See colonoscopy report Procedure Code(s):     --- Professional ---                        [phone], Esophagogastroduodenoscopy, flexible,                         transoral; with biopsy, single or multiple Diagnosis Code(s):     --- Professional ---                        R19.7, Diarrhea, unspecified CPT copyright 2019 American Medical Association. All rights reserved. The codes documented in this report are preliminary and upon coder review may  be revised to meet current compliance requirements. Dr. Ulyess Mort Lin Landsman MD, MD 07/28/2020 8:45:05 AM This report has been signed electronically. Number of Addenda: 0 Note Initiated On: 07/28/2020 8:03 AM Estimated Blood Loss:  Estimated blood loss: none.      [facility]

## 2020-07-28 NOTE — Anesthesia Preprocedure Evaluation (Addendum)
Anesthesia Evaluation  Patient identified by MRN, date of birth, ID band Patient awake    Reviewed: Allergy & Precautions, H&P , NPO status , Patient's Chart, lab work & pertinent test results  History of Anesthesia Complications Negative for: history of anesthetic complications  Airway Mallampati: IV   Neck ROM: full  Mouth opening: Limited Mouth Opening Comment: TM 3 FB Small mouth, limited interincisor distance Dental  (+) Chipped   Pulmonary sleep apnea and Continuous Positive Airway Pressure Ventilation , pneumonia (Covid PNA in December 2020), neg COPD, former smoker,    breath sounds clear to auscultation       Cardiovascular hypertension, (-) angina(-) Past MI and (-) Cardiac Stents (-) dysrhythmias  Rhythm:regular Rate:Normal     Neuro/Psych  Headaches, PSYCHIATRIC DISORDERS Anxiety Depression    GI/Hepatic Neg liver ROS, GERD  ,  Endo/Other  Morbid obesity  Renal/GU negative Renal ROS  negative genitourinary   Musculoskeletal  (+) Arthritis ,   Abdominal   Peds  Hematology negative hematology ROS (+)   Anesthesia Other Findings Past Medical History: No date: Anxiety No date: Arthritis     Comment:  DDD-NECK 11/04/2019: COVID-19 No date: Depression No date: GERD (gastroesophageal reflux disease) No date: Headache     Comment:  MIGRAINES No date: Hypertension     Comment:  H/O WAS ON FUROSEMIDE IN THE PAST BUT LOST WEIGHT AND               WAS TAKEN OFF DUE TO BP CONTROL 11/03/2019: Pneumonia due to COVID-19 virus No date: Pre-diabetes No date: Sleep apnea     Comment:  CPAP  Past Surgical History: 2012: BREAST BIOPSY; Right     Comment:  benign 04/17/2017: CHOLECYSTECTOMY     Comment:  Procedure: CHOLECYSTECTOMY;  Surgeon: Robert Bellow, MD;  Location: [facility] ORS;  Service: General;;                Laparoscopic converted to open 04/13/2017: ERCP; N/A     Comment:  Procedure:  ENDOSCOPIC RETROGRADE               CHOLANGIOPANCREATOGRAPHY (ERCP);  Surgeon: Lucilla Lame,               MD;  Location: [facility] ENDOSCOPY;  Service: Endoscopy;                Laterality: N/A; 06/20/2017: ERCP; N/A     Comment:  Procedure: ENDOSCOPIC RETROGRADE               CHOLANGIOPANCREATOGRAPHY (ERCP) STENT REMOVAL;  Surgeon:               Lucilla Lame, MD;  Location: [facility] ENDOSCOPY;  Service:               Endoscopy;  Laterality: N/A; 1972: TONSILLECTOMY AND ADENOIDECTOMY     Reproductive/Obstetrics negative OB ROS                           Anesthesia Physical Anesthesia Plan  ASA: III  Anesthesia Plan: General   Post-op Pain Management:    Induction:   PONV Risk Score and Plan: Propofol infusion and TIVA  Airway Management Planned: Nasal CPAP  Additional Equipment:   Intra-op Plan:   Post-operative Plan:   Informed Consent: I have reviewed the patients History and Physical, chart, labs and discussed the procedure  including the risks, benefits and alternatives for the proposed anesthesia with the patient or authorized representative who has indicated his/her understanding and acceptance.     Dental Advisory Given  Plan Discussed with: Anesthesiologist, CRNA and Surgeon  Anesthesia Plan Comments:         Anesthesia Quick Evaluation

## 2020-07-28 NOTE — Progress Notes (Signed)
   07/28/20 0750  Clinical Encounter Type  Visited With Patient and family together  Visit Type Initial  Referral From Chaplain  Consult/Referral To Chaplain  While rounding the [place] waiting area, chaplain spoke with pt and her husband. When asked how she was doing? Pt said she was tired and nervous. Chaplain told her that she will be praying for her.

## 2020-07-29 ENCOUNTER — Encounter: Payer: Self-pay | Admitting: Gastroenterology

## 2020-07-29 LAB — SURGICAL PATHOLOGY

## 2020-08-18 ENCOUNTER — Other Ambulatory Visit: Payer: Self-pay | Admitting: Family Medicine

## 2020-08-18 DIAGNOSIS — F410 Panic disorder [episodic paroxysmal anxiety] without agoraphobia: Secondary | ICD-10-CM

## 2020-08-18 NOTE — Telephone Encounter (Signed)
Requested medications are due for refill today?  Yes - This medication refill cannot be delegated.    Requested medications are on active medication list?  Yes  Last Refill:   02/09/2020  # 30 with 3 refills   Future visit scheduled?  Yes in one week   Notes to Clinic:  This medication refill cannot be delegated.

## 2020-08-27 NOTE — Progress Notes (Signed)
Established patient visit   Patient: Sandra Wilkins   DOB: Mar 11, 1966   54 y.o. Female  MRN: [id number] Visit Date: 08/28/2020  Today's healthcare provider: Lavon Paganini, MD   Chief Complaint  Patient presents with  . Follow-up   Subjective    HPI  Prediabetes, Follow-up  Lab Results  Component Value Date   HGBA1C 5.7 (H) 07/22/2016   HGBA1C 6.0 04/15/2015   GLUCOSE 149 (H) 11/06/2019   GLUCOSE 165 (H) 11/05/2019   GLUCOSE 150 (H) 11/04/2019    Last seen for for this6 months ago.  Management since that visit includes no changes. Current symptoms include none and have been stable.  Prior visit with dietician: no Current exercise: none  Pertinent Labs:    Component Value Date/Time   CHOL 181 08/01/2018 1503   TRIG 195 (H) 08/01/2018 1503   CHOLHDL 4.6 (H) 08/01/2018 1503   CREATININE 0.84 11/06/2019 0453    Wt Readings from Last 3 Encounters:  08/28/20 280 lb (127 kg)  07/28/20 282 lb (127.9 kg)  07/16/20 282 lb 4 oz (128 kg)    Lipid/Cholesterol, Follow-up  Last lipid panel Other pertinent labs  Lab Results  Component Value Date   CHOL 181 08/01/2018   HDL 39 (L) 08/01/2018   LDLCALC 103 (H) 08/01/2018   TRIG 195 (H) 08/01/2018   CHOLHDL 4.6 (H) 08/01/2018   Lab Results  Component Value Date   ALT 84 (H) 11/06/2019   AST 60 (H) 11/06/2019   PLT 124 (L) 11/06/2019   TSH 3.170 09/26/2019     She was last seen for this 6 months ago.  Management since that visit includes no changes.  She reports good compliance with treatment. She is not having side effects.   Symptoms: No chest pain No chest pressure/discomfort  No dyspnea No lower extremity edema  No numbness or tingling of extremity No orthopnea  No palpitations No paroxysmal nocturnal dyspnea  No speech difficulty No syncope   Current diet: in general, an "unhealthy" diet Current exercise: none  The 10-year ASCVD risk score Mikey Bussing DC Jr., et al., 2013) is: 1.5%  Anxiety,  Follow-up  She was last seen for anxiety 6 months ago. Changes made at last visit include no changes.   She reports good compliance with treatment. She reports fair tolerance of treatment. She is not having side effects.   She feels her anxiety is moderate and Worse since last visit.  Symptoms: No chest pain No difficulty concentrating  No dizziness Yes fatigue  Yes feelings of losing control Yes insomnia  Yes irritable No palpitations  Yes panic attacks Yes racing thoughts  No shortness of breath No sweating  No tremors/shakes    GAD-7 Results GAD-7 Generalized Anxiety Disorder Screening Tool 10/15/2019  1. Feeling Nervous, Anxious, or on Edge 0  2. Not Being Able to Stop or Control Worrying 2  3. Worrying Too Much About Different Things 3  4. Trouble Relaxing 2  5. Being So Restless it's Hard To Sit Still 0  6. Becoming Easily Annoyed or Irritable 1  7. Feeling Afraid As If Something Awful Might Happen 0  Total GAD-7 Score 8  Difficulty At Work, Home, or Getting  Along With Others? Somewhat difficult    PHQ-9 Scores PHQ9 SCORE ONLY 08/28/2020 10/15/2019 02/14/2018  PHQ-9 Total Score 19 23 16     Social History   Tobacco Use  . Smoking status: Former Smoker    Packs/day: 0.50    Years:  20.00    Pack years: 10.00    Types: Cigarettes    Quit date: 11/13/2005    Years since quitting: 14.8  . Smokeless tobacco: Never Used  Vaping Use  . Vaping Use: Never used  Substance Use Topics  . Alcohol use: Not Currently    Comment: occasional   . Drug use: No       Medications: Outpatient Medications Prior to Visit  Medication Sig  . ALPRAZolam (XANAX) 0.5 MG tablet TAKE 0.5-1 TABLETS (0.25-0.5 MG TOTAL) BY MOUTH EVERY 4 (FOUR) HOURS AS NEEDED FOR ANXIETY.  Marland Kitchen amitriptyline (ELAVIL) 10 MG tablet Take 2 tablets (20 mg total) by mouth at bedtime.  . colestipol (COLESTID) 1 g tablet Take 1 tablet (1 g total) by mouth 2 (two) times daily.  Marland Kitchen omeprazole (PRILOSEC) 20 MG  capsule Take 1 capsule (20 mg total) by mouth daily.  . sertraline (ZOLOFT) 100 MG tablet Take 1 tablet (100 mg total) by mouth daily.   No facility-administered medications prior to visit.    Review of Systems  Constitutional: Positive for fatigue.  Respiratory: Negative.   Cardiovascular: Negative.   Gastrointestinal: Positive for abdominal pain, diarrhea and nausea. Negative for anal bleeding, blood in stool, constipation, rectal pain and vomiting.  Musculoskeletal: Positive for back pain.  Neurological: Negative.       Objective    BP (!) 108/91   Pulse 81   Temp 98.5 F (36.9 C)   Resp 16   Ht 5\' 2"  (1.575 m)   Wt 280 lb (127 kg)   BMI 51.21 kg/m    Physical Exam Vitals reviewed.  Constitutional:      General: She is not in acute distress.    Appearance: Normal appearance. She is well-developed. She is not diaphoretic.  HENT:     Head: Normocephalic and atraumatic.  Eyes:     General: No scleral icterus.    Conjunctiva/sclera: Conjunctivae normal.  Neck:     Thyroid: No thyromegaly.  Cardiovascular:     Rate and Rhythm: Normal rate and regular rhythm.     Pulses: Normal pulses.     Heart sounds: Normal heart sounds. No murmur heard.   Pulmonary:     Effort: Pulmonary effort is normal. No respiratory distress.     Breath sounds: Normal breath sounds. No wheezing, rhonchi or rales.  Musculoskeletal:     Cervical back: Neck supple.     Right lower leg: No edema.     Left lower leg: No edema.  Lymphadenopathy:     Cervical: No cervical adenopathy.  Skin:    General: Skin is warm and dry.     Findings: No rash.  Neurological:     Mental Status: She is alert and oriented to person, place, and time. Mental status is at baseline.  Psychiatric:        Mood and Affect: Mood normal.        Behavior: Behavior normal.       No results found for any visits on 08/28/20.  Assessment & Plan     Problem List Items Addressed This Visit      Digestive    Chronic diarrhea    Benign evaluation by GI including colonoscopy Continue Colestid at current dose May consider intermittent FMLA leave given fecal incontinence and having to leave work intermittently      Relevant Orders   Comprehensive metabolic panel     Other   Anxiety disorder    Chronic and fairly  well controlled Continue zoloft and prn Xanax Encouraged therapy      Pre-diabetes - Primary    Recheck A1c Recommend low carb diet      Relevant Orders   Hemoglobin A1c   Morbid obesity ([place])    Discussed importance of healthy weight management Discussed diet and exercise       Relevant Orders   Lipid panel   CBC w/Diff/Platelet   Comprehensive metabolic panel   Hemoglobin A1c   Mixed hyperlipidemia    Reviewed last lipid panel Not currently on a statin Recheck FLP and CMP Discussed diet and exercise       Relevant Orders   Lipid panel   Comprehensive metabolic panel   Moderate episode of recurrent major depressive disorder (HCC)    Related to chronic medical conditions Continue zoloft Does not feel that dose increase would help symptoms Encouraged therapy Contracted for safety - no SI/HI      Thrombocytopenia (HCC)    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      Total time spent on today's visit was greater than 40 minutes, including both face-to-face time and nonface-to-face time personally spent on review of chart (labs and imaging), discussing labs and goals, discussing further work-up, treatment options, referrals to specialist  if needed, reviewing outside records of pertinent, answering patient's questions, and coordinating care.    I, Lavon Paganini, MD, have reviewed all documentation for this visit. The documentation on 08/28/20 for the exam, diagnosis, procedures, and orders are all accurate and complete.   Dorn Hartshorne, Dionne Bucy, MD, MPH [facility]

## 2020-08-28 ENCOUNTER — Encounter: Payer: Self-pay | Admitting: Family Medicine

## 2020-08-28 ENCOUNTER — Other Ambulatory Visit: Payer: Self-pay

## 2020-08-28 ENCOUNTER — Ambulatory Visit (INDEPENDENT_AMBULATORY_CARE_PROVIDER_SITE_OTHER): Payer: BLUE CROSS/BLUE SHIELD | Admitting: Family Medicine

## 2020-08-28 VITALS — BP 108/91 | HR 81 | Temp 98.5°F | Resp 16 | Ht 62.0 in | Wt 280.0 lb

## 2020-08-28 DIAGNOSIS — Z23 Encounter for immunization: Secondary | ICD-10-CM | POA: Diagnosis not present

## 2020-08-28 DIAGNOSIS — D696 Thrombocytopenia, unspecified: Secondary | ICD-10-CM

## 2020-08-28 DIAGNOSIS — Z1231 Encounter for screening mammogram for malignant neoplasm of breast: Secondary | ICD-10-CM

## 2020-08-28 DIAGNOSIS — R7303 Prediabetes: Secondary | ICD-10-CM | POA: Diagnosis not present

## 2020-08-28 DIAGNOSIS — E782 Mixed hyperlipidemia: Secondary | ICD-10-CM | POA: Insufficient documentation

## 2020-08-28 DIAGNOSIS — K529 Noninfective gastroenteritis and colitis, unspecified: Secondary | ICD-10-CM | POA: Diagnosis not present

## 2020-08-28 DIAGNOSIS — F411 Generalized anxiety disorder: Secondary | ICD-10-CM

## 2020-08-28 DIAGNOSIS — F331 Major depressive disorder, recurrent, moderate: Secondary | ICD-10-CM

## 2020-08-28 DIAGNOSIS — Z1159 Encounter for screening for other viral diseases: Secondary | ICD-10-CM

## 2020-08-28 DIAGNOSIS — Z6841 Body Mass Index (BMI) 40.0 and over, adult: Secondary | ICD-10-CM

## 2020-08-28 NOTE — Assessment & Plan Note (Signed)
Discussed importance of healthy weight management Discussed diet and exercise  

## 2020-08-28 NOTE — Assessment & Plan Note (Signed)
Recheck A1c Recommend low carb diet

## 2020-08-28 NOTE — Assessment & Plan Note (Signed)
Related to chronic medical conditions Continue zoloft Does not feel that dose increase would help symptoms Encouraged therapy Contracted for safety - no SI/HI

## 2020-08-28 NOTE — Assessment & Plan Note (Signed)
Benign evaluation by GI including colonoscopy Continue Colestid at current dose May consider intermittent FMLA leave given fecal incontinence and having to leave work intermittently

## 2020-08-28 NOTE — Assessment & Plan Note (Signed)
Chronic and fairly well controlled Continue zoloft and prn Xanax Encouraged therapy

## 2020-08-28 NOTE — Assessment & Plan Note (Signed)
Likely related to NASH Recheck CBC

## 2020-08-28 NOTE — Assessment & Plan Note (Signed)
Reviewed last lipid panel Not currently on a statin Recheck FLP and CMP Discussed diet and exercise  

## 2020-08-29 LAB — HEPATITIS C ANTIBODY: Hep C Virus Ab: <0.1 s/co ratio (ref 0.0–0.9)

## 2020-08-29 LAB — CBC WITH DIFFERENTIAL/PLATELET
Basophils Absolute: 0 10*3/uL (ref 0.0–0.2)
Basos: 1 %
EOS (ABSOLUTE): 0.1 10*3/uL (ref 0.0–0.4)
Eos: 2 %
Hematocrit: 44.9 % (ref 34.0–46.6)
Hemoglobin: 14.6 g/dL (ref 11.1–15.9)
Immature Grans (Abs): 0.1 10*3/uL (ref 0.0–0.1)
Immature Granulocytes: 1 %
Lymphocytes Absolute: 1.6 10*3/uL (ref 0.7–3.1)
Lymphs: 23 %
MCH: 28.3 pg (ref 26.6–33.0)
MCHC: 32.5 g/dL (ref 31.5–35.7)
MCV: 87 fL (ref 79–97)
Monocytes Absolute: 0.5 10*3/uL (ref 0.1–0.9)
Monocytes: 7 %
Neutrophils Absolute: 4.7 10*3/uL (ref 1.4–7.0)
Neutrophils: 66 %
Platelets: 211 10*3/uL (ref 150–450)
RBC: 5.16 x10E6/uL (ref 3.77–5.28)
RDW: 13.4 % (ref 11.7–15.4)
WBC: 7 10*3/uL (ref 3.4–10.8)

## 2020-08-29 LAB — COMPREHENSIVE METABOLIC PANEL
ALT: 73 IU/L — ABNORMAL HIGH (ref 0–32)
AST: 49 IU/L — ABNORMAL HIGH (ref 0–40)
Albumin/Globulin Ratio: 1.7 (ref 1.2–2.2)
Albumin: 4.3 g/dL (ref 3.8–4.9)
Alkaline Phosphatase: 139 IU/L — ABNORMAL HIGH (ref 44–121)
BUN/Creatinine Ratio: 12 (ref 9–23)
BUN: 12 mg/dL (ref 6–24)
Bilirubin Total: 0.5 mg/dL (ref 0.0–1.2)
CO2: 24 mmol/L (ref 20–29)
Calcium: 9.1 mg/dL (ref 8.7–10.2)
Chloride: 104 mmol/L (ref 96–106)
Creatinine, Ser: 1.02 mg/dL — ABNORMAL HIGH (ref 0.57–1.00)
GFR calc Af Amer: 73 mL/min/{1.73_m2} (ref >59)
GFR calc non Af Amer: 63 mL/min/{1.73_m2} (ref >59)
Globulin, Total: 2.5 g/dL (ref 1.5–4.5)
Glucose: 99 mg/dL (ref 65–99)
Potassium: 3.5 mmol/L (ref 3.5–5.2)
Sodium: 142 mmol/L (ref 134–144)
Total Protein: 6.8 g/dL (ref 6.0–8.5)

## 2020-08-29 LAB — LIPID PANEL
Chol/HDL Ratio: 4.1 ratio (ref 0.0–4.4)
Cholesterol, Total: 164 mg/dL (ref 100–199)
HDL: 40 mg/dL (ref >39)
LDL Chol Calc (NIH): 97 mg/dL (ref 0–99)
Triglycerides: 152 mg/dL — ABNORMAL HIGH (ref 0–149)
VLDL Cholesterol Cal: 27 mg/dL (ref 5–40)

## 2020-08-29 LAB — HEMOGLOBIN A1C
Est. average glucose Bld gHb Est-mCnc: 126 mg/dL
Hgb A1c MFr Bld: 6 % — ABNORMAL HIGH (ref 4.8–5.6)

## 2020-09-02 ENCOUNTER — Telehealth: Payer: Self-pay

## 2020-09-02 NOTE — Telephone Encounter (Signed)
LMTCB 09/02/2020.  PEC please advise of lab results as below.    Thanks,   -Mickel Baas

## 2020-09-02 NOTE — Telephone Encounter (Signed)
-----  Message from Virginia Crews, MD sent at 09/01/2020  9:03 AM EDT ----- Normal/stable labs. Prediabetes. Well controlled cholesterol.  Slightly elevation in Alk Phos. Recommend hydration and repeat LFTs in 1 month.

## 2020-09-03 ENCOUNTER — Other Ambulatory Visit: Payer: Self-pay | Admitting: Family Medicine

## 2020-09-03 DIAGNOSIS — R519 Headache, unspecified: Secondary | ICD-10-CM

## 2020-09-04 NOTE — Telephone Encounter (Signed)
Written by Virginia Crews, MD on 09/01/2020 9:03 AM EDT Seen by patient Sandra Wilkins on 09/04/2020 1:13 AM

## 2020-09-04 NOTE — Telephone Encounter (Signed)
-----  Message from Virginia Crews, MD sent at 09/01/2020  9:03 AM EDT ----- Normal/stable labs. Prediabetes. Well controlled cholesterol.  Slightly elevation in Alk Phos. Recommend hydration and repeat LFTs in 1 month.

## 2020-09-09 ENCOUNTER — Encounter: Payer: Self-pay | Admitting: Family Medicine

## 2020-09-11 ENCOUNTER — Other Ambulatory Visit: Payer: Self-pay | Admitting: Family Medicine

## 2020-09-12 ENCOUNTER — Encounter: Payer: Self-pay | Admitting: Gastroenterology

## 2020-09-15 ENCOUNTER — Ambulatory Visit: Payer: BLUE CROSS/BLUE SHIELD | Admitting: Gastroenterology

## 2020-09-16 ENCOUNTER — Encounter (INDEPENDENT_AMBULATORY_CARE_PROVIDER_SITE_OTHER): Payer: Self-pay

## 2020-09-16 ENCOUNTER — Other Ambulatory Visit: Payer: Self-pay | Admitting: Family Medicine

## 2020-09-16 NOTE — Telephone Encounter (Signed)
Requested medication (s) are due for refill today: yes   Requested medication (s) are on the active medication list: yes   Last refill:  11/06/2019 no dispense amount noted   Future visit scheduled: yes   Notes to clinic:  last ordered by Enzo Bi, MD. Do you want to refill?     Requested Prescriptions  Pending Prescriptions Disp Refills   sertraline (ZOLOFT) 100 MG tablet [Pharmacy Med Name: SERTRALINE HCL 100 MG TABLET] 180 tablet 3    Sig: TAKE 1 TABLET BY MOUTH TWICE A DAY      Psychiatry:  Antidepressants - SSRI Passed - 09/16/2020  2:06 PM      Passed - Completed PHQ-2 or PHQ-9 in the last 360 days      Passed - Valid encounter within last 6 months    Recent Outpatient Visits           2 weeks ago [place], Dionne Bucy, MD   9 months ago [place], Dionne Bucy, MD   11 months ago Ocular migraine   [facility] [place], Dionne Bucy, MD   11 months ago Ocular migraine   [facility] [place], Dionne Bucy, MD   1 year ago Chest pain, unspecified type   [facility], Dionne Bucy, MD       Future Appointments             In 5 months [place], Dionne Bucy, MD [facility] [place]

## 2020-09-16 NOTE — Telephone Encounter (Signed)
Please review "notes to clinic" from [facility] nurse.   Notes to clinic:  last ordered by Enzo Bi, MD. Do you want to refill?

## 2020-10-05 ENCOUNTER — Encounter: Payer: Self-pay | Admitting: Family Medicine

## 2020-10-21 NOTE — Progress Notes (Signed)
Established patient visit   Patient: Sandra Wilkins   DOB: 01-Aug-1966   54 y.o. Female  MRN: [id number] Visit Date: 10/23/2020  Today's healthcare provider: Lavon Paganini, MD   Chief Complaint  Patient presents with  . Diarrhea   Subjective    Diarrhea  Associated symptoms include abdominal pain. Pertinent negatives include no chills, fever or vomiting.  She reports missing 6 days of work due to bowel incontinence.   Patient is requesting short-term disability forms to be completed that will excuse her from missing time at for in the event of future episodes.   She is followed by GI, but awaiting next follow-up appointment.  Her medication initially was helpful, but she is now having more difficulty with incontinence and not making it to the bathroom on time with her diarrhea.  Patient brings a letter with her today describing her symptoms, which I will scanned into the chart.  The just is that she often has diarrhea first thing in the morning every day 3-6 times before leaving home which makes her late to work on a daily basis.  She has had episodes of incontinence in her car on the way to and from work.  She is unable to sit for long periods of time and concentrate at work because she has to be able to run to the bathroom on about moments notice.  No matter what she eats, she is having diarrhea.  Social History   Tobacco Use  . Smoking status: Former Smoker    Packs/day: 0.50    Years: 20.00    Pack years: 10.00    Types: Cigarettes    Quit date: 11/13/2005    Years since quitting: 14.9  . Smokeless tobacco: Never Used  Vaping Use  . Vaping Use: Never used  Substance Use Topics  . Alcohol use: Not Currently    Comment: occasional   . Drug use: No       Medications: Outpatient Medications Prior to Visit  Medication Sig  . ALPRAZolam (XANAX) 0.5 MG tablet TAKE 0.5-1 TABLETS (0.25-0.5 MG TOTAL) BY MOUTH EVERY 4 (FOUR) HOURS AS NEEDED FOR ANXIETY.  Marland Kitchen  amitriptyline (ELAVIL) 10 MG tablet TAKE 2 TABLETS (20 MG TOTAL) BY MOUTH AT BEDTIME.  . colestipol (COLESTID) 1 g tablet TAKE 1 TABLET (1 G TOTAL) BY MOUTH 2 (TWO) TIMES DAILY. (Patient taking differently: Take 2 g by mouth 3 (three) times daily.)  . omeprazole (PRILOSEC) 20 MG capsule Take 1 capsule (20 mg total) by mouth daily. (Patient taking differently: Take 20 mg by mouth 2 (two) times daily before a meal.)  . sertraline (ZOLOFT) 100 MG tablet TAKE 1 TABLET BY MOUTH TWICE A DAY   No facility-administered medications prior to visit.    Review of Systems  Constitutional: Positive for fatigue. Negative for appetite change, chills and fever.  Respiratory: Negative for chest tightness and shortness of breath.   Cardiovascular: Negative for chest pain and palpitations.  Gastrointestinal: Positive for abdominal pain, diarrhea (watery) and nausea. Negative for vomiting.       Bowel incontinence  Neurological: Positive for weakness. Negative for dizziness.      Objective    BP 134/89 (BP Location: Right Arm, Patient Position: Sitting, Cuff Size: Large)   Pulse 87   Temp 97.9 F (36.6 C) (Oral)   Resp 18   Wt 284 lb 8 oz (129 kg)   BMI 52.04 kg/m    Physical Exam Vitals reviewed.  Constitutional:  General: She is not in acute distress.    Appearance: She is well-developed.  HENT:     Head: Normocephalic and atraumatic.  Eyes:     General: No scleral icterus.    Conjunctiva/sclera: Conjunctivae normal.  Cardiovascular:     Rate and Rhythm: Normal rate and regular rhythm.  Pulmonary:     Effort: Pulmonary effort is normal. No respiratory distress.  Skin:    General: Skin is warm and dry.     Findings: No rash.  Neurological:     Mental Status: She is alert and oriented to person, place, and time.  Psychiatric:        Behavior: Behavior normal.       No results found for any visits on 10/23/20.  Assessment & Plan     1. Chronic diarrhea 2. Bile salt-induced  diarrhea 3. Incontinence of feces with fecal urgency -Ongoing problem -Continues to have work-up with GI -Seems to have started after cholecystectomy resulting in bile acid induced diarrhea that is chronic in nature and associated with bowel incontinence -Discussed short-term disability with the patient today and discussed her abilities and disabilities. -We will complete disability paperwork per our policy and let the patient know when it is completed and ready to be submitted -Advised to continue to follow-up with GI   No follow-ups on file.      I, Lavon Paganini, MD, have reviewed all documentation for this visit. The documentation on 10/23/20 for the exam, diagnosis, procedures, and orders are all accurate and complete.   Herndon Grill, Dionne Bucy, MD, MPH [facility]

## 2020-10-23 ENCOUNTER — Other Ambulatory Visit: Payer: Self-pay

## 2020-10-23 ENCOUNTER — Ambulatory Visit (INDEPENDENT_AMBULATORY_CARE_PROVIDER_SITE_OTHER): Payer: Self-pay | Admitting: Family Medicine

## 2020-10-23 ENCOUNTER — Encounter: Payer: Self-pay | Admitting: Family Medicine

## 2020-10-23 VITALS — BP 134/89 | HR 87 | Temp 97.9°F | Resp 18 | Wt 284.5 lb

## 2020-10-23 DIAGNOSIS — K529 Noninfective gastroenteritis and colitis, unspecified: Secondary | ICD-10-CM

## 2020-10-23 DIAGNOSIS — R159 Full incontinence of feces: Secondary | ICD-10-CM

## 2020-10-23 DIAGNOSIS — K9089 Other intestinal malabsorption: Secondary | ICD-10-CM | POA: Insufficient documentation

## 2020-10-23 DIAGNOSIS — R152 Fecal urgency: Secondary | ICD-10-CM

## 2020-10-27 ENCOUNTER — Telehealth: Payer: Self-pay

## 2020-10-27 NOTE — Telephone Encounter (Signed)
LM for pt to call back to advise if she wants her FMLA/Disability and records to be faxed and if so, where & number? Or, if pt would like to pick them up. TNP

## 2020-10-30 NOTE — Telephone Encounter (Signed)
Pt picked up forms this afternoon. TNP

## 2020-11-29 ENCOUNTER — Encounter: Payer: Self-pay | Admitting: Family Medicine

## 2020-11-29 ENCOUNTER — Other Ambulatory Visit: Payer: Self-pay | Admitting: Family Medicine

## 2020-11-29 DIAGNOSIS — R7303 Prediabetes: Secondary | ICD-10-CM

## 2020-11-29 DIAGNOSIS — F410 Panic disorder [episodic paroxysmal anxiety] without agoraphobia: Secondary | ICD-10-CM

## 2020-12-01 MED ORDER — SERTRALINE HCL 100 MG PO TABS
100.0000 mg | ORAL_TABLET | Freq: Two times a day (BID) | ORAL | 1 refills | Status: DC
Start: 2020-12-01 — End: 2021-10-09

## 2020-12-01 MED ORDER — ALPRAZOLAM 0.5 MG PO TABS: 0.2500 mg | ORAL_TABLET | ORAL | 2 refills | Status: DC | PRN

## 2020-12-10 ENCOUNTER — Encounter (INDEPENDENT_AMBULATORY_CARE_PROVIDER_SITE_OTHER): Payer: Self-pay

## 2020-12-23 ENCOUNTER — Encounter: Payer: Self-pay | Admitting: Gastroenterology

## 2020-12-23 ENCOUNTER — Ambulatory Visit (INDEPENDENT_AMBULATORY_CARE_PROVIDER_SITE_OTHER): Payer: 59 | Admitting: Gastroenterology

## 2020-12-23 ENCOUNTER — Other Ambulatory Visit: Payer: Self-pay

## 2020-12-23 VITALS — BP 113/74 | HR 87 | Temp 97.8°F | Ht 62.0 in | Wt 289.4 lb

## 2020-12-23 DIAGNOSIS — R519 Headache, unspecified: Secondary | ICD-10-CM

## 2020-12-23 DIAGNOSIS — R7989 Other specified abnormal findings of blood chemistry: Secondary | ICD-10-CM | POA: Diagnosis not present

## 2020-12-23 DIAGNOSIS — K580 Irritable bowel syndrome with diarrhea: Secondary | ICD-10-CM | POA: Diagnosis not present

## 2020-12-23 MED ORDER — AMITRIPTYLINE HCL 25 MG PO TABS: 50.0000 mg | ORAL_TABLET | Freq: Every day | ORAL | 2 refills | Status: DC

## 2020-12-23 MED ORDER — RIFAXIMIN 550 MG PO TABS: 550.0000 mg | ORAL_TABLET | Freq: Three times a day (TID) | ORAL | 0 refills | Status: AC

## 2020-12-23 NOTE — Progress Notes (Signed)
Cephas Darby, MD [address]  [place]  [place], [place] [address]  Main: [phone]  Fax: [phone]    Gastroenterology Consultation  Referring Provider:     Birdie Sons, MD Primary Care Physician:  Virginia Crews, MD Primary Gastroenterologist:  Dr. Cephas Darby Reason for Consultation:     Chronic diarrhea        HPI:   Sandra Wilkins is a 55 y.o. female referred by Dr. Brita Romp, Dionne Bucy, MD  for consultation & management of chronic diarrhea.  Patient reports approximately 3 years history of increased bowel frequency, associated with severe abdominal bloating and postprandial urgency.  This has started since she had cholecystectomy in 2018.  She went on intermittent fasting around that time and lost about 14 pounds.  Subsequently, she was sick and lost 20 more pounds.  She tried cholestyramine and currently on colestipol 1 g twice daily which has modestly improved the consistency of the stool.  She also had episodes of urgency associated with accidents at work.  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  She is consuming mostly bread and potato that has led to significant weight gain.  She also notices rectal burning as well as bright red blood per rectum.  She does have pain in her lower abdomen that radiates to the back.  Previous stool studies were negative for infection including C. difficile in 2018.  Patient has known history of fatty liver and noted to have thrombocytopenia 10/2019 when she had Covid pneumonia with acute hypoxic respiratory failure.  Follow-up visit 12/23/2020 Patient is here for follow-up of diarrhea.  She continues to have 5-6 episodes of nonbloody diarrhea associated with abdominal bloating and cramps and sometimes fecal incontinence.  She is unable to get out due to ongoing diarrhea.  She is on short-term disability through her work, currently working from home.  She  is taking Imodium at least twice a day.  Her upper endoscopy and colonoscopy were unremarkable.  She increased colestipol to 2 tablets 3 times daily which is helping with her abdominal cramps but not so much with diarrhea.  Her weight is stable. She feels depressed, currently taking amitriptyline 20 mg for her headaches.  Patient denies smoking or alcohol use She denies family history of GI malignancy, inflammatory bowel disease  NSAIDs: None  Antiplts/Anticoagulants/Anti thrombotics: None  GI Procedures:  Patient did not undergo EGD or colonoscopy in the past patient underwent ERCP 04/13/2017 secondary to choledocholithiasis, underwent biliary sphincterotomy and temporary biliary stent placement with removal in 06/2017  EGD and colonoscopy 07/28/2020 - Normal examined duodenum. Biopsied. - Normal stomach. Biopsied. - Esophagogastric landmarks identified. - Normal gastroesophageal junction and esophagus.  - The entire examined colon is normal. Biopsied. - The distal rectum and anal verge are normal on retroflexion view.  DIAGNOSIS:  A. DUODENUM; COLD BIOPSY:  - DUODENAL MUCOSA WITH NO SIGNIFICANT PATHOLOGIC ALTERATION.  - NEGATIVE FOR FEATURES OF CELIAC DISEASE.  - NEGATIVE FOR DYSPLASIA AND MALIGNANCY.   B. STOMACH, RANDOM; COLD BIOPSY:  - ANTRAL MUCOSA WITH MILD CHRONIC INFLAMMATION AND REACTIVE CHANGES.  - OXYNTIC MUCOSA WITH NO SIGNIFICANT PATHOLOGIC ALTERATION.  - NEGATIVE FOR ACTIVE INFLAMMATION AND H PYLORI.  - NEGATIVE FOR INTESTINAL METAPLASIA, DYSPLASIA, AND MALIGNANCY.   C. COLON, RANDOM; COLD BIOPSY:  - COLONIC MUCOSA WITH NO SIGNIFICANT PATHOLOGIC ALTERATION.  - NEGATIVE FOR MICROSCOPIC COLITIS, DYSPLASIA, AND MALIGNANCY.  Past Medical History:  Diagnosis  Date  . Anxiety   . Arthritis    DDD-NECK  . COVID-19 11/04/2019  . Depression   . GERD (gastroesophageal reflux disease)   . Headache    MIGRAINES  . Hypertension    H/O WAS ON FUROSEMIDE IN THE PAST BUT  LOST WEIGHT AND WAS TAKEN OFF DUE TO BP CONTROL  . Pneumonia due to COVID-19 virus 11/03/2019  . Pre-diabetes   . Sleep apnea    CPAP    Past Surgical History:  Procedure Laterality Date  . BREAST BIOPSY Right 2012   benign  . CHOLECYSTECTOMY  04/17/2017   Procedure: CHOLECYSTECTOMY;  Surgeon: Robert Bellow, MD;  Location: [facility] ORS;  Service: General;;  Laparoscopic converted to open  . COLONOSCOPY WITH PROPOFOL N/A 07/28/2020   Procedure: COLONOSCOPY WITH PROPOFOL;  Surgeon: Lin Landsman, MD;  Location: [facility] ENDOSCOPY;  Service: Gastroenterology;  Laterality: N/A;  . ERCP N/A 04/13/2017   Procedure: ENDOSCOPIC RETROGRADE CHOLANGIOPANCREATOGRAPHY (ERCP);  Surgeon: Lucilla Lame, MD;  Location: [facility] ENDOSCOPY;  Service: Endoscopy;  Laterality: N/A;  . ERCP N/A 06/20/2017   Procedure: ENDOSCOPIC RETROGRADE CHOLANGIOPANCREATOGRAPHY (ERCP) STENT REMOVAL;  Surgeon: Lucilla Lame, MD;  Location: [facility] ENDOSCOPY;  Service: Endoscopy;  Laterality: N/A;  . ESOPHAGOGASTRODUODENOSCOPY (EGD) WITH PROPOFOL N/A 07/28/2020   Procedure: ESOPHAGOGASTRODUODENOSCOPY (EGD) WITH PROPOFOL;  Surgeon: Lin Landsman, MD;  Location: [facility] ENDOSCOPY;  Service: Gastroenterology;  Laterality: N/A;  . TONSILLECTOMY AND ADENOIDECTOMY  1972    Current Outpatient Medications:  .  ALPRAZolam (XANAX) 0.5 MG tablet, Take 0.5-1 tablets (0.25-0.5 mg total) by mouth every 4 (four) hours as needed for anxiety., Disp: 30 tablet, Rfl: 2 .  amitriptyline (ELAVIL) 25 MG tablet, Take 2 tablets (50 mg total) by mouth at bedtime., Disp: 60 tablet, Rfl: 2 .  colestipol (COLESTID) 1 g tablet, TAKE 1 TABLET (1 G TOTAL) BY MOUTH 2 (TWO) TIMES DAILY. (Patient taking differently: Take 2 g by mouth 3 (three) times daily.), Disp: 120 tablet, Rfl: 1 .  omeprazole (PRILOSEC) 20 MG capsule, TAKE 1 CAPSULE BY MOUTH EVERY DAY, Disp: 90 capsule, Rfl: 0 .  rifaximin (XIFAXAN) 550 MG TABS tablet, Take 1 tablet (550 mg total) by mouth 3  (three) times daily for 14 days., Disp: 42 tablet, Rfl: 0 .  sertraline (ZOLOFT) 100 MG tablet, Take 1 tablet (100 mg total) by mouth 2 (two) times daily., Disp: 180 tablet, Rfl: 1   Family History  Problem Relation Age of Onset  . Hyperlipidemia Father   . Depression Sister   . Bipolar disorder Brother   . Seizures Brother   . Congestive Heart Failure Maternal Grandfather   . Dementia Paternal Grandfather   . Depression Sister   . Depression Mother   . Arthritis Mother   . Emphysema Mother   . Congestive Heart Failure Mother   . Arthritis Maternal Grandmother   . Asthma Maternal Grandmother   . Breast cancer Neg Hx   . Ovarian cancer Neg Hx   . Colon cancer Neg Hx      Social History   Tobacco Use  . Smoking status: Former Smoker    Packs/day: 0.50    Years: 20.00    Pack years: 10.00    Types: Cigarettes    Quit date: 11/13/2005    Years since quitting: 15.1  . Smokeless tobacco: Never Used  Vaping Use  . Vaping Use: Never used  Substance Use Topics  . Alcohol use: Not Currently    Comment: occasional   .  Drug use: No    Allergies as of 12/23/2020 - Review Complete 12/23/2020  Allergen Reaction Noted  . Codeine Hives 04/16/2015  . Other Hives and Itching 04/11/2017  . Sulfa antibiotics Hives and Itching 04/16/2015  . Tramadol Other (See Comments) 04/11/2017  . Penicillins Rash 04/16/2015    Review of Systems:    All systems reviewed and negative except where noted in HPI.   Physical Exam:  BP 113/74 (BP Location: Left Arm, Patient Position: Sitting, Cuff Size: Large)   Pulse 87   Temp 97.8 F (36.6 C) (Oral)   Ht 5\' 2"  (1.575 m)   Wt 289 lb 6 oz (131.3 kg)   BMI 52.93 kg/m  No LMP recorded. (Menstrual status: Perimenopausal).  General:   Alert,  Well-developed, well-nourished, pleasant and cooperative in NAD Head:  Normocephalic and atraumatic. Eyes:  Sclera clear, no icterus.   Conjunctiva pink. Ears:  Normal auditory acuity. Nose:  No  deformity, discharge, or lesions. Mouth:  No deformity or lesions,oropharynx pink & moist. Neck:  Supple; no masses or thyromegaly. Lungs:  Respirations even and unlabored.  Clear throughout to auscultation.   No wheezes, crackles, or rhonchi. No acute distress. Heart:  Regular rate and rhythm; no murmurs, clicks, rubs, or gallops. Abdomen:  Normal bowel sounds. Soft, morbidly obese, non-tender and distended, tympanic to percussion without masses, hepatosplenomegaly or hernias noted.  No guarding or rebound tenderness.   Rectal: Not performed Msk:  Symmetrical without gross deformities. Good, equal movement & strength bilaterally. Pulses:  Normal pulses noted. Extremities:  No clubbing or edema.  No cyanosis. Neurologic:  Alert and oriented x3;  grossly normal neurologically. Skin:  Intact without significant lesions or rashes. No jaundice. Psych:  Alert and cooperative. Normal mood and affect.  Imaging Studies: No abdominal imaging  Assessment and Plan:   Sandra Wilkins is a 55 y.o. Caucasian female with morbid obesity, BMI 52.93, s/p cholecystectomy secondary to symptomatic cholelithiasis and choledocholithiasis in 2018 is seen in for follow-up of chronic nonbloody diarrhea, abdominal bloating, postprandial urgency without any other constitutional signs or symptoms.  Upper endoscopy and colonoscopy including biopsies were unremarkable.  No evidence of celiac disease, microscopic colitis or IBD.  Stool studies were negative for infection including C. difficile  Chronic diarrhea: Check pancreatic fecal elastase levels Empiric trial of rifaximin 550 mg 3 times daily for bacterial overgrowth, if this does not work, will try pancreatic enzymes Continue colestipol 2 g 3 times daily Increase amitriptyline to 50 mg at bedtime  Fatty liver Recheck LFTs today   Follow up in 6 weeks   Cephas Darby, MD

## 2020-12-24 ENCOUNTER — Encounter: Payer: Self-pay | Admitting: Gastroenterology

## 2020-12-24 LAB — HEPATIC FUNCTION PANEL
ALT: 66 IU/L — ABNORMAL HIGH (ref 0–32)
AST: 47 IU/L — ABNORMAL HIGH (ref 0–40)
Albumin: 4.2 g/dL (ref 3.8–4.9)
Alkaline Phosphatase: 115 IU/L (ref 44–121)
Bilirubin Total: 0.5 mg/dL (ref 0.0–1.2)
Bilirubin, Direct: 0.13 mg/dL (ref 0.00–0.40)
Total Protein: 6.6 g/dL (ref 6.0–8.5)

## 2020-12-28 ENCOUNTER — Encounter: Payer: Self-pay | Admitting: Gastroenterology

## 2020-12-28 NOTE — Telephone Encounter (Signed)
Did PA for xifaxan

## 2020-12-30 ENCOUNTER — Telehealth: Payer: Self-pay

## 2020-12-30 NOTE — Telephone Encounter (Signed)
Please ask her to pick up Creon or Zenpep samples, 2 capsules with first bite of each meal and 1 with snack  RV

## 2020-12-30 NOTE — Telephone Encounter (Signed)
Patient verbalized understanding she will come and pick up samples of creon

## 2020-12-30 NOTE — Telephone Encounter (Signed)
Patient insurance company denied the [place]. Please advised what you recommend.

## 2020-12-31 ENCOUNTER — Ambulatory Visit (INDEPENDENT_AMBULATORY_CARE_PROVIDER_SITE_OTHER): Payer: 59 | Admitting: Family Medicine

## 2020-12-31 ENCOUNTER — Encounter: Payer: Self-pay | Admitting: Family Medicine

## 2020-12-31 ENCOUNTER — Other Ambulatory Visit: Payer: Self-pay

## 2020-12-31 VITALS — BP 110/59 | HR 96 | Temp 98.8°F | Resp 16 | Wt 288.8 lb

## 2020-12-31 DIAGNOSIS — R232 Flushing: Secondary | ICD-10-CM

## 2020-12-31 DIAGNOSIS — R159 Full incontinence of feces: Secondary | ICD-10-CM | POA: Diagnosis not present

## 2020-12-31 DIAGNOSIS — R152 Fecal urgency: Secondary | ICD-10-CM | POA: Diagnosis not present

## 2020-12-31 DIAGNOSIS — K529 Noninfective gastroenteritis and colitis, unspecified: Secondary | ICD-10-CM

## 2020-12-31 LAB — PANCREATIC ELASTASE, FECAL: Pancreatic Elastase, Fecal: 197 ug Elast./g — ABNORMAL LOW (ref >200)

## 2020-12-31 NOTE — Progress Notes (Signed)
Established patient visit   Patient: Sandra Wilkins   DOB: 1965/12/07   55 y.o. Female  MRN: [id number] Visit Date: 12/31/2020  Today's healthcare provider: Lavon Paganini, MD   Chief Complaint  Patient presents with  . Diarrhea   Subjective    Diarrhea  This is a chronic problem. The current episode started more than 1 month ago. The problem occurs 5 to 10 times per day. The problem has been unchanged. The stool consistency is described as watery. The patient states that diarrhea does not awaken her from sleep. Associated symptoms include abdominal pain, bloating and chills. The symptoms are aggravated by dairy products. There are no known risk factors.     Does not occur when eating carbs or chicken now.  Tried treatment (Xifaxin) for SIBO, but it was unaffordable - going to get sample from GI  Patient Active Problem List   Diagnosis Date Noted  . Bile salt-induced diarrhea 10/23/2020  . Incontinence of feces with fecal urgency 10/23/2020  . Mixed hyperlipidemia 08/28/2020  . Moderate episode of recurrent major depressive disorder ([place]) 08/28/2020  . Thrombocytopenia ([place]) 08/28/2020  . Difficult airway for intubation 07/28/2020  . Chronic diarrhea   . Ocular migraine 05/14/2018  . Plantar fasciitis 04/11/2018  . DDD (degenerative disc disease), cervical 08/22/2016  . Hyperuricemia 07/23/2016  . Arthralgia 07/22/2016  . Irritable bowel syndrome (IBS) 10/21/2015  . 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 Irregular menses 05/21/2015  . Arthritis 04/16/2015  . Acid reflux 04/16/2015  . Lower extremity edema 04/16/2015  . Pre-diabetes 04/16/2015  . Morbid obesity ([place]) 04/16/2015  . OSA (obstructive sleep apnea) 04/16/2015  . Anxiety disorder 09/05/2006   Social History   Tobacco Use  . Smoking status: Former Smoker    Packs/day: 0.50    Years: 20.00    Pack years: 10.00    Types: Cigarettes    Quit  date: 11/13/2005    Years since quitting: 15.1  . Smokeless tobacco: Never Used  Vaping Use  . Vaping Use: Never used  Substance Use Topics  . Alcohol use: Not Currently    Comment: occasional   . Drug use: No   Allergies  Allergen Reactions  . Codeine Hives  . Other Hives and Itching    Metronidazole or Ciprofloxacin    . Sulfa Antibiotics Hives and Itching  . Tramadol Other (See Comments)    Hallucinations  . Penicillins Rash    Has patient had a PCN reaction causing immediate rash, facial/tongue/throat swelling, SOB or lightheadedness with hypotension: Unknown Has patient had a PCN reaction causing severe rash involving mucus membranes or skin necrosis: Unknown Has patient had a PCN reaction that required hospitalization: Unknown Has patient had a PCN reaction occurring within the last 10 years: No If all of the above answers are "NO", then may proceed with Cephalosporin use.        Medications: Outpatient Medications Prior to Visit  Medication Sig  . ALPRAZolam (XANAX) 0.5 MG tablet Take 0.5-1 tablets (0.25-0.5 mg total) by mouth every 4 (four) hours as needed for anxiety.  Marland Kitchen amitriptyline (ELAVIL) 25 MG tablet Take 2 tablets (50 mg total) by mouth at bedtime.  . colestipol (COLESTID) 1 g tablet TAKE 1 TABLET (1 G TOTAL) BY MOUTH 2 (TWO) TIMES DAILY. (Patient taking differently: Take 2 g by mouth 3 (three) times daily.)  . omeprazole (PRILOSEC) 20 MG capsule TAKE 1 CAPSULE BY MOUTH EVERY DAY  .  sertraline (ZOLOFT) 100 MG tablet Take 1 tablet (100 mg total) by mouth 2 (two) times daily.  . rifaximin (XIFAXAN) 550 MG TABS tablet Take 1 tablet (550 mg total) by mouth 3 (three) times daily for 14 days. (Patient not taking: Reported on 12/31/2020)   No facility-administered medications prior to visit.    Review of Systems  Constitutional: Positive for chills.  Gastrointestinal: Positive for abdominal pain, bloating, diarrhea and nausea.    Last CBC Lab Results   Component Value Date   WBC 7.0 08/28/2020   HGB 14.6 08/28/2020   HCT 44.9 08/28/2020   MCV 87 08/28/2020   MCH 28.3 08/28/2020   RDW 13.4 08/28/2020   PLT 211 38/25/0539   Last metabolic panel Lab Results  Component Value Date   GLUCOSE 99 08/28/2020   NA 142 08/28/2020   K 3.5 08/28/2020   CL 104 08/28/2020   CO2 24 08/28/2020   BUN 12 08/28/2020   CREATININE 1.02 (H) 08/28/2020   GFRNONAA 63 08/28/2020   GFRAA 73 08/28/2020   CALCIUM 9.1 08/28/2020   PROT 6.6 12/23/2020   ALBUMIN 4.2 12/23/2020   LABGLOB 2.5 08/28/2020   AGRATIO 1.7 08/28/2020   BILITOT 0.5 12/23/2020   ALKPHOS 115 12/23/2020   AST 47 (H) 12/23/2020   ALT 66 (H) 12/23/2020   ANIONGAP 10 11/06/2019   Last lipids Lab Results  Component Value Date   CHOL 164 08/28/2020   HDL 40 08/28/2020   LDLCALC 97 08/28/2020   TRIG 152 (H) 08/28/2020   CHOLHDL 4.1 08/28/2020   Last hemoglobin A1c Lab Results  Component Value Date   HGBA1C 6.0 (H) 08/28/2020   Last thyroid functions Lab Results  Component Value Date   TSH 3.170 09/26/2019   T4TOTAL 6.3 07/22/2016       Objective    BP (!) 110/59 (BP Location: Left Arm, Patient Position: Sitting, Cuff Size: Large)   Pulse 96   Temp 98.8 F (37.1 C) (Oral)   Resp 16   Wt 288 lb 12.8 oz (131 kg)   SpO2 96%   BMI 52.82 kg/m  BP Readings from Last 3 Encounters:  12/31/20 (!) 110/59  12/23/20 113/74  10/23/20 134/89   Wt Readings from Last 3 Encounters:  12/31/20 288 lb 12.8 oz (131 kg)  12/23/20 289 lb 6 oz (131.3 kg)  10/23/20 284 lb 8 oz (129 kg)       Physical Exam Constitutional:      General: She is not in acute distress.    Appearance: Normal appearance. She is not diaphoretic.  HENT:     Head: Normocephalic.  Eyes:     Conjunctiva/sclera: Conjunctivae normal.  Cardiovascular:     Rate and Rhythm: Normal rate and regular rhythm.     Heart sounds: Normal heart sounds. No murmur heard.   Pulmonary:     Effort: Pulmonary  effort is normal. No respiratory distress.     Breath sounds: Normal breath sounds. No wheezing.  Abdominal:     General: There is no distension.     Palpations: Abdomen is soft.     Tenderness: There is no abdominal tenderness.  Musculoskeletal:     Right lower leg: No edema.     Left lower leg: No edema.  Skin:    Findings: No rash.  Neurological:     Mental Status: She is alert and oriented to person, place, and time.       No results found for any visits on  12/31/20.  Assessment & Plan     Problem List Items Addressed This Visit      Digestive   Chronic diarrhea - Primary    Continue workup with GI Think Xifaxin may be beneficial if she can get coverage STD paperwork completed today      Relevant Orders   Ambulatory referral to Allergy     Other   Incontinence of feces with fecal urgency    GI f/u as above Completed STD forms today Patient is concerned about [employer], but doubt given current symptoms are very GI focused without other systemic symptoms Will refer to allergy for eval       Other Visit Diagnoses    Flushing       Relevant Orders   Ambulatory referral to Allergy       Return in about 3 months (around 03/30/2021) for CPE.     Total time spent on today's visit was greater than 30 minutes, including both face-to-face time and nonface-to-face time personally spent on review of chart (labs and imaging), discussing labs and goals, discussing further work-up, treatment options, referrals to specialist if needed, reviewing outside records of pertinent, answering patient's questions, and coordinating care.    I, Lavon Paganini, MD, have reviewed all documentation for this visit. The documentation on 12/31/20 for the exam, diagnosis, procedures, and orders are all accurate and complete.   Natasia Sanko, Dionne Bucy, MD, MPH [facility]

## 2020-12-31 NOTE — Assessment & Plan Note (Signed)
GI f/u as above Completed STD forms today Patient is concerned about [employer], but doubt given current symptoms are very GI focused without other systemic symptoms Will refer to allergy for eval

## 2020-12-31 NOTE — Assessment & Plan Note (Signed)
Continue workup with GI Think Xifaxin may be beneficial if she can get coverage STD paperwork completed today

## 2021-01-01 ENCOUNTER — Telehealth: Payer: Self-pay

## 2021-01-01 ENCOUNTER — Telehealth: Payer: Self-pay | Admitting: Gastroenterology

## 2021-01-01 ENCOUNTER — Encounter: Payer: Self-pay | Admitting: Family Medicine

## 2021-01-01 NOTE — Telephone Encounter (Signed)
-----   Message from Lin Landsman, MD sent at 01/01/2021  9:58 AM EST ----- The stool test for pancreatic insufficiency does confirm that she has pancreatic insufficiency.  Recommend trial of pancreatic enzymes, Zenpep or Creon, start taking 2 capsules with first bite of each meal and 1 with snack and let me know if it is helping.  Sent her message in my chart, it looks like she did not view the results yet  Rohini Vanga

## 2021-01-01 NOTE — Telephone Encounter (Signed)
[employer] with [employer] called- needs prior auth  And additional information for  Xifaxan.    She can be reached at [phone] Ref # [facility]

## 2021-01-01 NOTE — Telephone Encounter (Signed)
Called patient and left a message for patient. Left a detail message to review mychart

## 2021-01-01 NOTE — Telephone Encounter (Signed)
Medication got denied and we put patient on Creon

## 2021-01-19 ENCOUNTER — Other Ambulatory Visit: Payer: Self-pay | Admitting: Gastroenterology

## 2021-01-19 DIAGNOSIS — R519 Headache, unspecified: Secondary | ICD-10-CM

## 2021-01-19 DIAGNOSIS — K580 Irritable bowel syndrome with diarrhea: Secondary | ICD-10-CM

## 2021-01-19 NOTE — Telephone Encounter (Signed)
Last office visit 12/23/2020 IBS  Last refill 12/23/2020 2 refills patient wants 90 day supply  Has appointment 02/23/2021

## 2021-01-27 ENCOUNTER — Telehealth: Payer: Self-pay

## 2021-01-28 NOTE — Progress Notes (Signed)
Opened in error.  Pattricia Boss, [facility] Pharmacist Assistant [phone]

## 2021-01-29 ENCOUNTER — Other Ambulatory Visit: Payer: Self-pay | Admitting: Family Medicine

## 2021-01-29 DIAGNOSIS — R519 Headache, unspecified: Secondary | ICD-10-CM

## 2021-02-02 NOTE — Telephone Encounter (Signed)
Ok to send in lasix 20mg  daily and kdur 7meq daily to take with it. Check BMP in 2 weeks after starting.  If one leg is swelling more than the other and if it is painful, needs to be evaluated to rule out DVT.

## 2021-02-05 ENCOUNTER — Telehealth: Payer: Self-pay

## 2021-02-05 MED ORDER — POTASSIUM CHLORIDE CRYS ER 20 MEQ PO TBCR: 20.0000 meq | EXTENDED_RELEASE_TABLET | Freq: Every day | ORAL | 3 refills | Status: DC

## 2021-02-05 MED ORDER — FUROSEMIDE 20 MG PO TABS: 20.0000 mg | ORAL_TABLET | Freq: Every day | ORAL | 3 refills | Status: DC

## 2021-02-05 NOTE — Telephone Encounter (Signed)
Medication sent.

## 2021-02-18 ENCOUNTER — Encounter (INDEPENDENT_AMBULATORY_CARE_PROVIDER_SITE_OTHER): Payer: Self-pay

## 2021-02-23 ENCOUNTER — Encounter: Payer: Self-pay | Admitting: Gastroenterology

## 2021-02-23 ENCOUNTER — Other Ambulatory Visit: Payer: Self-pay | Admitting: Gastroenterology

## 2021-02-23 ENCOUNTER — Telehealth (INDEPENDENT_AMBULATORY_CARE_PROVIDER_SITE_OTHER): Payer: 59 | Admitting: Gastroenterology

## 2021-02-23 DIAGNOSIS — K529 Noninfective gastroenteritis and colitis, unspecified: Secondary | ICD-10-CM | POA: Diagnosis not present

## 2021-02-23 DIAGNOSIS — K8681 Exocrine pancreatic insufficiency: Secondary | ICD-10-CM | POA: Diagnosis not present

## 2021-02-23 MED ORDER — PANCRELIPASE (LIP-PROT-AMYL) 36000-114000 UNITS PO CPEP: ORAL_CAPSULE | ORAL | 1 refills | Status: DC

## 2021-02-23 NOTE — Patient Instructions (Addendum)
Your Ct scan is scheduled for 03/12/2021 8:30am at out patient imaging. Please arrive at 8:15am. Nothing to eat or drink after midnight. Please pick up contrast before your scan. Out patient imaging address is [address] [place], [place] [address]

## 2021-02-23 NOTE — Progress Notes (Signed)
Sandra Sear, MD [address]  [place]  [place], [place] [address]  Main: [phone]  Fax: [phone]    Gastroenterology Consultation Video Visit  Referring Provider:     Virginia Crews, MD Primary Care Physician:  Virginia Crews, MD Primary Gastroenterologist:  Dr. Cephas Darby Reason for Consultation:     Exocrine pancreatic insufficiency        HPI:   Sandra Wilkins is a 55 y.o. female referred by Dr. 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   I discussed the limitations, risks, security and privacy concerns of performing an evaluation and management service by video and the availability of in person appointments. I also discussed with the patient that there may be a patient responsible charge related to this service. The patient expressed understanding and agreed to proceed.  Location of the Patient: Home  Location of the provider: Office  Persons participating in the visit: Patient and provider only   History of Present Illness: Patient has been undergoing work-up of chronic diarrhea.  She underwent bidirectional endoscopy with biopsies which were unremarkable for celiac, H. pylori infection, IBD, microscopic colitis.  Her stool studies for pancreatic insufficiency came back abnormal.  I started her on Creon samples.  However, patient did not start taking these until recently due to family member being hospitalized and needing her support.  She notices that her fecal urgency has almost resolved.  However, she does acknowledge that she has not been taking Creon as directed, is taking inadequate dose. She also notices pain in her left lower leg, seeing her PCP tomorrow  NSAIDs: None  Antiplts/Anticoagulants/Anti  thrombotics: None  GI Procedures:  Patient did not undergo EGD or colonoscopy in the past patient underwent ERCP 04/13/2017 secondary to choledocholithiasis, underwent biliary sphincterotomy and temporary biliary stent placement with removal in 06/2017  EGD and colonoscopy 07/28/2020 - Normal examined duodenum. Biopsied. - Normal stomach. Biopsied. - Esophagogastric landmarks identified. - Normal gastroesophageal junction and esophagus.  - The entire examined colon is normal. Biopsied. - The distal rectum and anal verge are normal on retroflexion view.  DIAGNOSIS:  A. DUODENUM; COLD BIOPSY:  - DUODENAL MUCOSA WITH NO SIGNIFICANT PATHOLOGIC ALTERATION.  - NEGATIVE FOR FEATURES OF CELIAC DISEASE.  - NEGATIVE FOR DYSPLASIA AND MALIGNANCY.   B. STOMACH, RANDOM; COLD BIOPSY:  - ANTRAL MUCOSA WITH MILD CHRONIC INFLAMMATION AND REACTIVE CHANGES.  - OXYNTIC MUCOSA WITH NO SIGNIFICANT PATHOLOGIC ALTERATION.  - NEGATIVE FOR ACTIVE INFLAMMATION AND H PYLORI.  - NEGATIVE FOR INTESTINAL METAPLASIA, DYSPLASIA, AND MALIGNANCY.   C. COLON, RANDOM; COLD BIOPSY:  - COLONIC MUCOSA WITH NO SIGNIFICANT PATHOLOGIC ALTERATION.  - NEGATIVE FOR MICROSCOPIC COLITIS, DYSPLASIA, AND MALIGNANCY.   Past Medical History:  Diagnosis Date  . Anxiety   . Arthritis    DDD-NECK  . COVID-19 11/04/2019  . Depression   . GERD (gastroesophageal reflux disease)   . Headache    MIGRAINES  . Hypertension    H/O WAS ON FUROSEMIDE IN THE PAST BUT LOST WEIGHT AND WAS TAKEN OFF DUE TO BP CONTROL  . Pneumonia due to COVID-19 virus 11/03/2019  . Pre-diabetes   . Sleep apnea    CPAP    Past Surgical History:  Procedure Laterality Date  .  BREAST BIOPSY Right 2012   benign  . CHOLECYSTECTOMY  04/17/2017   Procedure: CHOLECYSTECTOMY;  Surgeon: Robert Bellow, MD;  Location: [facility] ORS;  Service: General;;  Laparoscopic converted to open  . COLONOSCOPY WITH PROPOFOL N/A 07/28/2020   Procedure: COLONOSCOPY WITH  PROPOFOL;  Surgeon: Lin Landsman, MD;  Location: [facility] ENDOSCOPY;  Service: Gastroenterology;  Laterality: N/A;  . ERCP N/A 04/13/2017   Procedure: ENDOSCOPIC RETROGRADE CHOLANGIOPANCREATOGRAPHY (ERCP);  Surgeon: Lucilla Lame, MD;  Location: [facility] ENDOSCOPY;  Service: Endoscopy;  Laterality: N/A;  . ERCP N/A 06/20/2017   Procedure: ENDOSCOPIC RETROGRADE CHOLANGIOPANCREATOGRAPHY (ERCP) STENT REMOVAL;  Surgeon: Lucilla Lame, MD;  Location: [facility] ENDOSCOPY;  Service: Endoscopy;  Laterality: N/A;  . ESOPHAGOGASTRODUODENOSCOPY (EGD) WITH PROPOFOL N/A 07/28/2020   Procedure: ESOPHAGOGASTRODUODENOSCOPY (EGD) WITH PROPOFOL;  Surgeon: Lin Landsman, MD;  Location: [facility] ENDOSCOPY;  Service: Gastroenterology;  Laterality: N/A;  . TONSILLECTOMY AND ADENOIDECTOMY  1972    Current Outpatient Medications:  .  ALPRAZolam (XANAX) 0.5 MG tablet, Take 0.5-1 tablets (0.25-0.5 mg total) by mouth every 4 (four) hours as needed for anxiety., Disp: 30 tablet, Rfl: 2 .  amitriptyline (ELAVIL) 25 MG tablet, TAKE 2 TABLETS BY MOUTH AT BEDTIME., Disp: 180 tablet, Rfl: 1 .  colestipol (COLESTID) 1 g tablet, TAKE 1 TABLET (1 G TOTAL) BY MOUTH 2 (TWO) TIMES DAILY. (Patient taking differently: Take 2 g by mouth 3 (three) times daily.), Disp: 120 tablet, Rfl: 1 .  furosemide (LASIX) 20 MG tablet, Take 1 tablet (20 mg total) by mouth daily., Disp: 30 tablet, Rfl: 3 .  lipase/protease/amylase (CREON) 36000 UNITS CPEP capsule, Take 2 capsules with the first bite of each meal and one capsule with the first bite of each snack, Disp: 240 capsule, Rfl: 1 .  omeprazole (PRILOSEC) 20 MG capsule, TAKE 1 CAPSULE BY MOUTH EVERY DAY, Disp: 90 capsule, Rfl: 0 .  potassium chloride SA (KLOR-CON) 20 MEQ tablet, Take 1 tablet (20 mEq total) by mouth daily., Disp: 30 tablet, Rfl: 3 .  sertraline (ZOLOFT) 100 MG tablet, Take 1 tablet (100 mg total) by mouth 2 (two) times daily., Disp: 180 tablet, Rfl: 1   Family History  Problem Relation  Age of Onset  . Hyperlipidemia Father   . Depression Sister   . Bipolar disorder Brother   . Seizures Brother   . Congestive Heart Failure Maternal Grandfather   . Dementia Paternal Grandfather   . Depression Sister   . Depression Mother   . Arthritis Mother   . Emphysema Mother   . Congestive Heart Failure Mother   . Arthritis Maternal Grandmother   . Asthma Maternal Grandmother   . Breast cancer Neg Hx   . Ovarian cancer Neg Hx   . Colon cancer Neg Hx      Social History   Tobacco Use  . Smoking status: Former Smoker    Packs/day: 0.50    Years: 20.00    Pack years: 10.00    Types: Cigarettes    Quit date: 11/13/2005    Years since quitting: 15.2  . Smokeless tobacco: Never Used  Vaping Use  . Vaping Use: Never used  Substance Use Topics  . Alcohol use: Not Currently    Comment: occasional   . Drug use: No    Allergies as of 02/23/2021 - Review Complete 02/23/2021  Allergen Reaction Noted  . Codeine Hives 04/16/2015  . Other Hives and Itching 04/11/2017  . Sulfa antibiotics Hives and Itching 04/16/2015  . Tramadol Other (See  Comments) 04/11/2017  . Penicillins Rash 04/16/2015     Imaging Studies: Reviewed  Assessment and Plan:   Sandra Wilkins is a 55 y.o. female with morbid obesity, BMI 52.93, s/p cholecystectomy secondary to symptomatic cholelithiasis and choledocholithiasis in 2018 is seen in for follow-up of chronic nonbloody diarrhea, abdominal bloating, postprandial urgency without any other constitutional signs or symptoms.  Upper endoscopy and colonoscopy including biopsies were unremarkable.  No evidence of celiac disease, microscopic colitis or IBD.  Stool studies were negative for infection including C. Difficile  Exocrine pancreatic insufficiency, pancreatic elastase levels were less than 200 Recommend CT pancreas protocol Reiterated on appropriate intake of Creon Recommend Creon 72K capsules with first bite of each meal 36K with  snack.    Follow Up Instructions:   I discussed the assessment and treatment plan with the patient. The patient was provided an opportunity to ask questions and all were answered. The patient agreed with the plan and demonstrated an understanding of the instructions.   The patient was advised to call back or seek an in-person evaluation if the symptoms worsen or if the condition fails to improve as anticipated.  I provided 16 minutes of face-to-face time during this encounter.   Follow up in 3 months   Cephas Darby, MD

## 2021-02-24 ENCOUNTER — Encounter: Payer: Self-pay | Admitting: Family Medicine

## 2021-02-24 ENCOUNTER — Telehealth: Payer: Self-pay

## 2021-02-24 ENCOUNTER — Ambulatory Visit (INDEPENDENT_AMBULATORY_CARE_PROVIDER_SITE_OTHER): Payer: 59 | Admitting: Family Medicine

## 2021-02-24 ENCOUNTER — Other Ambulatory Visit: Payer: Self-pay

## 2021-02-24 ENCOUNTER — Ambulatory Visit
Admission: RE | Admit: 2021-02-24 | Discharge: 2021-02-24 | Disposition: A | Discharge status: Home / Self Care | Payer: 59 | Source: Ambulatory Visit | Attending: Family Medicine | Admitting: Family Medicine

## 2021-02-24 ENCOUNTER — Ambulatory Visit
Admission: RE | Admit: 2021-02-24 | Discharge: 2021-02-24 | Disposition: A | Discharge status: Home / Self Care | Payer: 59 | Attending: Family Medicine | Admitting: Family Medicine

## 2021-02-24 VITALS — BP 132/87 | HR 80 | Temp 98.3°F | Resp 16 | Wt 301.0 lb

## 2021-02-24 DIAGNOSIS — M25562 Pain in left knee: Secondary | ICD-10-CM

## 2021-02-24 DIAGNOSIS — M79605 Pain in left leg: Secondary | ICD-10-CM

## 2021-02-24 DIAGNOSIS — R600 Localized edema: Secondary | ICD-10-CM | POA: Diagnosis not present

## 2021-02-24 DIAGNOSIS — M5442 Lumbago with sciatica, left side: Secondary | ICD-10-CM | POA: Diagnosis not present

## 2021-02-24 DIAGNOSIS — F411 Generalized anxiety disorder: Secondary | ICD-10-CM

## 2021-02-24 DIAGNOSIS — M79604 Pain in right leg: Secondary | ICD-10-CM

## 2021-02-24 IMAGING — CR DG KNEE COMPLETE 4+V*L*
1 series · 5 of 5 positions shown · non-contrast
Comparison: None.

CLINICAL DATA: Left knee pain. No known injury. Difficulty walking.

EXAM:
LEFT KNEE - COMPLETE 4+ VIEW

[Series 1: dg knee complete 4 views left · 0.14mm/px · 5 of 5 slices shown]
[im 1/5]
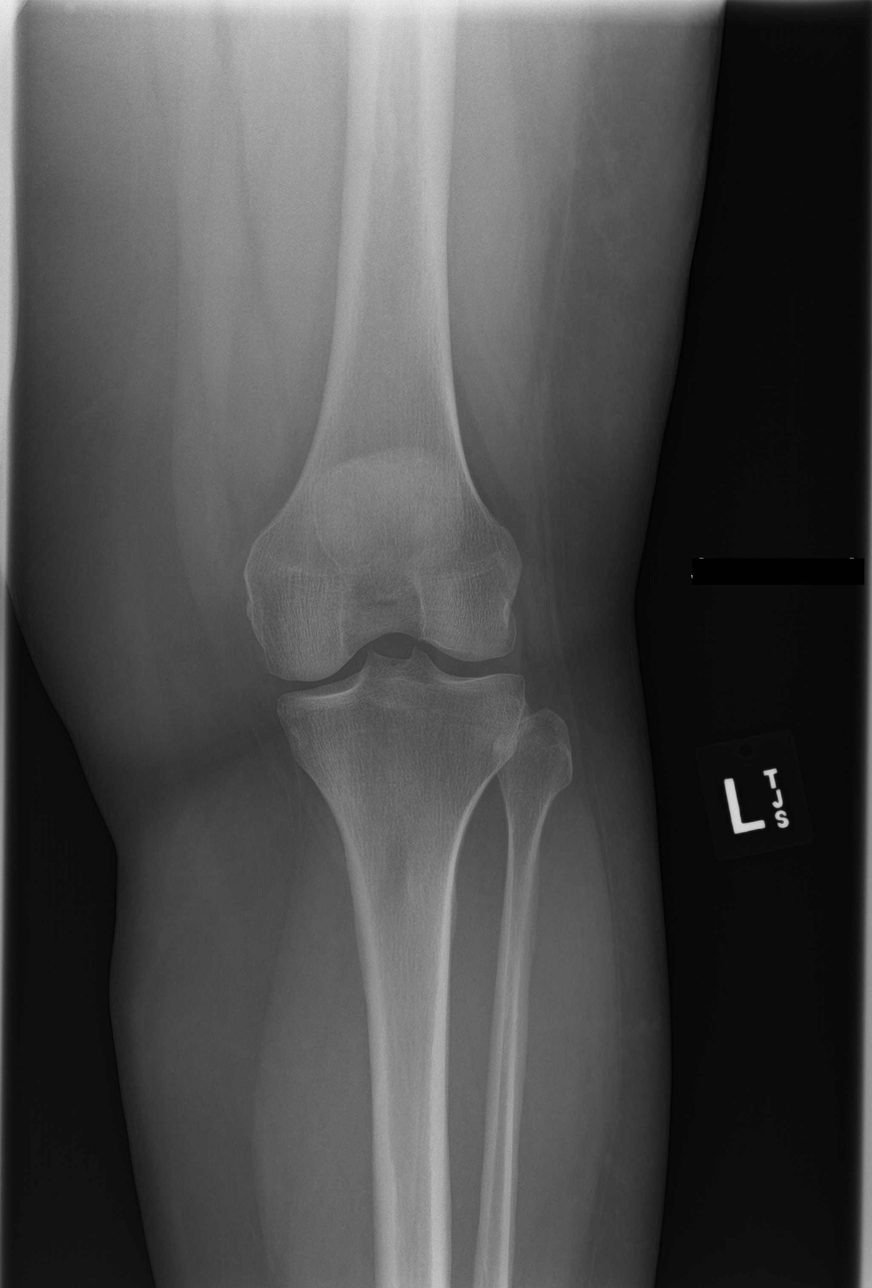
[im 2/5]
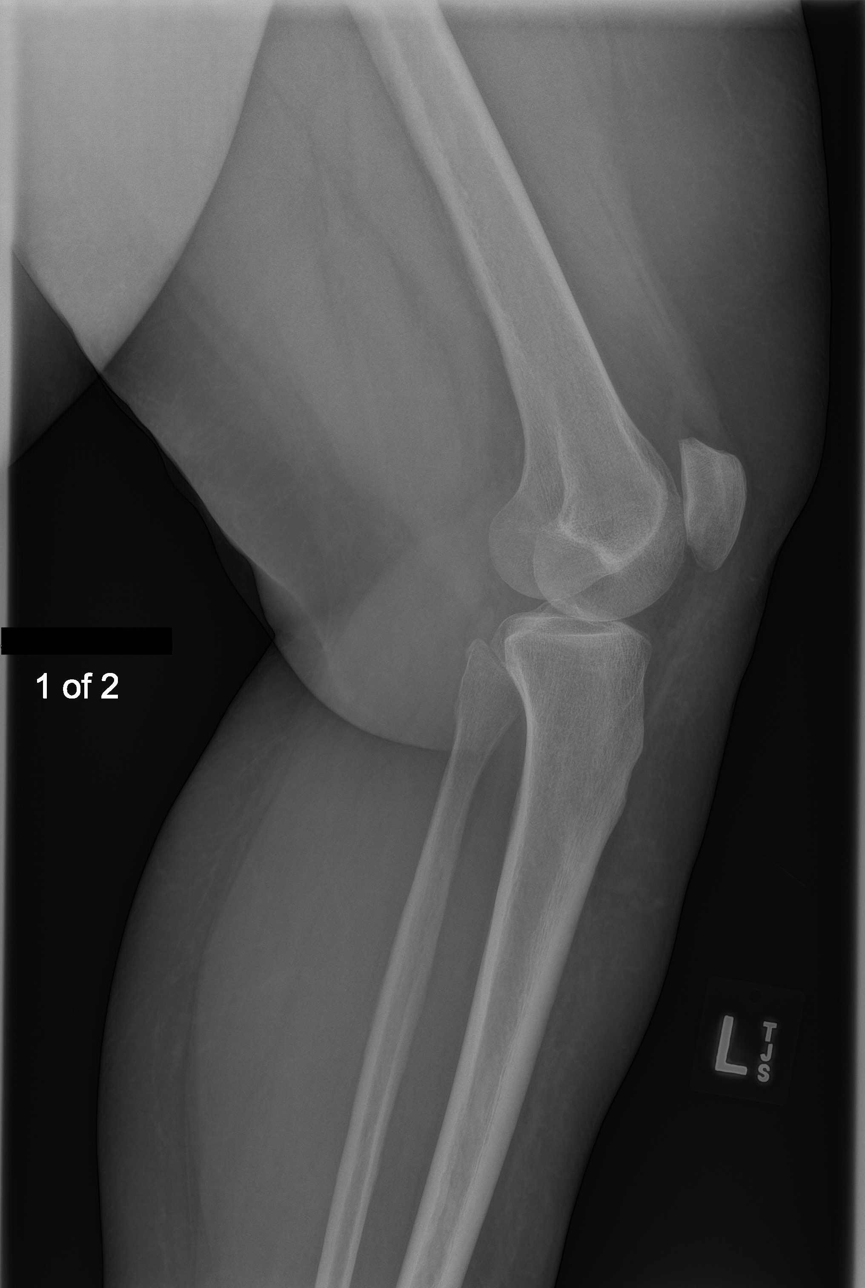
[im 3/5]
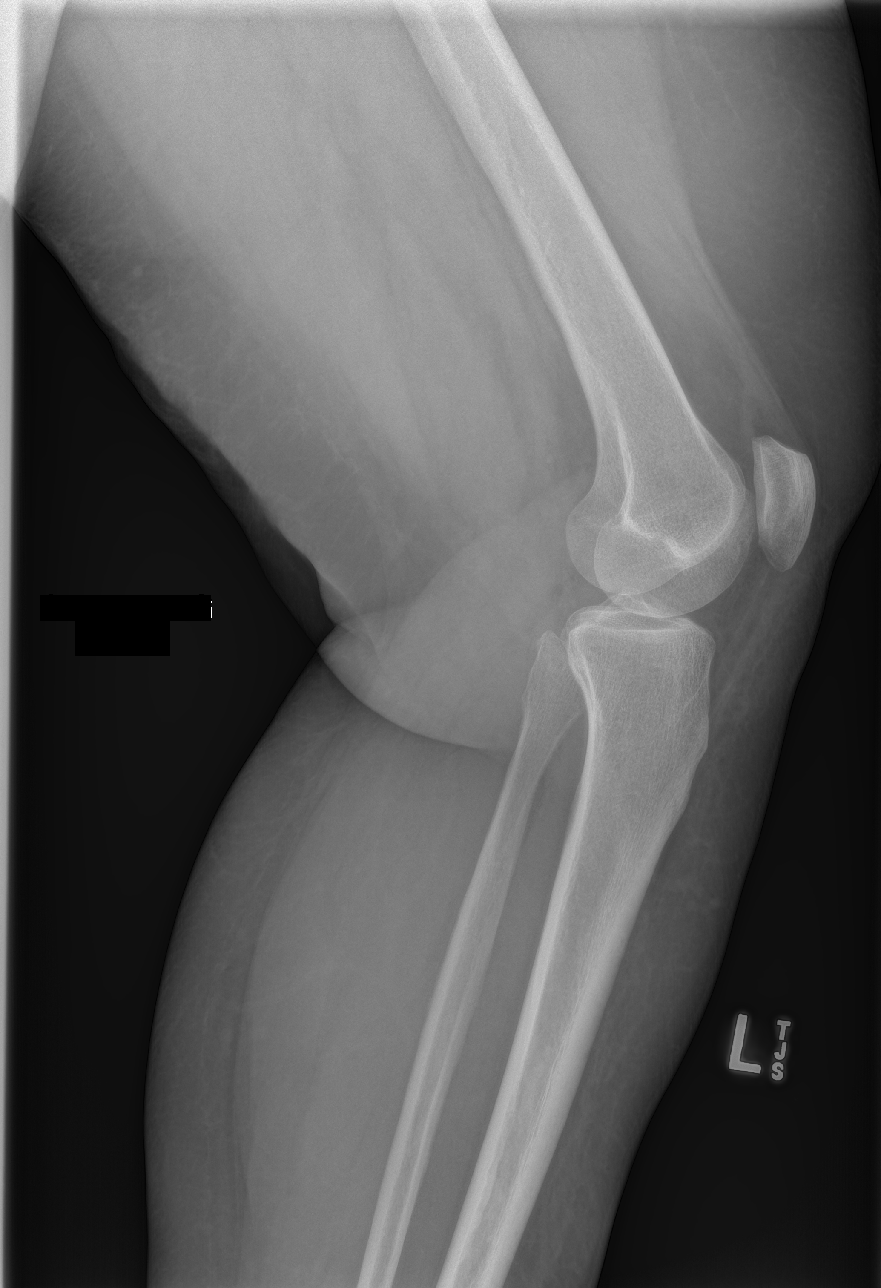
[im 4/5]
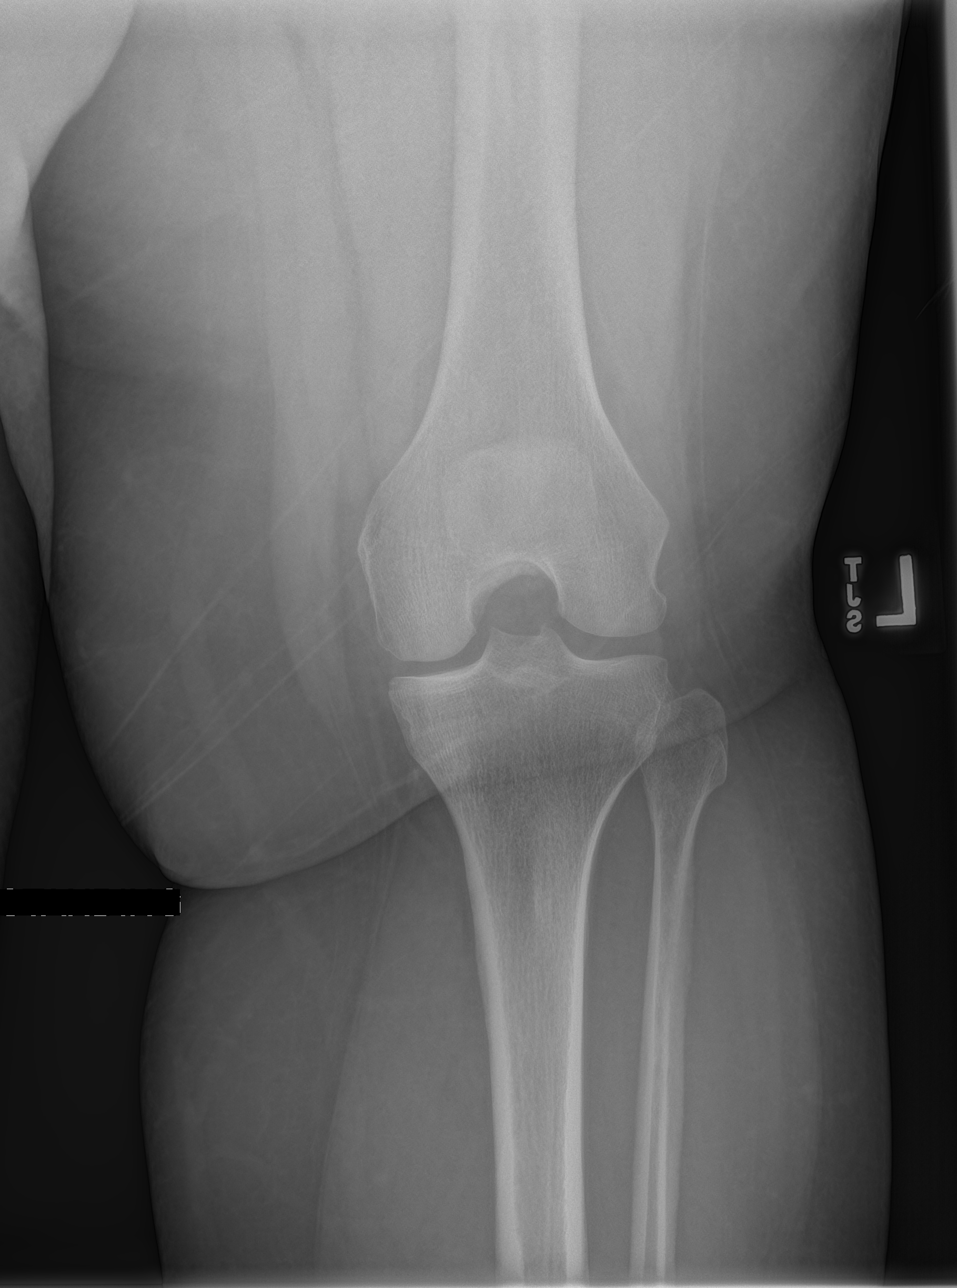
[im 5/5]
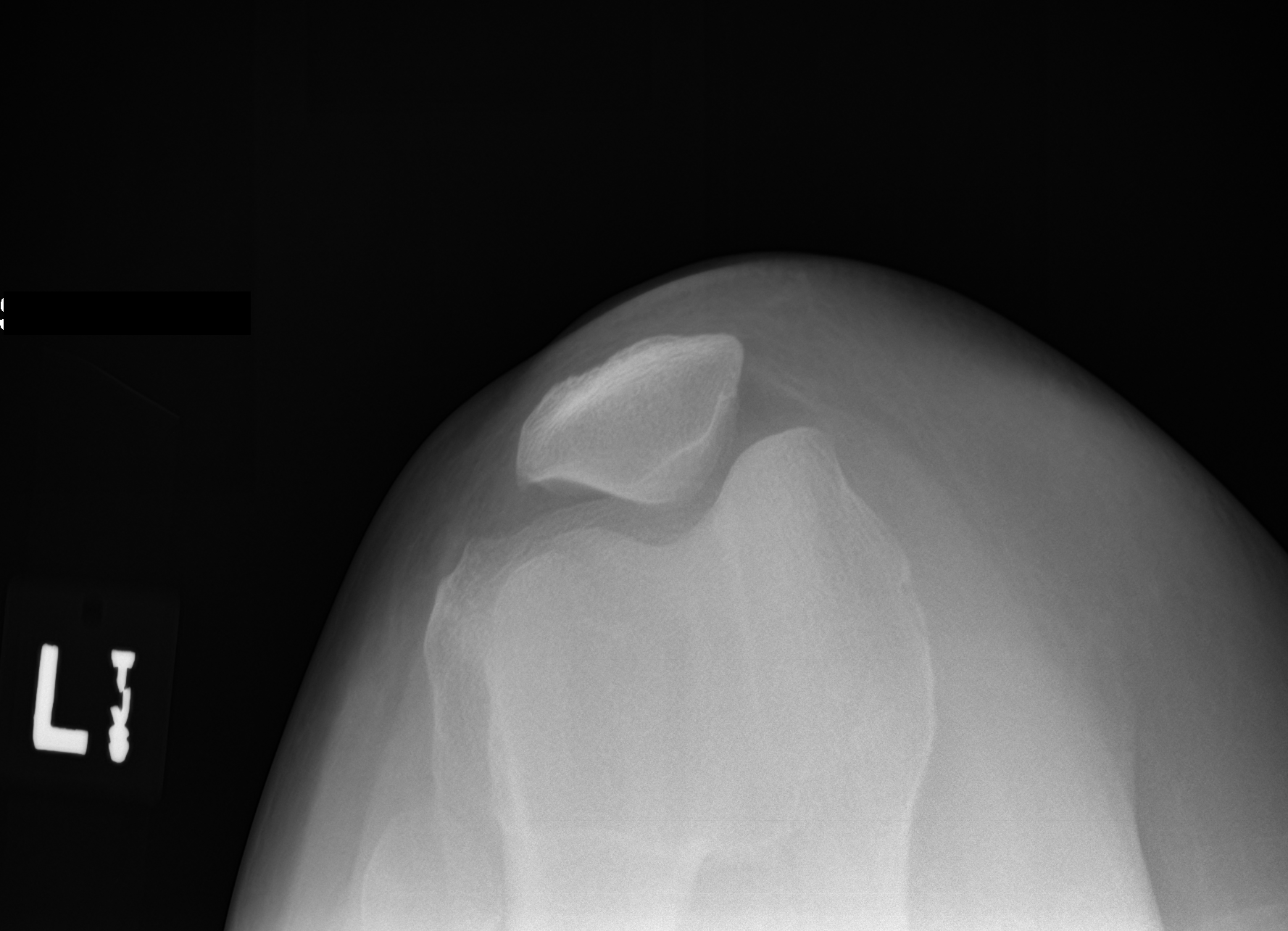

[5 of 5 positions shown; findings below may reference images not displayed]

FINDINGS: No fracture or dislocation. Joint spaces are preserved. No joint
effusion. No evidence of chondrocalcinosis. Regional soft tissues
appear normal.
IMPRESSION: Normal radiographs of the left knee.

## 2021-02-24 IMAGING — CR DG LUMBAR SPINE COMPLETE 4+V
1 series · 5 of 5 positions shown · non-contrast
Comparison: None.

CLINICAL DATA: Low back pain for the past 5-6 years. No known
injury.

EXAM:
LUMBAR SPINE - COMPLETE 4+ VIEW

[Series 1: dg lumbar spine complete 4 +v · 0.14mm/px · 5 of 5 slices shown]
[im 1/5]
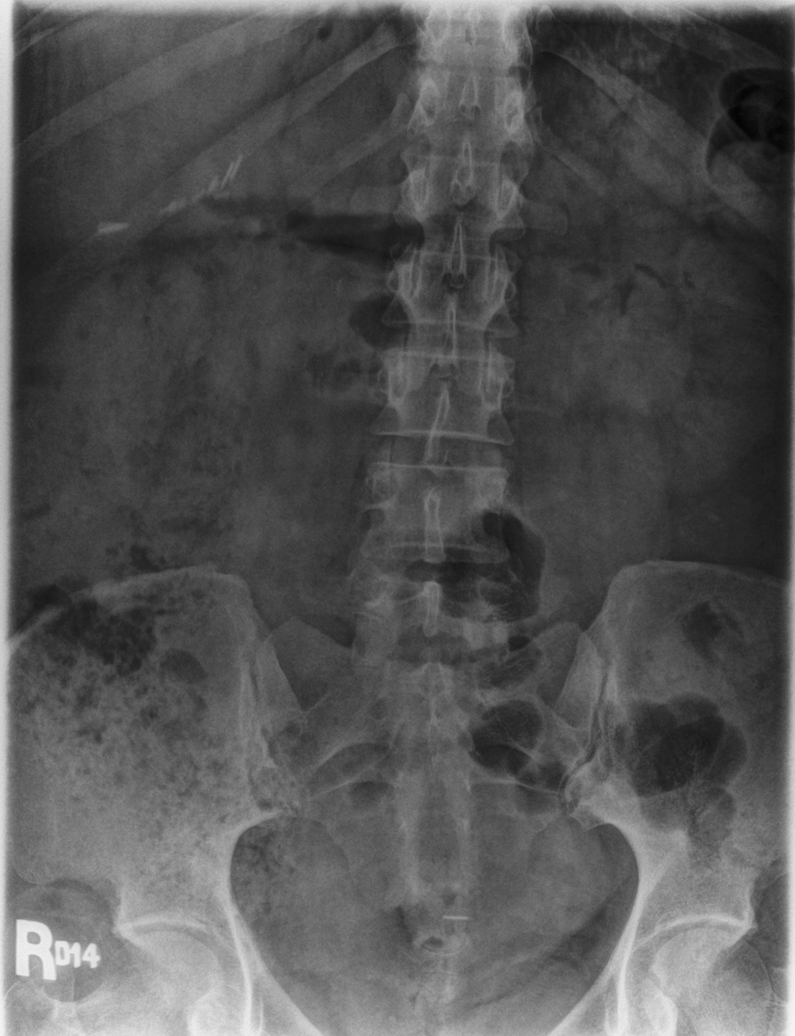
[im 2/5]
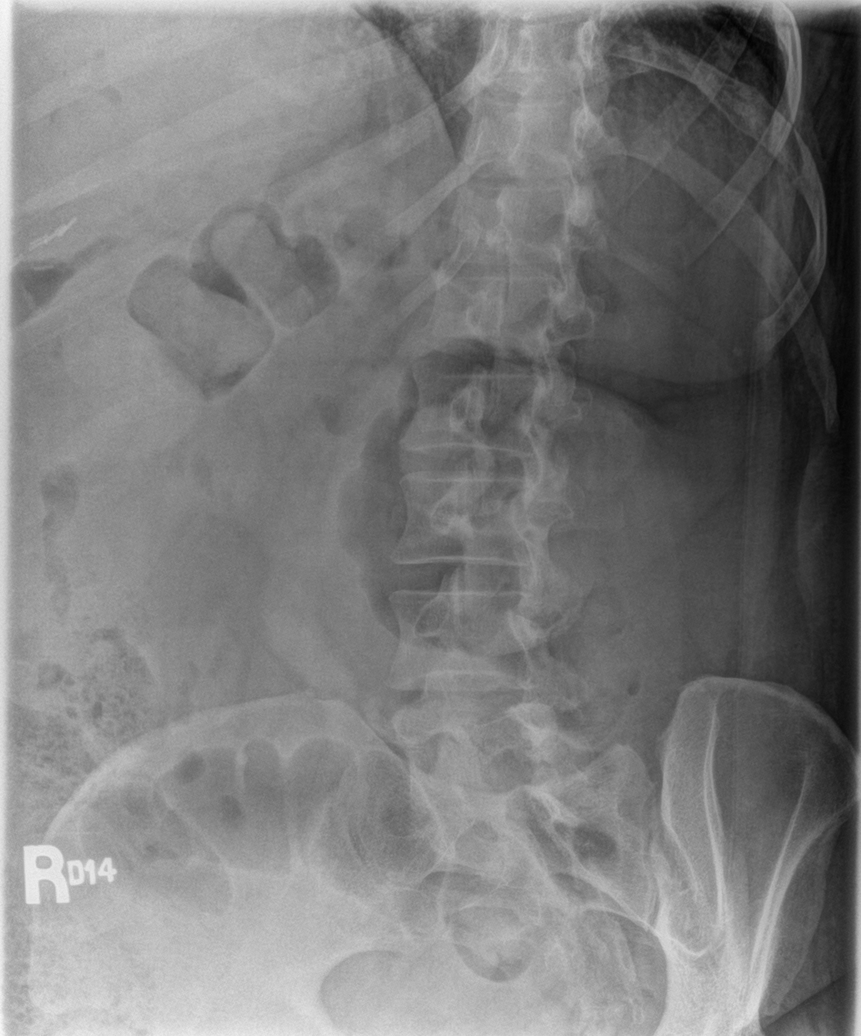
[im 3/5]
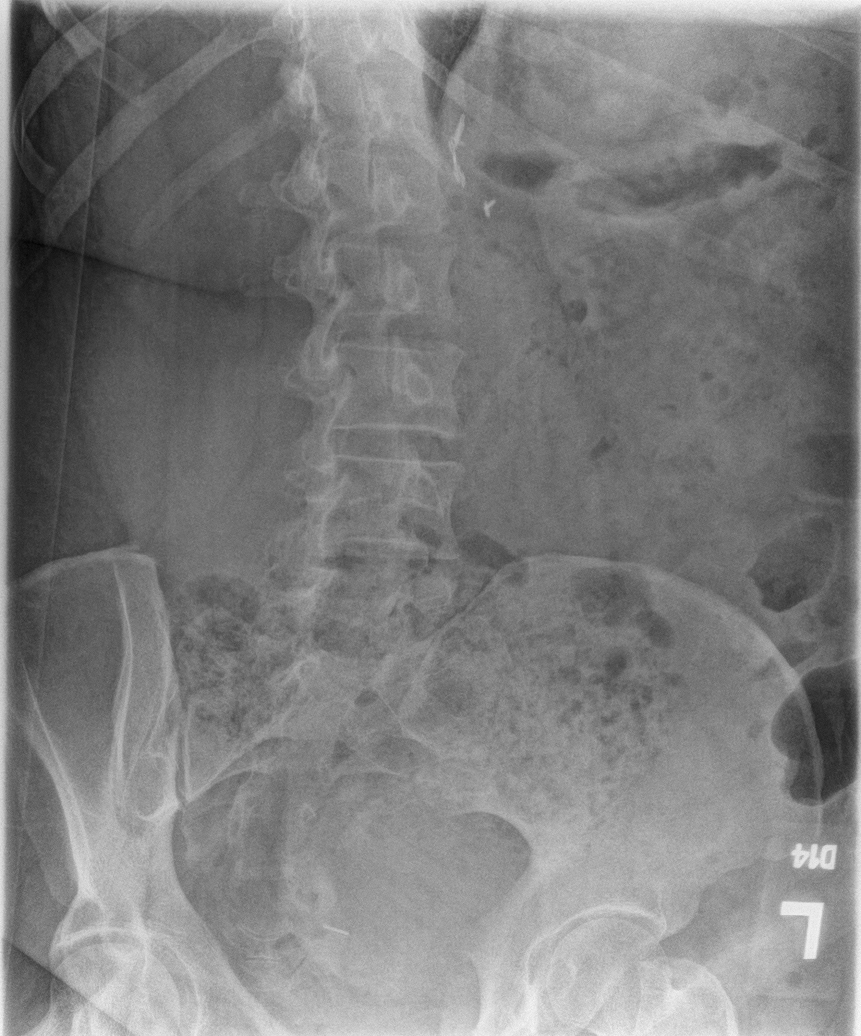
[im 4/5]
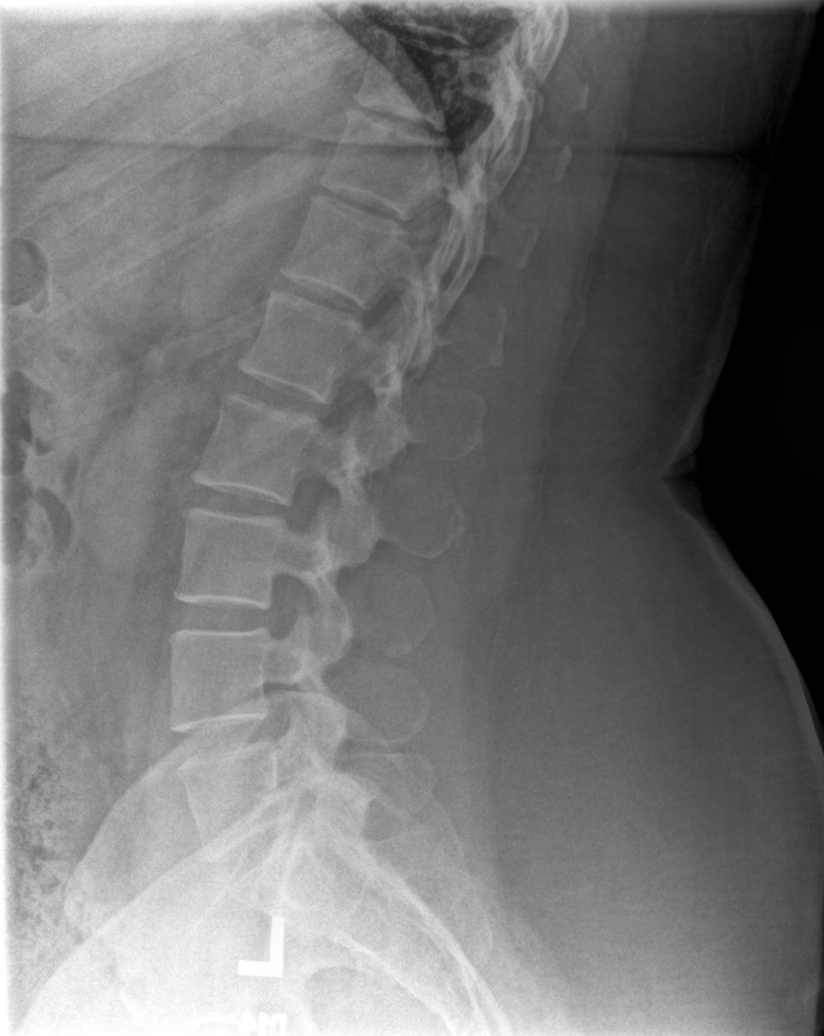
[im 5/5]
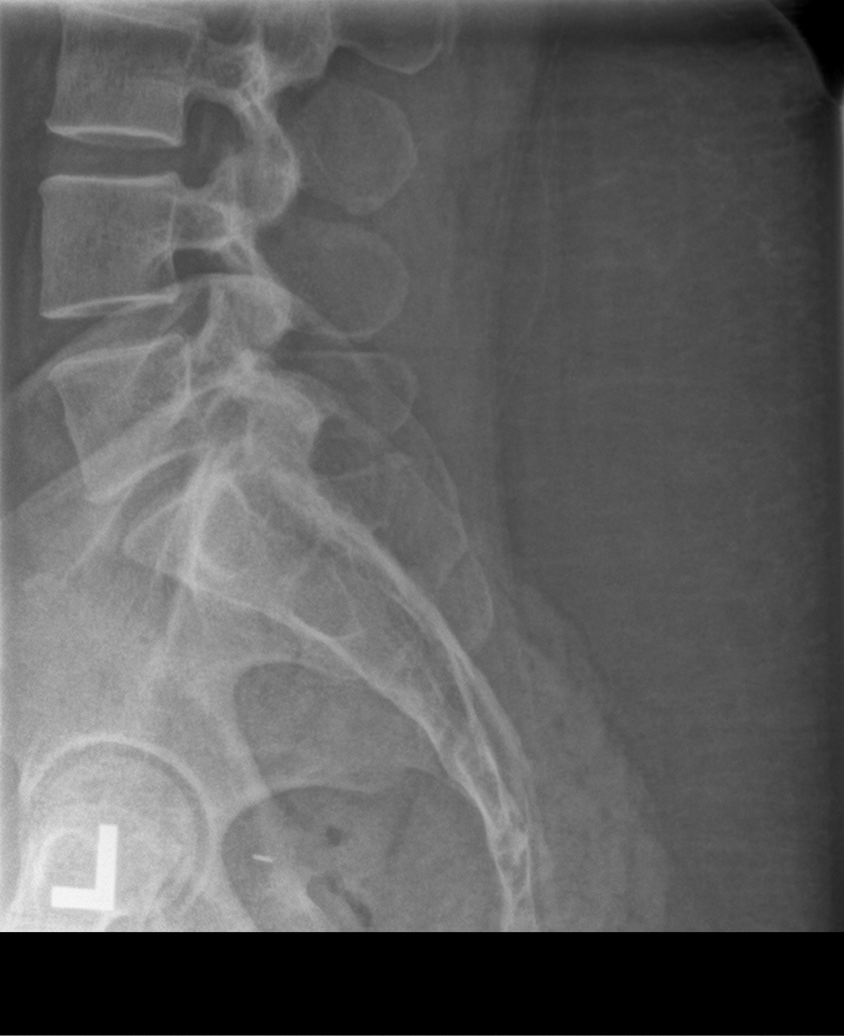

[5 of 5 positions shown; findings below may reference images not displayed]

FINDINGS: There are 5 non rib-bearing lumbar type vertebral bodies.

Normal alignment of the lumbar spine. No anterolisthesis or
retrolisthesis. No definite pars defects.

Lumbar vertebral body heights appear preserved.

Mild multilevel lumbar spine DDD, worse at L1-L2 and L2-L3 with disc
space height loss, endplate irregularity and sclerosis.

Limited visualization of the bilateral SI joints and hips is normal.

Large colonic stool burden without evidence of enteric obstruction.
Post cholecystectomy.
IMPRESSION: Mild multilevel lumbar spine DDD, worse at L1-L2 and L2-L3.

## 2021-02-24 MED ORDER — PREDNISONE 10 MG PO TABS: 10.0000 mg | ORAL_TABLET | Freq: Every day | ORAL | 0 refills | Status: DC

## 2021-02-24 MED ORDER — FUROSEMIDE 20 MG PO TABS: 40.0000 mg | ORAL_TABLET | Freq: Every day | ORAL | 3 refills | Status: DC

## 2021-02-24 NOTE — Progress Notes (Signed)
I,April Rodriguez,acting as a scribe for Wilhemena Durie, MD.,have documented all relevant documentation on the behalf of Wilhemena Durie, MD,as directed by  Wilhemena Durie, MD while in the presence of Wilhemena Durie, MD.  Established patient visit   Patient: Sandra Wilkins   DOB: 03/18/1966   55 y.o. Female  MRN: [id number] Visit Date: 02/24/2021  Today's healthcare provider: Wilhemena Durie, MD   Chief Complaint  Patient presents with  . Edema   Subjective    HPI  Patient is here concerning edema in her ankles and feet. Patient states she is also having pain in her left leg. Swelling increases as the day goes by. Patient states edema and pain are recurrent. Patient has been treating symptoms with furosemide and elevation.  She states she has been working from home for over 2 years.  She says she has gained a good bit of weight during that time. She has a CT of the abdomen pending per GI.  She complains of left leg pain and swelling and has leg pain with weightbearing.  She has known neck degenerative disc disease.  No falls and no numbness or weakness.     Medications: Outpatient Medications Prior to Visit  Medication Sig  . ALPRAZolam (XANAX) 0.5 MG tablet Take 0.5-1 tablets (0.25-0.5 mg total) by mouth every 4 (four) hours as needed for anxiety.  Marland Kitchen amitriptyline (ELAVIL) 25 MG tablet TAKE 2 TABLETS BY MOUTH AT BEDTIME.  . colestipol (COLESTID) 1 g tablet TAKE 1 TABLET (1 G TOTAL) BY MOUTH 2 (TWO) TIMES DAILY. (Patient taking differently: Take 2 g by mouth 3 (three) times daily.)  . furosemide (LASIX) 20 MG tablet Take 1 tablet (20 mg total) by mouth daily.  . lipase/protease/amylase (CREON) 36000 UNITS CPEP capsule Take 2 capsules with the first bite of each meal and one capsule with the first bite of each snack  . omeprazole (PRILOSEC) 20 MG capsule TAKE 1 CAPSULE BY MOUTH EVERY DAY  . potassium chloride SA (KLOR-CON) 20 MEQ tablet Take 1 tablet (20 mEq total)  by mouth daily.  . sertraline (ZOLOFT) 100 MG tablet Take 1 tablet (100 mg total) by mouth 2 (two) times daily.   No facility-administered medications prior to visit.    Review of Systems  Constitutional: Negative for appetite change, chills, fatigue and fever.  Respiratory: Negative for chest tightness and shortness of breath.   Cardiovascular: Negative for chest pain and palpitations.  Gastrointestinal: Negative for abdominal pain, nausea and vomiting.  Neurological: Negative for dizziness and weakness.        Objective    BP 132/87 (BP Location: Left Arm, Patient Position: Sitting, Cuff Size: Large)   Pulse 80   Temp 98.3 F (36.8 C) (Oral)   Resp 16   Wt (!) 301 lb (136.5 kg)   SpO2 96%   BMI 55.05 kg/m  BP Readings from Last 3 Encounters:  02/24/21 132/87  12/31/20 (!) 110/59  12/23/20 113/74   Wt Readings from Last 3 Encounters:  02/24/21 (!) 301 lb (136.5 kg)  12/31/20 288 lb 12.8 oz (131 kg)  12/23/20 289 lb 6 oz (131.3 kg)       Physical Exam Vitals reviewed.  Constitutional:      Appearance: She is obese.  HENT:     Head: Normocephalic and atraumatic.     Right Ear: External ear normal.     Left Ear: External ear normal.     Mouth/Throat:  Pharynx: Oropharynx is clear.  Eyes:     General: No scleral icterus.    Conjunctiva/sclera: Conjunctivae normal.  Cardiovascular:     Rate and Rhythm: Normal rate and regular rhythm.     Pulses: Normal pulses.     Heart sounds: Normal heart sounds.  Pulmonary:     Effort: Pulmonary effort is normal.     Breath sounds: Normal breath sounds.  Abdominal:     Palpations: Abdomen is soft.  Musculoskeletal:     Comments: Both right and left calf both measure 19.5 inches. Lower extremity exam otherwise benign. Appears to be lymphedema more than anything else.  Lymphadenopathy:     Cervical: No cervical adenopathy.  Skin:    General: Skin is warm and dry.  Neurological:     General: No focal deficit  present.     Mental Status: She is alert and oriented to person, place, and time.  Psychiatric:        Mood and Affect: Mood normal.        Thought Content: Thought content normal.        Judgment: Judgment normal.      Depression screen [facility] 2/9 02/24/2021 12/31/2020 08/28/2020 10/15/2019 02/14/2018  Decreased Interest 2 2 3 3 2   Down, Depressed, Hopeless 2 2 2 2 1   PHQ - 2 Score 4 4 5 5 3   Altered sleeping 3 3 3 3 2   Tired, decreased energy 3 3 2 3 2   Change in appetite 3 1 3 3 3   Feeling bad or failure about yourself  2 2 3 3 2   Trouble concentrating 0 3 2 3 3   Moving slowly or fidgety/restless 0 2 1 3 1   Suicidal thoughts 0 0 0 0 0  PHQ-9 Score 15 18 19 23 16   Difficult doing work/chores Very difficult Extremely dIfficult Extremely dIfficult Very difficult Not difficult at all   No results found for any visits on 02/24/21.  Assessment & Plan     1. Left knee pain, unspecified chronicity May need Ortho referral. - DG Lumbar Spine Complete - DG Knee Complete 4 Views Left  2. Left-sided low back pain with left-sided sciatica, unspecified chronicity Symptoms fit most with DDD but the weightbearing favors a knee problem. - DG Lumbar Spine Complete   3. Bilateral lower extremity edema On Lasix.  Obtain Dopplers of her lower extremities.  4. Bilateral lower extremity pain Uncertain of this etiology presently.  Treat as sciatica present - predniSONE (DELTASONE) 10 MG tablet; Take 1 tablet (10 mg total) by mouth daily with breakfast.  Dispense: 21 tablet; Refill: 0 - US Venous Img Lower Bilateral (DVT); Future  5. Morbid obesity ([place]) Patient is continuing to gain weight I think this is a major health issue for her. CT of abdomen pending per GI.  6. Generalized anxiety disorder Contributory to present issue   No follow-ups on file.      I, Wilhemena Durie, MD, have reviewed all documentation for this visit. The documentation on 03/03/21 for the exam, diagnosis,  procedures, and orders are all accurate and complete.    Khalik Pewitt Cranford Mon, MD  [place] [phone] (phone) [phone] (fax)  [employer]

## 2021-02-24 NOTE — Telephone Encounter (Signed)
Submitted PA through cover my meds for Creon. Attached last office visit notes also. Waiting on response from insurance company.

## 2021-02-25 ENCOUNTER — Ambulatory Visit: Payer: BLUE CROSS/BLUE SHIELD | Admitting: Family Medicine

## 2021-02-25 MED ORDER — ZENPEP 40000-126000 UNITS PO CPEP: ORAL_CAPSULE | ORAL | 1 refills | Status: DC

## 2021-02-25 NOTE — Telephone Encounter (Signed)
Insurance company wants her to try Zenpep before creon

## 2021-02-25 NOTE — Telephone Encounter (Signed)
Let's try zenpep 2 capsules with each meal and one with snack  RV

## 2021-02-25 NOTE — Addendum Note (Signed)
Addended by: Ulyess Blossom L on: 02/25/2021 08:32 AM   Modules accepted: Orders

## 2021-02-25 NOTE — Telephone Encounter (Signed)
Sent medication to the pharmacy  

## 2021-02-28 ENCOUNTER — Encounter: Payer: Self-pay | Admitting: Family Medicine

## 2021-03-01 ENCOUNTER — Telehealth: Payer: Self-pay | Admitting: Family Medicine

## 2021-03-01 DIAGNOSIS — M25562 Pain in left knee: Secondary | ICD-10-CM

## 2021-03-01 NOTE — Telephone Encounter (Signed)
[facility] is scheduling out into June for their ultrasounds. If you want a sooner appointment to rule out her DVT it will need to be marked stat,Thanks

## 2021-03-01 NOTE — Telephone Encounter (Signed)
I thought we had ordered a Doppler study on this patient to rule out DVT.  It needs to be done ASAP and then if the pain continues to persist the patient needs referral. to orthopedics.

## 2021-03-02 ENCOUNTER — Other Ambulatory Visit: Payer: Self-pay | Admitting: *Deleted

## 2021-03-02 ENCOUNTER — Ambulatory Visit: Payer: BLUE CROSS/BLUE SHIELD | Admitting: Allergy and Immunology

## 2021-03-02 ENCOUNTER — Ambulatory Visit: Payer: 59 | Admitting: Family Medicine

## 2021-03-02 DIAGNOSIS — R600 Localized edema: Secondary | ICD-10-CM

## 2021-03-02 DIAGNOSIS — M79604 Pain in right leg: Secondary | ICD-10-CM

## 2021-03-02 DIAGNOSIS — M25562 Pain in left knee: Secondary | ICD-10-CM

## 2021-03-02 DIAGNOSIS — M79605 Pain in left leg: Secondary | ICD-10-CM

## 2021-03-02 DIAGNOSIS — M5442 Lumbago with sciatica, left side: Secondary | ICD-10-CM

## 2021-03-02 NOTE — Telephone Encounter (Signed)
Reordered STAT. Thanks!

## 2021-03-02 NOTE — Telephone Encounter (Signed)
Appoinment has been rescheduled for 03/04/21. Pt states she can not go before then because her car is in the shop. She did state her leg does look a lot better   [facility] ask that since test is scheduled 2 days out if you will change to ASAP instead of stat,Thanks

## 2021-03-02 NOTE — Telephone Encounter (Signed)
Reordered as ASAP. Thank you!!

## 2021-03-03 ENCOUNTER — Telehealth: Payer: Self-pay

## 2021-03-03 NOTE — Telephone Encounter (Signed)
ok 

## 2021-03-03 NOTE — Telephone Encounter (Signed)
Copied from [place] [id number]. Topic: General - Other >> Mar 03, 2021  2:39 PM Sandra Wilkins wrote: Reason for CRM:pt has called and just wanted to put a note in chart to Dr Synthia Innocent nurse that she can not take prednisone any more. She stated she really had a bad night was shaky all over and felt like her body was burning. States she is better now just a little shaky. Pt was offered NT but stated that she did not think necessary but will not be able to take the med again and to document. Her contact # is [phone]

## 2021-03-03 NOTE — Telephone Encounter (Signed)
Noted  

## 2021-03-04 ENCOUNTER — Other Ambulatory Visit: Payer: Self-pay

## 2021-03-04 ENCOUNTER — Telehealth: Payer: Self-pay

## 2021-03-04 ENCOUNTER — Ambulatory Visit
Admission: RE | Admit: 2021-03-04 | Discharge: 2021-03-04 | Disposition: A | Discharge status: Home / Self Care | Payer: 59 | Source: Ambulatory Visit | Attending: Family Medicine | Admitting: Family Medicine

## 2021-03-04 DIAGNOSIS — M25562 Pain in left knee: Secondary | ICD-10-CM | POA: Diagnosis not present

## 2021-03-04 DIAGNOSIS — M5386 Other specified dorsopathies, lumbar region: Secondary | ICD-10-CM | POA: Insufficient documentation

## 2021-03-04 IMAGING — US US EXTREM LOW VENOUS*L*
1 series · 14 of 24 positions shown · non-contrast
Comparison: No recent prior.

CLINICAL DATA: Left knee pain.  Edema.

EXAM:
LEFT LOWER EXTREMITY VENOUS DOPPLER ULTRASOUND
TECHNIQUE: Gray-scale sonography with compression, as well as color and duplex
ultrasound, were performed to evaluate the deep venous system(s)
from the level of the common femoral vein through the popliteal and
proximal calf veins.

[Series 1: us venous img lower uni left (dvt) · portal-venous · 14 of 33 slices shown]
[im 1/33]
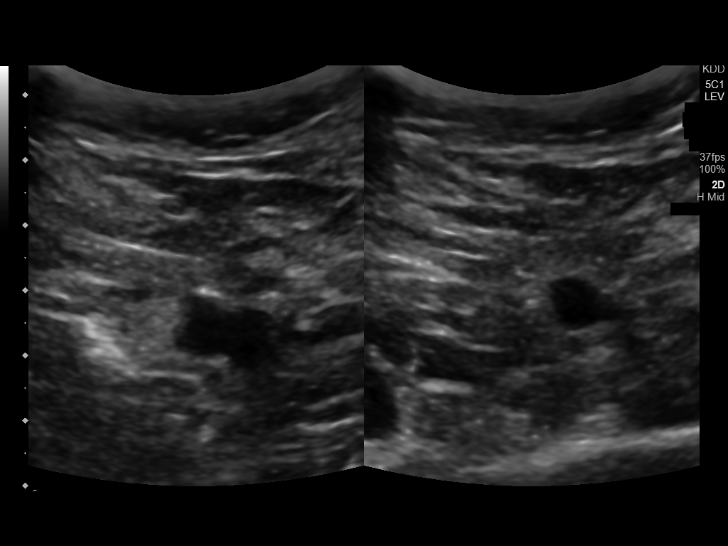
[im 3/33]
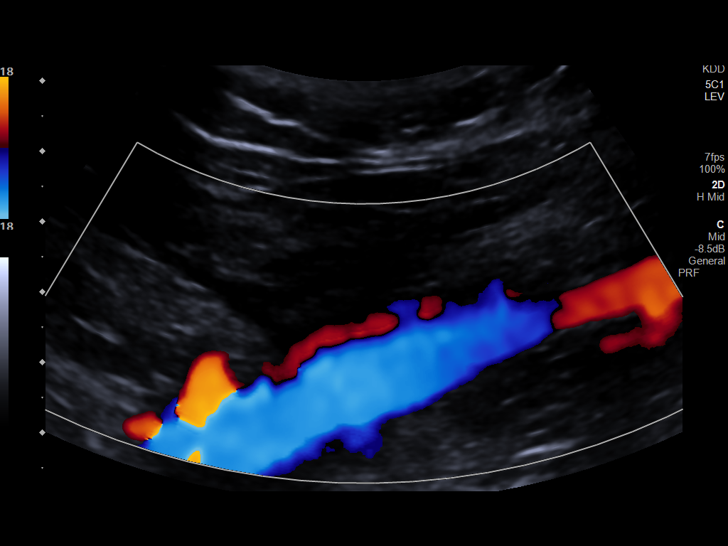
[im 6/33]
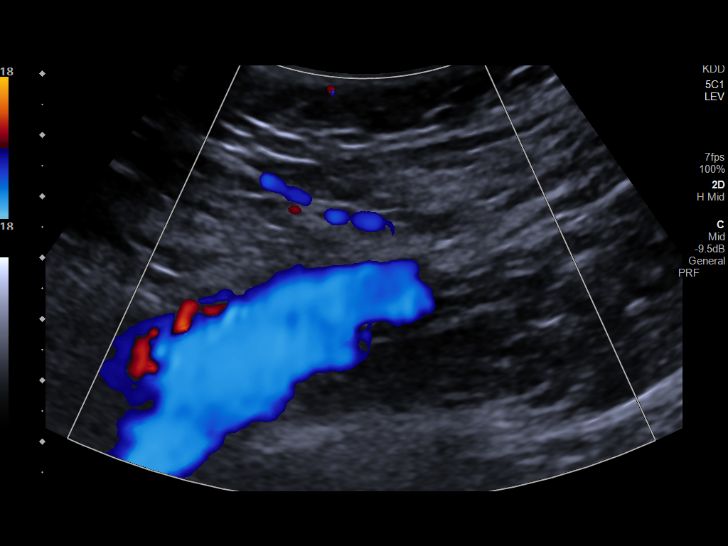
[im 9/33]
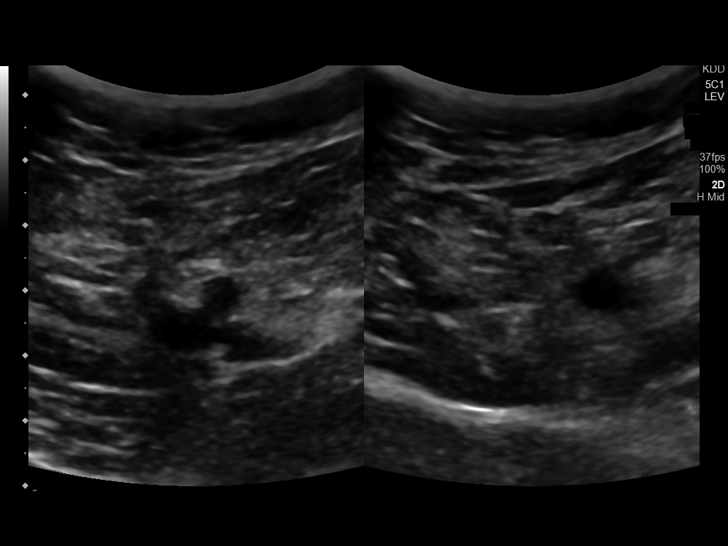
[im 10/33]
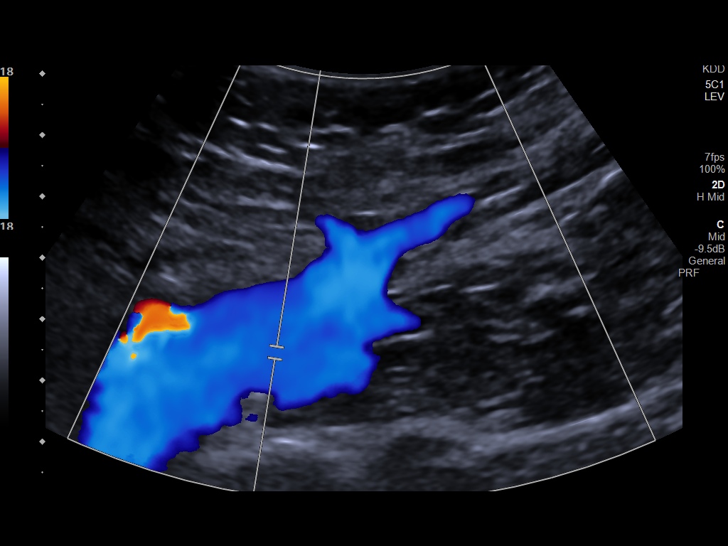
[im 13/33]
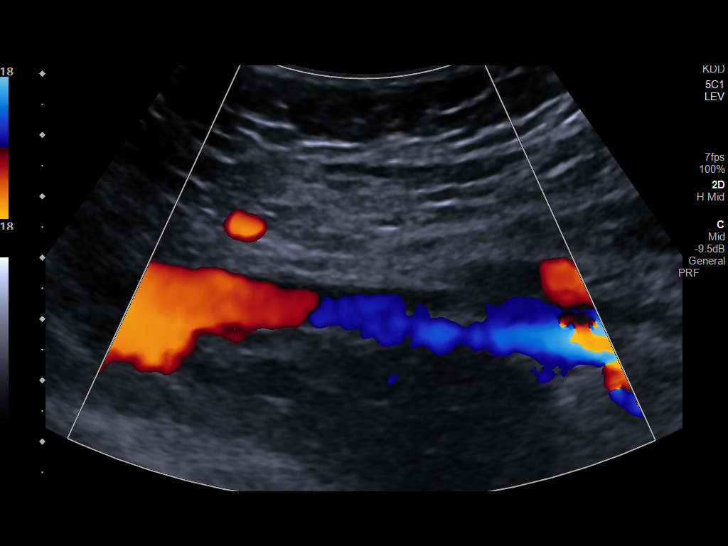
[im 16/33]
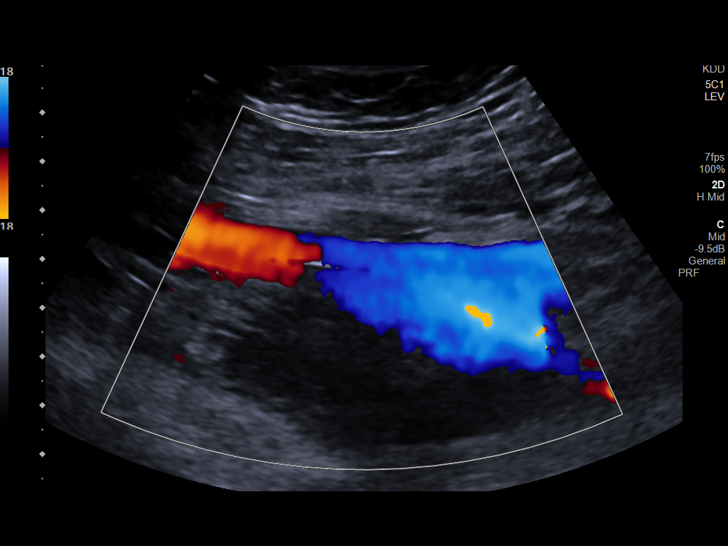
[im 17/33]
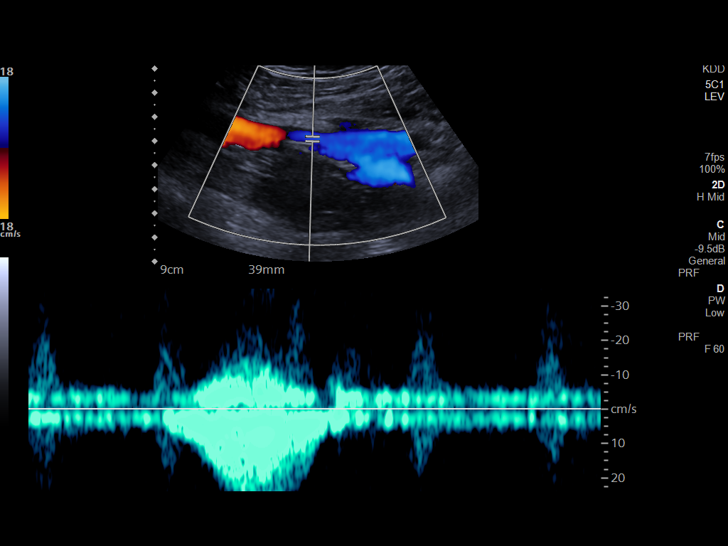
[im 20/33]
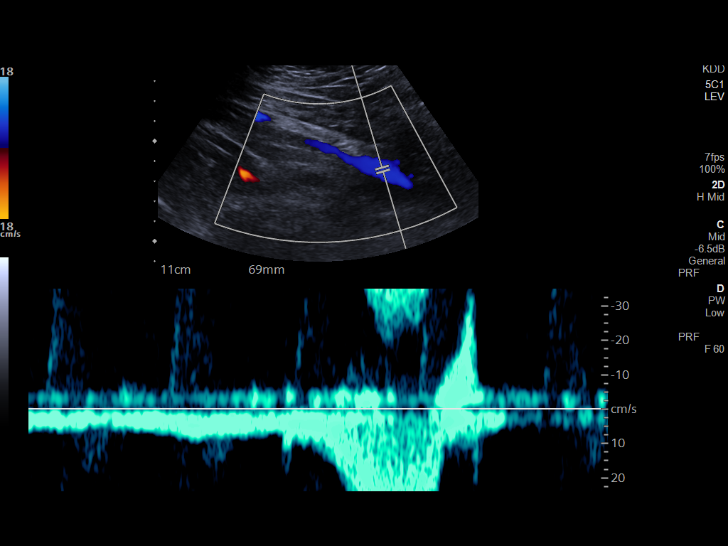
[im 23/33]
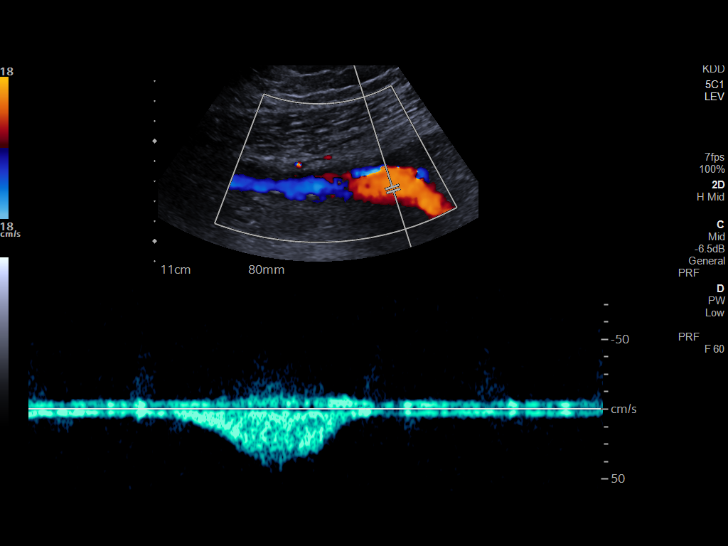
[im 26/33]
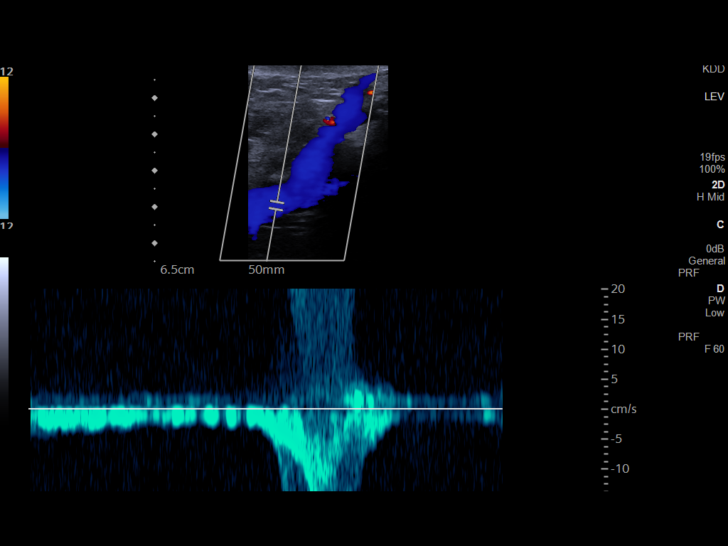
[im 27/33]
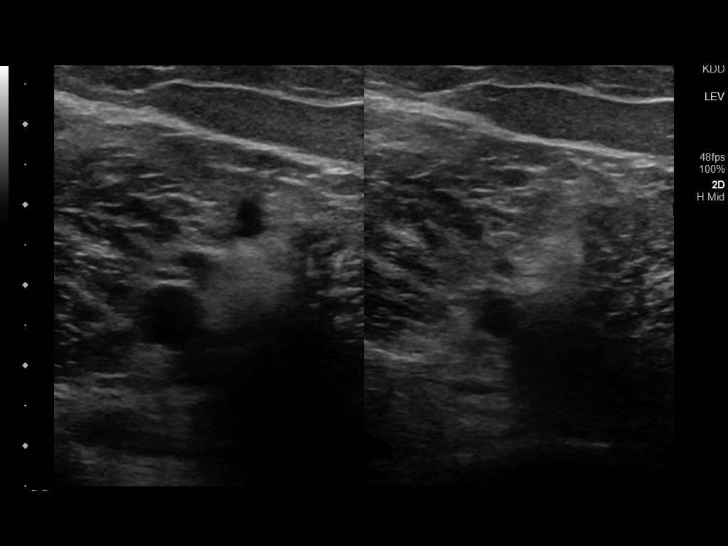
[im 30/33]
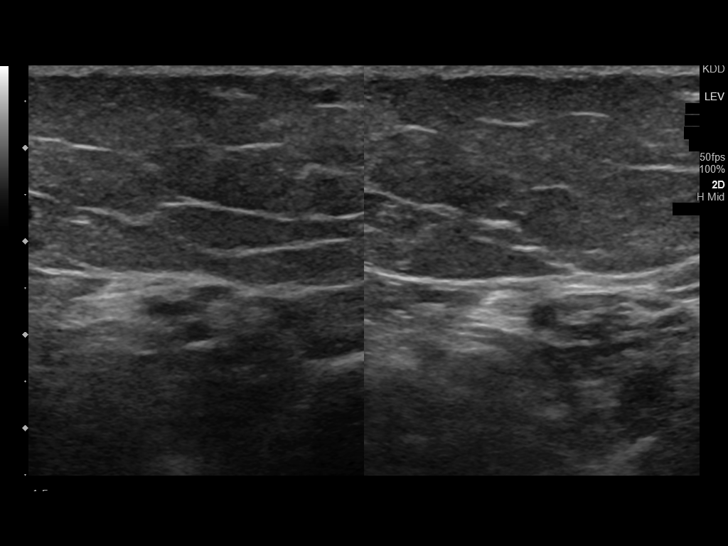
[im 33/33]
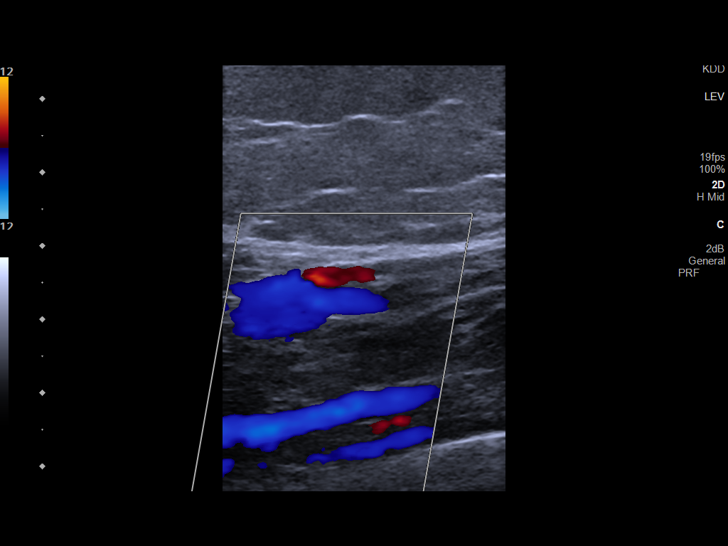

[14 of 24 positions shown; findings below may reference images not displayed]

FINDINGS: VENOUS

Normal compressibility of the common femoral, superficial femoral,
and popliteal veins, as well as the visualized calf veins.
Visualized portions of profunda femoral vein and great saphenous
vein unremarkable. No filling defects to suggest DVT on grayscale or
color Doppler imaging. Doppler waveforms show normal direction of
venous flow, normal respiratory plasticity and response to
augmentation.

Limited views of the contralateral common femoral vein are
unremarkable.

OTHER

None.

Limitations: Exam limited due to patient's body habitus.
IMPRESSION: Negative exam.  No evidence of DVT.

## 2021-03-04 NOTE — Telephone Encounter (Signed)
Patient said she was curious if meloxicam would effect her liver function due to her Hx of elevated liver enzymes. Patient also said she wanted to hold off on physical therapy referral.

## 2021-03-08 ENCOUNTER — Telehealth: Payer: Self-pay

## 2021-03-08 NOTE — Telephone Encounter (Signed)
Called Peer to Peer  Approved CT A/P with contrast given patient's history of exocrine pancreatic insufficiency To evaluate for chronic pancreatitis, pancreatic lesions/tumor/malignancy  Auth # [id number]  [name], MD

## 2021-03-08 NOTE — Telephone Encounter (Signed)
Per Ginger This pt's CT scan on 03/12/21 has been denied. I have printed and stuck on Vanga's desk. She will need to do a peer to peer by tomorrow.

## 2021-03-09 ENCOUNTER — Encounter: Payer: Self-pay | Admitting: Family Medicine

## 2021-03-09 ENCOUNTER — Encounter: Payer: Self-pay | Admitting: Gastroenterology

## 2021-03-12 ENCOUNTER — Ambulatory Visit: Admission: RE | Admit: 2021-03-12 | Payer: 59 | Source: Ambulatory Visit

## 2021-04-05 ENCOUNTER — Telehealth: Payer: Self-pay

## 2021-04-05 NOTE — Telephone Encounter (Signed)
Patient advised that we can not do virtual CPE. Patient reports she will try to come in on 04/08/21.

## 2021-04-05 NOTE — Telephone Encounter (Signed)
Copied from [place] [id number]. Topic: Appointment Scheduling - Scheduling Inquiry for Clinic >> Apr 05, 2021 11:41 AM Pawlus, Brayton Layman A wrote: Reason for CRM: Pt has an upcoming CPE on 5/26, pt asked if this could be a virtual visit, please advise if you can do a CPE virtually?  Called patient and advised that we don't do CPE's virtually. Patient reports that she has been having trouble with her le and just wants o give her leg more time to get better. She asked if her appointment can be schedule for another day told patient that Dr.B's appointment went to middle of August probably September for CPE and she asked if she can be worked in if not that she will try her best to make to the appointment and if can someone call her back please.

## 2021-04-08 ENCOUNTER — Encounter: Payer: 59 | Admitting: Family Medicine

## 2021-04-13 ENCOUNTER — Encounter: Payer: Self-pay | Admitting: Family Medicine

## 2021-04-17 ENCOUNTER — Other Ambulatory Visit: Payer: Self-pay | Admitting: Family Medicine

## 2021-04-17 DIAGNOSIS — R519 Headache, unspecified: Secondary | ICD-10-CM

## 2021-04-20 ENCOUNTER — Ambulatory Visit: Admission: RE | Admit: 2021-04-20 | Payer: 59 | Source: Ambulatory Visit

## 2021-04-21 ENCOUNTER — Ambulatory Visit: Payer: 59

## 2021-05-04 ENCOUNTER — Other Ambulatory Visit: Payer: Self-pay | Admitting: Family Medicine

## 2021-05-15 ENCOUNTER — Encounter: Payer: Self-pay | Admitting: Gastroenterology

## 2021-05-15 ENCOUNTER — Encounter: Payer: Self-pay | Admitting: Family Medicine

## 2021-05-19 ENCOUNTER — Ambulatory Visit: Payer: 59

## 2021-05-19 NOTE — Telephone Encounter (Signed)
Approval Valid Through: 03/01/2021 - 03/30/2021 Her scan Is schedule for 05/31/2021 can you get it authorized again

## 2021-05-27 ENCOUNTER — Telehealth: Payer: Self-pay | Admitting: Gastroenterology

## 2021-05-27 NOTE — Telephone Encounter (Signed)
[phone] ext. [phone]-[phone] from [facility] pre-service called and stated that patient needs a new authorization for CT-Abdomen Pelvis Contrast.

## 2021-05-31 ENCOUNTER — Telehealth: Payer: Self-pay

## 2021-05-31 ENCOUNTER — Ambulatory Visit: Payer: 59

## 2021-05-31 NOTE — Telephone Encounter (Signed)
Patient insurance company denied patient CT scan of abdomen pelvis with contrast. Insurance company states that the scan is not medical necessary and they said a ultrasound needs to be done first. Ginger sent in all clinicals to them.

## 2021-05-31 NOTE — Telephone Encounter (Signed)
I did peer to peer most recently and it was approved.  Please schedule peer to peer  RV

## 2021-06-03 ENCOUNTER — Telehealth: Payer: Self-pay

## 2021-06-03 NOTE — Telephone Encounter (Signed)
Left patient a detail message today and yesterday about when her CT scan is schedule and the instructions. Told her to call us if she had any questions

## 2021-06-07 ENCOUNTER — Other Ambulatory Visit: Payer: Self-pay

## 2021-06-07 ENCOUNTER — Ambulatory Visit
Admission: RE | Admit: 2021-06-07 | Discharge: 2021-06-07 | Disposition: A | Discharge status: Home / Self Care | Payer: 59 | Source: Ambulatory Visit | Attending: Gastroenterology | Admitting: Gastroenterology

## 2021-06-07 DIAGNOSIS — K529 Noninfective gastroenteritis and colitis, unspecified: Secondary | ICD-10-CM | POA: Insufficient documentation

## 2021-06-07 LAB — POCT I-STAT CREATININE: Creatinine, Ser: 1.2 mg/dL — ABNORMAL HIGH (ref 0.44–1.00)

## 2021-06-07 IMAGING — CT CT ABD-PELV W/ CM
2 of 5 series · 16 of 46 positions shown, 18 images · IV contrast (omnipaque)
Comparison: Abdominal ultrasound [DATE] .

CLINICAL DATA: Diarrhea and epigastric pain. Gastroesophageal
reflux.

EXAM:
CT ABDOMEN AND PELVIS WITH CONTRAST
TECHNIQUE: Multidetector CT imaging of the abdomen and pelvis was performed
using the standard protocol following bolus administration of
intravenous contrast.
CONTRAST:  100mL OMNIPAQUE IOHEXOL 350 MG/ML SOLN

[Series 2: abd pelvis 5.00 · axial · 0.98mm/px · z∈[-1693,-1218]mm · 13 of 107 slices shown, 15 images]
[im 6/107  soft-tissue]
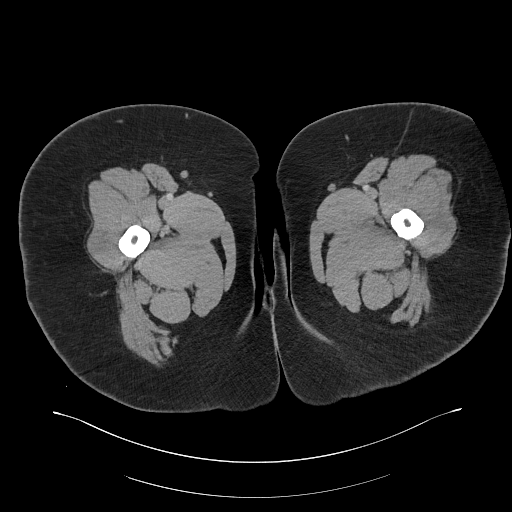
[im 6/107  bone]
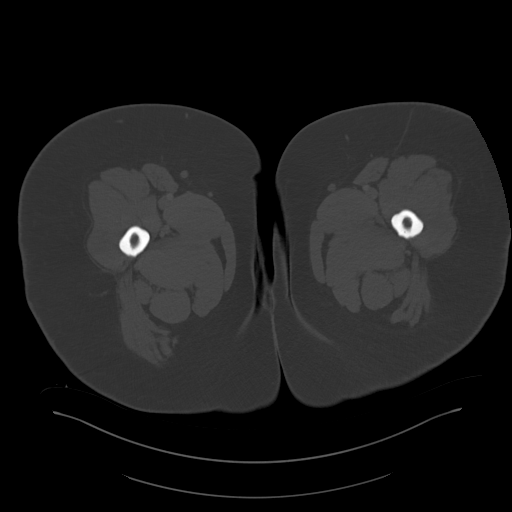
[im 17/107  soft-tissue]
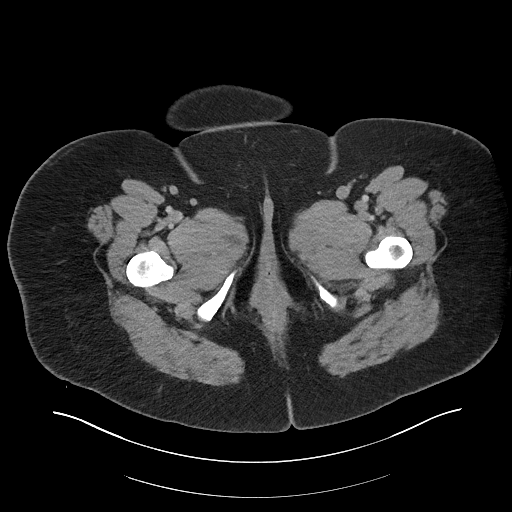
[im 23/107  soft-tissue]
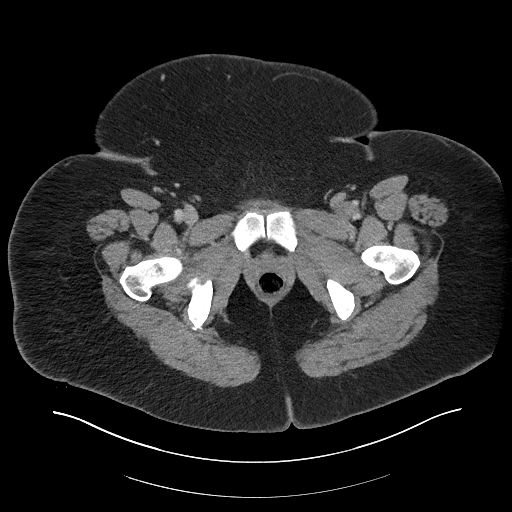
[im 28/107  soft-tissue]
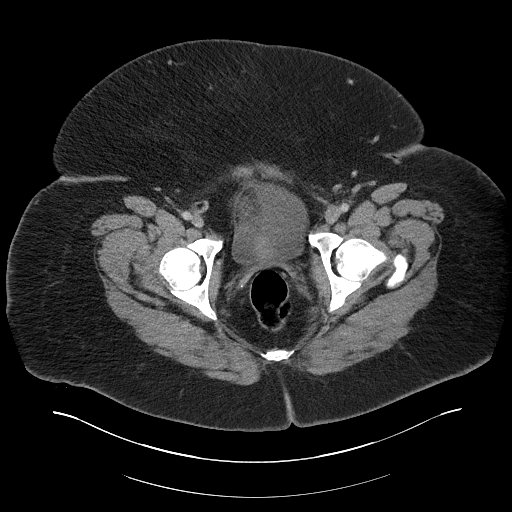
[im 40/107  soft-tissue]
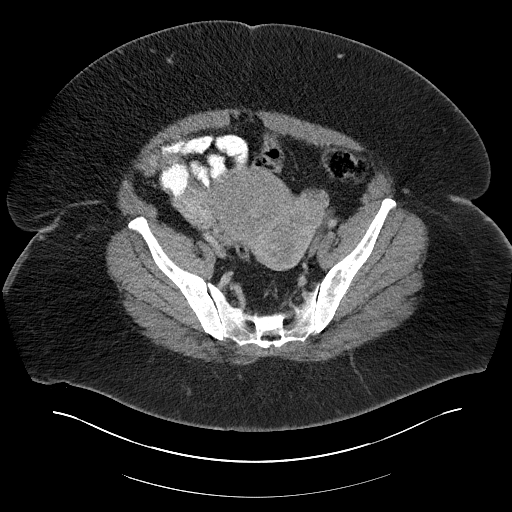
[im 45/107  soft-tissue]
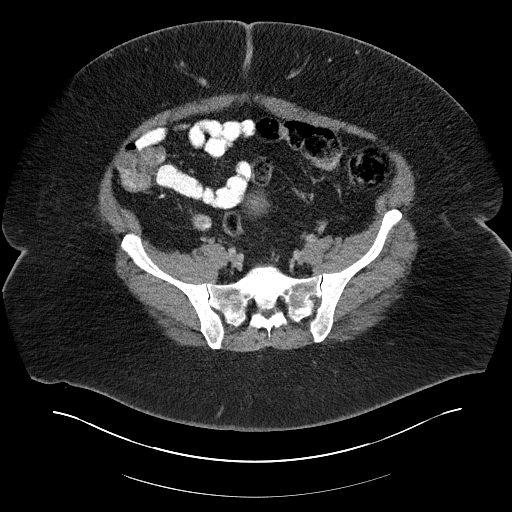
[im 56/107  soft-tissue]
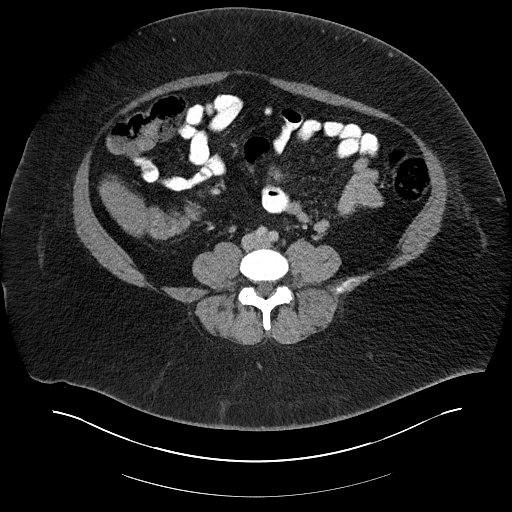
[im 62/107  soft-tissue]
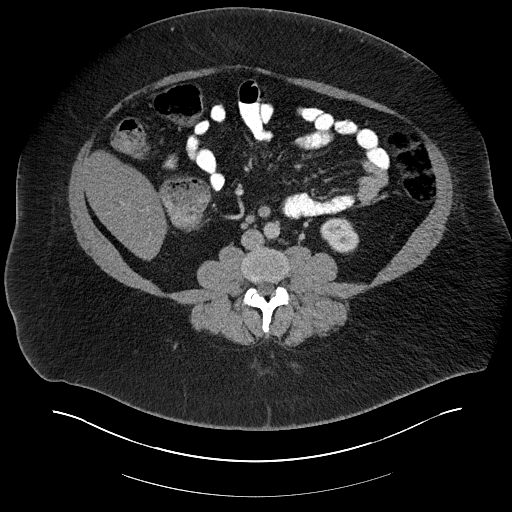
[im 67/107  soft-tissue]
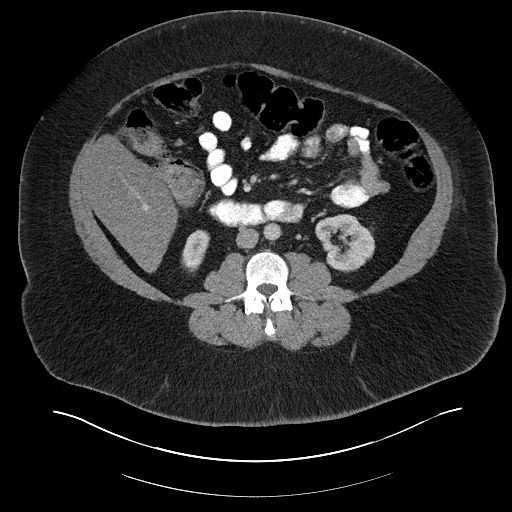
[im 67/107  bone]
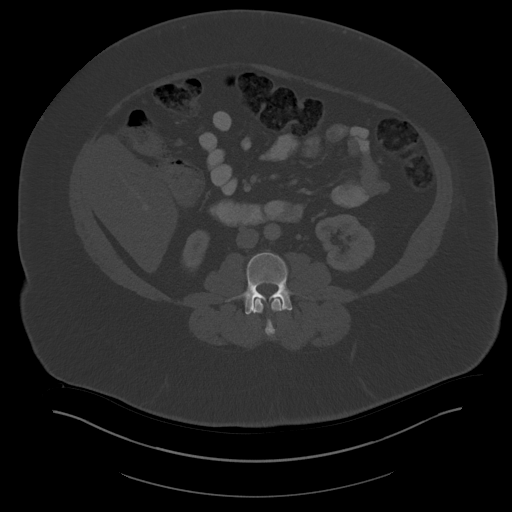
[im 79/107  soft-tissue]
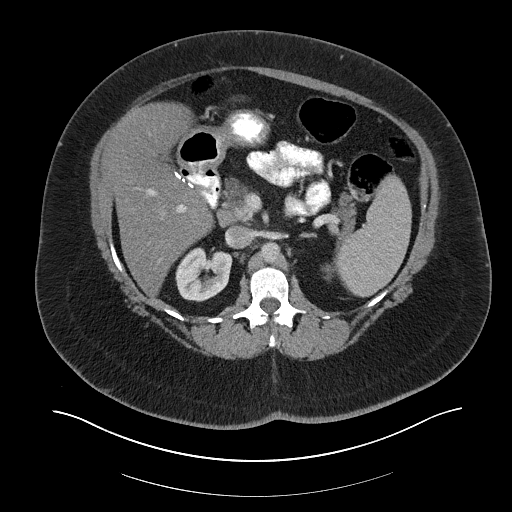
[im 84/107  soft-tissue]
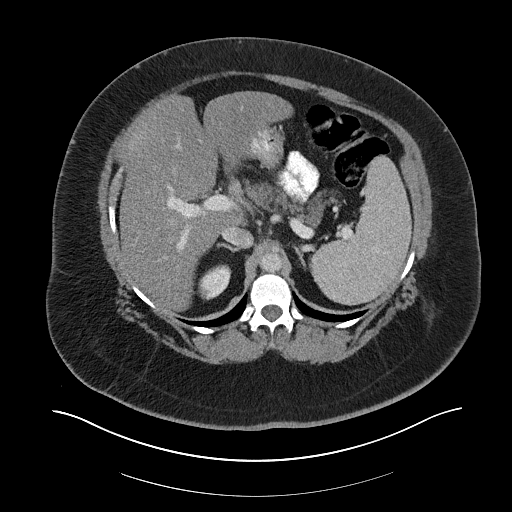
[im 90/107  soft-tissue]
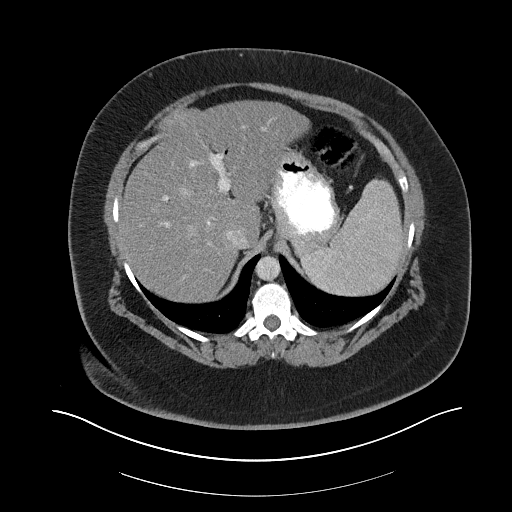
[im 101/107  soft-tissue]
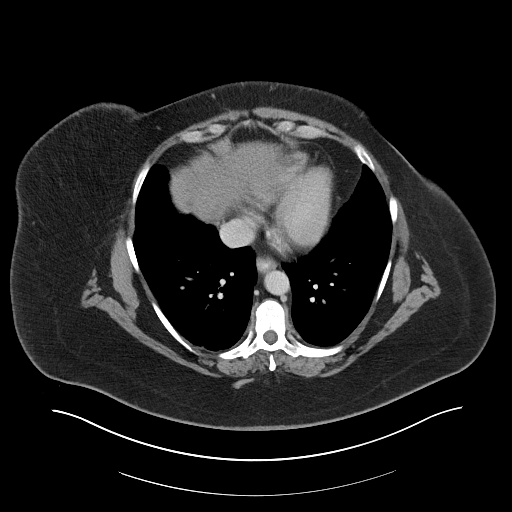

[Series 3: coronals abd pelvis 2.00 cor · coronal · 0.98mm/px · 3 of 193 slices shown]
[im 65/193  soft-tissue]
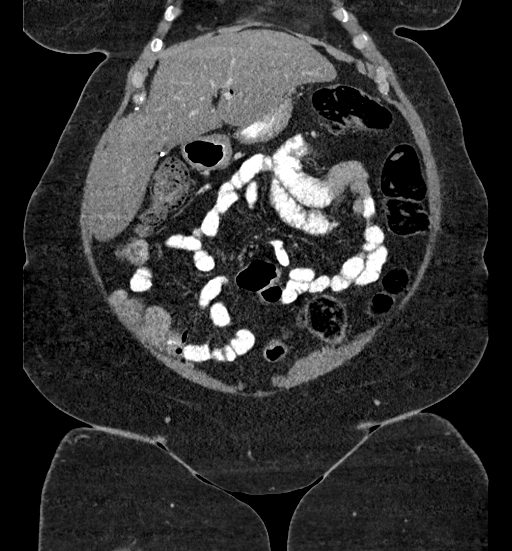
[im 86/193  soft-tissue]
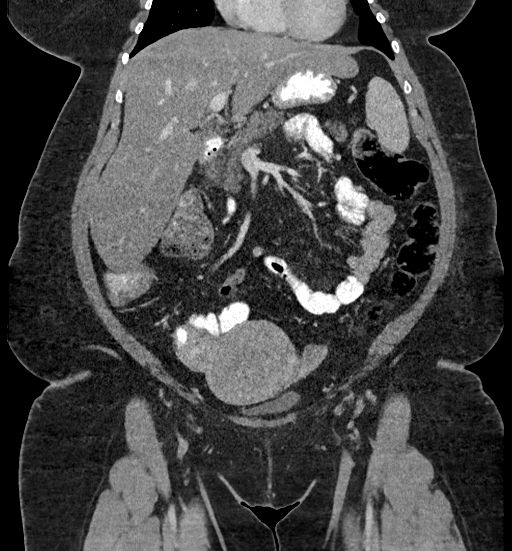
[im 107/193  soft-tissue]
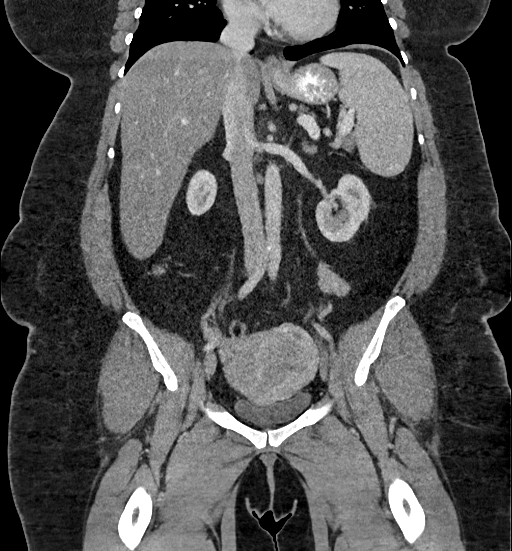

[16 of 46 positions shown; findings below may reference images not displayed]

FINDINGS: Lower chest: The multifocal airspace opacities in the lung bases
shown on [DATE] have resolved although there are some mild
subpleural bandlike densities dependently in both lower lobes and in
the right middle lobe. Faint mosaic attenuation in the lung bases,
possibly from air trapping.

Small type 1 hiatal hernia. Contrast medium in the distal esophagus
suggesting dysmotility or reflux.

Hepatobiliary: Suspected hepatic steatosis. Mild pneumobilia. Prior
cholecystectomy. No focal liver lesion identified.

Pancreas: Unremarkable

Spleen: Unremarkable

Adrenals/Urinary Tract: Unremarkable

Stomach/Bowel: Small type 1 hiatal hernia.

Vascular/Lymphatic: Unremarkable

Reproductive: Heterogeneous and lobular uterus with a suspected
right anterior fibroid measuring about 7.8 by 7.1 by 7.8 cm, and
questionable thickening of the endometrium versus subserosal
fibroid.

Other: No supplemental non-categorized findings.

Musculoskeletal: Unremarkable
IMPRESSION: 1. Lobulated uterus with indistinct endometrium, likely related to
fibroids. If the patient has abnormal ureter own bleeding then
further workup with pelvic sonography would be recommended.
2. Small type 1 hiatal hernia, with contrast medium in the distal
esophagus suggesting dysmotility or reflux.
3. Suspected hepatic steatosis with mild pneumobilia.
4. Prior basilar multifocal opacities shown on [DATE] have
resolved although there is some faint bandlike subpleural density
which may represent postinflammatory residua/scarring. There is also
mild mosaic attenuation in the lung bases suggesting mild air
trapping.

## 2021-06-07 MED ORDER — IOHEXOL 350 MG/ML SOLN
100.0000 mL | Freq: Once | INTRAVENOUS | Status: AC | PRN
  Administered 2021-06-07: 100 mL via INTRAVENOUS

## 2021-06-09 ENCOUNTER — Encounter: Payer: Self-pay | Admitting: Gastroenterology

## 2021-06-11 ENCOUNTER — Other Ambulatory Visit: Payer: Self-pay | Admitting: Family Medicine

## 2021-06-11 DIAGNOSIS — R519 Headache, unspecified: Secondary | ICD-10-CM

## 2021-06-21 ENCOUNTER — Ambulatory Visit: Payer: 59 | Admitting: Gastroenterology

## 2021-06-28 ENCOUNTER — Other Ambulatory Visit: Payer: Self-pay

## 2021-06-29 ENCOUNTER — Ambulatory Visit (INDEPENDENT_AMBULATORY_CARE_PROVIDER_SITE_OTHER): Payer: 59 | Admitting: Gastroenterology

## 2021-06-29 ENCOUNTER — Encounter: Payer: Self-pay | Admitting: Gastroenterology

## 2021-06-29 ENCOUNTER — Other Ambulatory Visit: Payer: Self-pay

## 2021-06-29 VITALS — BP 156/92 | HR 95 | Temp 98.2°F | Ht 62.0 in | Wt 298.1 lb

## 2021-06-29 DIAGNOSIS — K760 Fatty (change of) liver, not elsewhere classified: Secondary | ICD-10-CM

## 2021-06-29 DIAGNOSIS — K8681 Exocrine pancreatic insufficiency: Secondary | ICD-10-CM | POA: Diagnosis not present

## 2021-06-29 NOTE — Progress Notes (Signed)
Cephas Darby, MD [address]  [place]  [place], [place] [address]  Main: [phone]  Fax: [phone]    Gastroenterology Consultation  Referring Provider:     Virginia Crews, MD Primary Care Physician:  Virginia Crews, MD Primary Gastroenterologist:  Dr. Cephas Darby Reason for Consultation:     Exocrine pancreatic insufficiency        HPI:   Sandra Wilkins is a 55 y.o. female referred by Dr. Brita Romp, Dionne Bucy, MD  for consultation & management of chronic diarrhea.  Patient reports approximately 3 years history of increased bowel frequency, associated with severe abdominal bloating and postprandial urgency.  This has started since she had cholecystectomy in 2018.  She went on intermittent fasting around that time and lost about 14 pounds.  Subsequently, she was sick and lost 20 more pounds.  She tried cholestyramine and currently on colestipol 1 g twice daily which has modestly improved the consistency of the stool.  She also had episodes of urgency associated with accidents at work.  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  She is consuming mostly bread and potato that has led to significant weight gain.  She also notices rectal burning as well as bright red blood per rectum.  She does have pain in her lower abdomen that radiates to the back.  Previous stool studies were negative for infection including C. difficile in 2018.  Patient has known history of fatty liver and noted to have thrombocytopenia 10/2019 when she had Covid pneumonia with acute hypoxic respiratory failure.  Follow-up visit 12/23/2020 Patient is here for follow-up of diarrhea.  She continues to have 5-6 episodes of nonbloody diarrhea associated with abdominal bloating and cramps and sometimes fecal incontinence.  She is unable to get out due to ongoing diarrhea.  She is on short-term disability through her work, currently  working from home.  She is taking Imodium at least twice a day.  Her upper endoscopy and colonoscopy were unremarkable.  She increased colestipol to 2 tablets 3 times daily which is helping with her abdominal cramps but not so much with diarrhea.  Her weight is stable. She feels depressed, currently taking amitriptyline 20 mg for her headaches.  Follow-up visit 06/30/2021 Patient is here for follow-up of new diagnosis of mild EPI.  Her pancreatic fecal elastase levels were slightly below normal.  I started her on Zenpep 40,000 units 2 capsules with each meal and 1 with snack.  She continues to take amitriptyline 50 mg at bedtime.  She reports that overall her diarrhea has improved but she continues to have postprandial abdominal cramps and bloating.  She continues to work from home  Patient denies smoking or alcohol use She denies family history of GI malignancy, inflammatory bowel disease  NSAIDs: None  Antiplts/Anticoagulants/Anti thrombotics: None  GI Procedures:  Patient did not undergo EGD or colonoscopy in the past patient underwent ERCP 04/13/2017 secondary to choledocholithiasis, underwent biliary sphincterotomy and temporary biliary stent placement with removal in 06/2017  EGD and colonoscopy 07/28/2020 - Normal examined duodenum. Biopsied. - Normal stomach. Biopsied. - Esophagogastric landmarks identified. - Normal gastroesophageal junction and esophagus.  - The entire examined colon is normal. Biopsied. - The distal rectum and anal verge are normal on retroflexion view.  DIAGNOSIS:  A. DUODENUM; COLD BIOPSY:  - DUODENAL MUCOSA WITH NO SIGNIFICANT PATHOLOGIC ALTERATION.  - NEGATIVE FOR FEATURES OF CELIAC DISEASE.  - NEGATIVE  FOR DYSPLASIA AND MALIGNANCY.   B. STOMACH, RANDOM; COLD BIOPSY:  - ANTRAL MUCOSA WITH MILD CHRONIC INFLAMMATION AND REACTIVE CHANGES.  - OXYNTIC MUCOSA WITH NO SIGNIFICANT PATHOLOGIC ALTERATION.  - NEGATIVE FOR ACTIVE INFLAMMATION AND H PYLORI.  -  NEGATIVE FOR INTESTINAL METAPLASIA, DYSPLASIA, AND MALIGNANCY.   C. COLON, RANDOM; COLD BIOPSY:  - COLONIC MUCOSA WITH NO SIGNIFICANT PATHOLOGIC ALTERATION.  - NEGATIVE FOR MICROSCOPIC COLITIS, DYSPLASIA, AND MALIGNANCY.  Past Medical History:  Diagnosis Date  . Anxiety   . Arthritis    DDD-NECK  . COVID-19 11/04/2019  . Depression   . GERD (gastroesophageal reflux disease)   . Headache    MIGRAINES  . Hypertension    H/O WAS ON FUROSEMIDE IN THE PAST BUT LOST WEIGHT AND WAS TAKEN OFF DUE TO BP CONTROL  . Pneumonia due to COVID-19 virus 11/03/2019  . Pre-diabetes   . Sleep apnea    CPAP    Past Surgical History:  Procedure Laterality Date  . BREAST BIOPSY Right 2012   benign  . CHOLECYSTECTOMY  04/17/2017   Procedure: CHOLECYSTECTOMY;  Surgeon: Robert Bellow, MD;  Location: [facility] ORS;  Service: General;;  Laparoscopic converted to open  . COLONOSCOPY WITH PROPOFOL N/A 07/28/2020   Procedure: COLONOSCOPY WITH PROPOFOL;  Surgeon: Lin Landsman, MD;  Location: [facility] ENDOSCOPY;  Service: Gastroenterology;  Laterality: N/A;  . ERCP N/A 04/13/2017   Procedure: ENDOSCOPIC RETROGRADE CHOLANGIOPANCREATOGRAPHY (ERCP);  Surgeon: Lucilla Lame, MD;  Location: [facility] ENDOSCOPY;  Service: Endoscopy;  Laterality: N/A;  . ERCP N/A 06/20/2017   Procedure: ENDOSCOPIC RETROGRADE CHOLANGIOPANCREATOGRAPHY (ERCP) STENT REMOVAL;  Surgeon: Lucilla Lame, MD;  Location: [facility] ENDOSCOPY;  Service: Endoscopy;  Laterality: N/A;  . ESOPHAGOGASTRODUODENOSCOPY (EGD) WITH PROPOFOL N/A 07/28/2020   Procedure: ESOPHAGOGASTRODUODENOSCOPY (EGD) WITH PROPOFOL;  Surgeon: Lin Landsman, MD;  Location: [facility] ENDOSCOPY;  Service: Gastroenterology;  Laterality: N/A;  . TONSILLECTOMY AND ADENOIDECTOMY  1972    Current Outpatient Medications:  .  amitriptyline (ELAVIL) 25 MG tablet, TAKE 2 TABLETS BY MOUTH AT BEDTIME., Disp: 180 tablet, Rfl: 1 .  omeprazole (PRILOSEC) 20 MG capsule, TAKE 1 CAPSULE BY MOUTH EVERY  DAY, Disp: 90 capsule, Rfl: 0 .  Pancrelipase, Lip-Prot-Amyl, (ZENPEP) 40000-126000 units CPEP, Take 2 capsules with the first bite of each meal and 1 capsule with the first bite of each snack, Disp: 240 capsule, Rfl: 1 .  sertraline (ZOLOFT) 100 MG tablet, Take 1 tablet (100 mg total) by mouth 2 (two) times daily., Disp: 180 tablet, Rfl: 1 .  ALPRAZolam (XANAX) 0.5 MG tablet, Take 0.5-1 tablets (0.25-0.5 mg total) by mouth every 4 (four) hours as needed for anxiety. (Patient not taking: Reported on 06/29/2021), Disp: 30 tablet, Rfl: 2 .  furosemide (LASIX) 20 MG tablet, Take 2 tablets (40 mg total) by mouth daily. (Patient not taking: Reported on 06/29/2021), Disp: 60 tablet, Rfl: 3   Family History  Problem Relation Age of Onset  . Hyperlipidemia Father   . Depression Sister   . Bipolar disorder Brother   . Seizures Brother   . Congestive Heart Failure Maternal Grandfather   . Dementia Paternal Grandfather   . Depression Sister   . Depression Mother   . Arthritis Mother   . Emphysema Mother   . Congestive Heart Failure Mother   . Arthritis Maternal Grandmother   . Asthma Maternal Grandmother   . Breast cancer Neg Hx   . Ovarian cancer Neg Hx   . Colon cancer Neg Hx  Social History   Tobacco Use  . Smoking status: Former    Packs/day: 0.50    Years: 20.00    Pack years: 10.00    Types: Cigarettes    Quit date: 11/13/2005    Years since quitting: 15.6  . Smokeless tobacco: Never  Vaping Use  . Vaping Use: Never used  Substance Use Topics  . Alcohol use: Not Currently    Comment: occasional   . Drug use: No    Allergies as of 06/29/2021 - Review Complete 06/29/2021  Allergen Reaction Noted  . Codeine Hives 04/16/2015  . Other Hives and Itching 04/11/2017  . Sulfa antibiotics Hives and Itching 04/16/2015  . Tramadol Other (See Comments) 04/11/2017  . Penicillins Rash 04/16/2015    Review of Systems:    All systems reviewed and negative except where noted in  HPI.   Physical Exam:  BP (!) 156/92 (BP Location: Left Arm, Patient Position: Sitting, Cuff Size: Large)   Pulse 95   Temp 98.2 F (36.8 C) (Oral)   Ht '5\' 2"'$  (1.575 m)   Wt 298 lb 2 oz (135.2 kg)   BMI 54.53 kg/m  No LMP recorded. (Menstrual status: Perimenopausal).  General:   Alert,  Well-developed, well-nourished, pleasant and cooperative in NAD Head:  Normocephalic and atraumatic. Eyes:  Sclera clear, no icterus.   Conjunctiva pink. Ears:  Normal auditory acuity. Nose:  No deformity, discharge, or lesions. Mouth:  No deformity or lesions,oropharynx pink & moist. Neck:  Supple; no masses or thyromegaly. Lungs:  Respirations even and unlabored.  Clear throughout to auscultation.   No wheezes, crackles, or rhonchi. No acute distress. Heart:  Regular rate and rhythm; no murmurs, clicks, rubs, or gallops. Abdomen:  Normal bowel sounds. Soft, morbidly obese, non-tender and nondistended, without masses, hepatosplenomegaly or hernias noted.  No guarding or rebound tenderness.   Rectal: Not performed Msk:  Symmetrical without gross deformities. Good, equal movement & strength bilaterally. Pulses:  Normal pulses noted. Extremities:  No clubbing or edema.  No cyanosis. Neurologic:  Alert and oriented x3;  grossly normal neurologically. Skin:  Intact without significant lesions or rashes. No jaundice. Psych:  Alert and cooperative. Normal mood and affect.  Imaging Studies: No abdominal imaging  Assessment and Plan:   Sandra Wilkins is a 55 y.o. Caucasian female with morbid obesity, BMI 52.93, s/p cholecystectomy secondary to symptomatic cholelithiasis and choledocholithiasis in 2018 is seen in for follow-up of chronic nonbloody diarrhea, abdominal bloating, postprandial urgency without any other constitutional signs or symptoms.  Upper endoscopy and colonoscopy including biopsies were unremarkable.  No evidence of celiac disease, microscopic colitis or IBD.  Stool studies were negative  for infection including C. 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

## 2021-06-30 DIAGNOSIS — K8681 Exocrine pancreatic insufficiency: Secondary | ICD-10-CM | POA: Insufficient documentation

## 2021-07-02 ENCOUNTER — Encounter: Payer: Self-pay | Admitting: Family Medicine

## 2021-07-05 NOTE — Telephone Encounter (Signed)
She'll need a visit for the UTI symptoms if still present

## 2021-07-12 ENCOUNTER — Telehealth: Payer: Self-pay

## 2021-07-12 NOTE — Telephone Encounter (Signed)
Copied from [place] [id number]. Topic: General - Other >> Jul 12, 2021  4:46 PM Wynetta Emery, [place] C wrote: Reason for CRM: Dr. Steward Ros office called in to speak with provider. They have received medical records for pt's disability case. Provider would like to discuss if pt has any additional disabilities for her case?  CB: [phone]

## 2021-07-14 ENCOUNTER — Telehealth: Payer: Self-pay

## 2021-07-14 NOTE — Telephone Encounter (Signed)
Copied from [place] [id number]. Topic: General - Other >> Jul 14, 2021 10:28 AM Valere Dross wrote: Reason for CRM: Dr. Steward Ros office called, asking for a per to per with PCP. Please advise.

## 2021-07-15 ENCOUNTER — Telehealth: Payer: Self-pay

## 2021-07-15 NOTE — Telephone Encounter (Signed)
Patient is apply for disability and her attending doctor on her case would like to talk you about restrictions and limitations.  CB number is [phone]

## 2021-07-15 NOTE — Telephone Encounter (Signed)
See other phone note - will attempt to call back today

## 2021-07-15 NOTE — Telephone Encounter (Signed)
Spoke to Dr Orion Crook. Patient is not currently working. Discussed that her diarrhea and fecal incontinence had been preventing her from working. Agreed that she may be able to work from home if this is possible in her job.

## 2021-07-28 NOTE — Telephone Encounter (Signed)
Dr. Jacinto Reap  I just called her attending doctor regarding the disability.  Looks like you have already been contacted.  Thank you for addressing her case I was told, that they will reach out to me if they need more information  [name], MD

## 2021-07-28 NOTE — Telephone Encounter (Signed)
Ok great. Thank you

## 2021-07-29 ENCOUNTER — Ambulatory Visit: Payer: 59 | Admitting: Family Medicine

## 2021-08-17 ENCOUNTER — Other Ambulatory Visit: Payer: Self-pay | Admitting: Family Medicine

## 2021-08-17 NOTE — Telephone Encounter (Signed)
Requested medication (s) are due for refill today: yes  Requested medication (s) are on the active medication list: NO   Last refill: 09/11/20  #120  Future visit scheduled yes 09/13/21  Notes to clinic: Not on current med profile. Please review  Requested Prescriptions  Pending Prescriptions Disp Refills   colestipol (COLESTID) 1 g tablet [Pharmacy Med Name: COLESTIPOL HCL 1 GM TAB] 120 tablet     Sig: TAKE ONE TABLET (1GRAM) BY MOUTH TWICE DAILY     Cardiovascular:  Antilipid - Bile Acid Sequestrants Failed - 08/17/2021  1:31 PM      Failed - Triglycerides in normal range and within 360 days    Triglycerides  Date Value Ref Range Status  08/28/2020 152 (H) 0 - 149 mg/dL Final          Passed - Total Cholesterol in normal range and within 360 days    Cholesterol, Total  Date Value Ref Range Status  08/28/2020 164 100 - 199 mg/dL Final          Passed - LDL in normal range and within 360 days    LDL Chol Calc (NIH)  Date Value Ref Range Status  08/28/2020 97 0 - 99 mg/dL Final          Passed - HDL in normal range and within 360 days    HDL  Date Value Ref Range Status  08/28/2020 40 >39 mg/dL Final          Passed - Valid encounter within last 12 months    Recent Outpatient Visits           5 months ago Left knee pain, unspecified chronicity   [facility] Jerrol Banana., MD   7 months ago Chronic diarrhea   [facility] [place], Dionne Bucy, MD   9 months ago Chronic diarrhea   [facility] [place], Dionne Bucy, MD   11 months ago [place] Bacigalupo, Dionne Bucy, MD   1 year ago History of [place] Bacigalupo, Dionne Bucy, MD       Future Appointments             In 3 weeks Bacigalupo, Dionne Bucy, MD [facility], [place]   In 3 months Bacigalupo, Dionne Bucy, MD [facility], [place]

## 2021-09-13 ENCOUNTER — Ambulatory Visit (INDEPENDENT_AMBULATORY_CARE_PROVIDER_SITE_OTHER): Payer: 59 | Admitting: Family Medicine

## 2021-09-13 ENCOUNTER — Other Ambulatory Visit: Payer: Self-pay

## 2021-09-13 ENCOUNTER — Encounter: Payer: Self-pay | Admitting: Family Medicine

## 2021-09-13 DIAGNOSIS — F331 Major depressive disorder, recurrent, moderate: Secondary | ICD-10-CM

## 2021-09-13 DIAGNOSIS — K8681 Exocrine pancreatic insufficiency: Secondary | ICD-10-CM

## 2021-09-13 DIAGNOSIS — R7303 Prediabetes: Secondary | ICD-10-CM

## 2021-09-13 DIAGNOSIS — R152 Fecal urgency: Secondary | ICD-10-CM

## 2021-09-13 DIAGNOSIS — F410 Panic disorder [episodic paroxysmal anxiety] without agoraphobia: Secondary | ICD-10-CM

## 2021-09-13 DIAGNOSIS — E782 Mixed hyperlipidemia: Secondary | ICD-10-CM

## 2021-09-13 DIAGNOSIS — F411 Generalized anxiety disorder: Secondary | ICD-10-CM

## 2021-09-13 DIAGNOSIS — K449 Diaphragmatic hernia without obstruction or gangrene: Secondary | ICD-10-CM

## 2021-09-13 DIAGNOSIS — R159 Full incontinence of feces: Secondary | ICD-10-CM

## 2021-09-13 DIAGNOSIS — G4733 Obstructive sleep apnea (adult) (pediatric): Secondary | ICD-10-CM

## 2021-09-13 MED ORDER — ALPRAZOLAM 0.5 MG PO TABS: 0.2500 mg | ORAL_TABLET | ORAL | 2 refills | Status: DC | PRN

## 2021-09-13 NOTE — Progress Notes (Signed)
Established patient visit   Patient: Sandra Wilkins   DOB: October 31, 1966   55 y.o. Female  MRN: [id number] Visit Date: 09/13/2021  Today's healthcare provider: Lavon Paganini, MD   Chief Complaint  Patient presents with   Follow-up   Subjective    HPI  Follow up for fecal urgency  The patient was last seen for this 8 months ago. Changes made at last visit include no changes.  She reports excellent compliance with treatment. She feels that condition is Unchanged. She is not having side effects.   ----------------------------------------------------------------------------------------- Follow up for insomnia  The patient was last seen for this 8 months ago. Changes made at last visit include no changes.  She reports excellent compliance with treatment. She feels that condition is Improved. Patient reports sleeping around 8 hours with half a tablet of xanax every night.  She is not having side effects.  She reports using CPAP every night. ----------------------------------------------------------------------------------------- Joined a gym, but then got injured.      Medications: Outpatient Medications Prior to Visit  Medication Sig   amitriptyline (ELAVIL) 25 MG tablet TAKE 2 TABLETS BY MOUTH AT BEDTIME.   colestipol (COLESTID) 1 g tablet TAKE ONE TABLET (1GRAM) BY MOUTH TWICE DAILY   furosemide (LASIX) 20 MG tablet Take 2 tablets (40 mg total) by mouth daily. (Patient taking differently: Take 40 mg by mouth as needed.)   omeprazole (PRILOSEC) 20 MG capsule TAKE 1 CAPSULE BY MOUTH EVERY DAY   Pancrelipase, Lip-Prot-Amyl, (ZENPEP) 40000-126000 units CPEP Take 2 capsules with the first bite of each meal and 1 capsule with the first bite of each snack   sertraline (ZOLOFT) 100 MG tablet Take 1 tablet (100 mg total) by mouth 2 (two) times daily.   [DISCONTINUED] ALPRAZolam (XANAX) 0.5 MG tablet Take 0.5-1 tablets (0.25-0.5 mg total) by mouth every 4 (four) hours as  needed for anxiety. (Patient taking differently: Take 0.25-0.5 mg by mouth every 4 (four) hours as needed for sleep.)   No facility-administered medications prior to visit.    Review of Systems  Constitutional:  Positive for activity change, appetite change and fatigue.  Cardiovascular:  Positive for leg swelling. Negative for chest pain and palpitations.  Gastrointestinal:  Positive for abdominal pain and diarrhea.  Psychiatric/Behavioral:  Positive for decreased concentration and sleep disturbance. The patient is nervous/anxious.       Objective    BP 140/88 (BP Location: Left Arm, Patient Position: Sitting, Cuff Size: Large)   Pulse 74   Temp (!) 97.2 F (36.2 C) (Temporal)   Resp 16   Ht 5\' 2"  (1.575 m)   Wt (!) 307 lb 1.6 oz (139.3 kg)   SpO2 97%   BMI 56.17 kg/m  {Show previous vital signs (optional):23777}  Physical Exam Vitals reviewed.  Constitutional:      General: She is not in acute distress.    Appearance: Normal appearance. She is well-developed. She is not diaphoretic.  HENT:     Head: Normocephalic and atraumatic.  Eyes:     General: No scleral icterus.    Conjunctiva/sclera: Conjunctivae normal.  Neck:     Thyroid: No thyromegaly.  Cardiovascular:     Rate and Rhythm: Normal rate and regular rhythm.     Pulses: Normal pulses.     Heart sounds: Normal heart sounds. No murmur heard. Pulmonary:     Effort: Pulmonary effort is normal. No respiratory distress.     Breath sounds: Normal breath sounds. No wheezing, rhonchi  or rales.  Musculoskeletal:     Cervical back: Neck supple.     Right lower leg: No edema.     Left lower leg: No edema.  Lymphadenopathy:     Cervical: No cervical adenopathy.  Skin:    General: Skin is warm and dry.     Findings: No rash.  Neurological:     Mental Status: She is alert and oriented to person, place, and time. Mental status is at baseline.  Psychiatric:        Mood and Affect: Mood normal.        Behavior:  Behavior normal.      No results found for any visits on 09/13/21.  Assessment & Plan     Problem List Items Addressed This Visit       Respiratory   OSA (obstructive sleep apnea)    Using CPAP with good compliance Symptoms are better        Digestive   Exocrine pancreatic insufficiency    Doing fairly well She is f/b GI No changes today        Other   Anxiety disorder    A lot of stress lately Continue zoloft at current dose Increase Amitriptyline to 75 or 100mg   Qhs, maybe to replace Xanax for insomnia Prn Xanax Encourage therapy      Relevant Medications   ALPRAZolam (XANAX) 0.5 MG tablet   Pre-diabetes    Recommend low carb diet Recheck A1c       Relevant Orders   Hemoglobin A1c   Morbid obesity ([place]) - Primary    Discussed importance of healthy weight management Discussed diet and exercise Referral to Healthy Weight and Wellness      Relevant Orders   Hemoglobin A1c   Lipid panel   Comprehensive metabolic panel   Amb Ref to Medical Weight Management   CBC   Mixed hyperlipidemia    Reviewed last lipid panel Not currently on a statin Recheck FLP and CMP Discussed diet and exercise       Relevant Orders   Lipid panel   Comprehensive metabolic panel   Moderate episode of recurrent major depressive disorder (HCC)    Chronic and slightly worsened recently due to stressors Continue zoloft at current dose Encourage therapy Contracted for safety - no SI/HI      Relevant Medications   ALPRAZolam (XANAX) 0.5 MG tablet   Incontinence of feces with fecal urgency    F/b GI No changes today      Hiatal hernia    Noted incidentally on CT abd Small, type 1 Described what this was to the patient She is having GERD symptoms - continue PPI      Other Visit Diagnoses     Panic attacks       Relevant Medications   ALPRAZolam (XANAX) 0.5 MG tablet        Return in about 6 months (around 03/13/2022) for CPE.      Total time spent on  today's visit was greater than 340 minutes, including both face-to-face time and nonface-to-face time personally spent on review of chart (labs and imaging), discussing labs and goals, discussing further work-up, treatment options, referrals to specialist if needed, reviewing outside records of pertinent, answering patient's questions, and coordinating care.   I, Lavon Paganini, MD, have reviewed all documentation for this visit. The documentation on 09/13/21 for the exam, diagnosis, procedures, and orders are all accurate and complete.   [place], Dionne Bucy, MD, MPH [facility]  [facility]

## 2021-09-13 NOTE — Assessment & Plan Note (Signed)
F/b GI No changes today

## 2021-09-13 NOTE — Assessment & Plan Note (Signed)
Chronic and slightly worsened recently due to stressors Continue zoloft at current dose Encourage therapy Contracted for safety - no SI/HI

## 2021-09-13 NOTE — Assessment & Plan Note (Addendum)
A lot of stress lately Continue zoloft at current dose Increase Amitriptyline to 75 or 100mg   Qhs, maybe to replace Xanax for insomnia Prn Xanax Encourage therapy

## 2021-09-13 NOTE — Assessment & Plan Note (Signed)
Reviewed last lipid panel Not currently on a statin Recheck FLP and CMP Discussed diet and exercise  

## 2021-09-13 NOTE — Assessment & Plan Note (Signed)
Recommend low carb diet °Recheck A1c  °

## 2021-09-13 NOTE — Assessment & Plan Note (Signed)
Doing fairly well She is f/b GI No changes today

## 2021-09-13 NOTE — Assessment & Plan Note (Signed)
Noted incidentally on CT abd Small, type 1 Described what this was to the patient She is having GERD symptoms - continue PPI

## 2021-09-13 NOTE — Assessment & Plan Note (Signed)
Using CPAP with good compliance Symptoms are better

## 2021-09-13 NOTE — Assessment & Plan Note (Signed)
Discussed importance of healthy weight management Discussed diet and exercise Referral to Healthy Weight and Wellness

## 2021-09-14 LAB — COMPREHENSIVE METABOLIC PANEL
ALT: 78 IU/L — ABNORMAL HIGH (ref 0–32)
AST: 45 IU/L — ABNORMAL HIGH (ref 0–40)
Albumin/Globulin Ratio: 1.5 (ref 1.2–2.2)
Albumin: 4.1 g/dL (ref 3.8–4.9)
Alkaline Phosphatase: 127 IU/L — ABNORMAL HIGH (ref 44–121)
BUN/Creatinine Ratio: 13 (ref 9–23)
BUN: 14 mg/dL (ref 6–24)
Bilirubin Total: 0.4 mg/dL (ref 0.0–1.2)
CO2: 25 mmol/L (ref 20–29)
Calcium: 9 mg/dL (ref 8.7–10.2)
Chloride: 104 mmol/L (ref 96–106)
Creatinine, Ser: 1.08 mg/dL — ABNORMAL HIGH (ref 0.57–1.00)
Globulin, Total: 2.7 g/dL (ref 1.5–4.5)
Glucose: 103 mg/dL — ABNORMAL HIGH (ref 70–99)
Potassium: 4.1 mmol/L (ref 3.5–5.2)
Sodium: 141 mmol/L (ref 134–144)
Total Protein: 6.8 g/dL (ref 6.0–8.5)
eGFR: 61 mL/min/{1.73_m2} (ref >59)

## 2021-09-14 LAB — LIPID PANEL
Chol/HDL Ratio: 5.4 ratio — ABNORMAL HIGH (ref 0.0–4.4)
Cholesterol, Total: 190 mg/dL (ref 100–199)
HDL: 35 mg/dL — ABNORMAL LOW (ref >39)
LDL Chol Calc (NIH): 118 mg/dL — ABNORMAL HIGH (ref 0–99)
Triglycerides: 210 mg/dL — ABNORMAL HIGH (ref 0–149)
VLDL Cholesterol Cal: 37 mg/dL (ref 5–40)

## 2021-09-14 LAB — CBC
Hematocrit: 44.2 % (ref 34.0–46.6)
Hemoglobin: 14.7 g/dL (ref 11.1–15.9)
MCH: 29 pg (ref 26.6–33.0)
MCHC: 33.3 g/dL (ref 31.5–35.7)
MCV: 87 fL (ref 79–97)
Platelets: 216 10*3/uL (ref 150–450)
RBC: 5.07 x10E6/uL (ref 3.77–5.28)
RDW: 13.8 % (ref 11.7–15.4)
WBC: 6.5 10*3/uL (ref 3.4–10.8)

## 2021-09-14 LAB — HEMOGLOBIN A1C
Est. average glucose Bld gHb Est-mCnc: 123 mg/dL
Hgb A1c MFr Bld: 5.9 % — ABNORMAL HIGH (ref 4.8–5.6)

## 2021-10-08 ENCOUNTER — Other Ambulatory Visit: Payer: Self-pay | Admitting: Family Medicine

## 2021-10-08 ENCOUNTER — Other Ambulatory Visit: Payer: Self-pay | Admitting: Gastroenterology

## 2021-10-08 DIAGNOSIS — R519 Headache, unspecified: Secondary | ICD-10-CM

## 2021-10-08 DIAGNOSIS — K580 Irritable bowel syndrome with diarrhea: Secondary | ICD-10-CM

## 2021-10-09 NOTE — Telephone Encounter (Signed)
Requested Prescriptions  Pending Prescriptions Disp Refills  . sertraline (ZOLOFT) 100 MG tablet [Pharmacy Med Name: SERTRALINE HCL 100 MG TAB] 180 tablet 1    Sig: TAKE ONE TABLET (100 MG) BY MOUTH TWICE DAILY     Psychiatry:  Antidepressants - SSRI Passed - 10/08/2021  9:30 AM      Passed - Completed PHQ-2 or PHQ-9 in the last 360 days      Passed - Valid encounter within last 6 months    Recent Outpatient Visits          3 weeks ago Morbid obesity [facility])   [facility], Dionne Bucy, MD   7 months ago Left knee pain, unspecified chronicity   [facility] Jerrol Banana., MD   9 months ago Chronic diarrhea   [facility] [place], Dionne Bucy, MD   11 months ago Chronic diarrhea   [facility], Dionne Bucy, MD   1 year ago [place], Dionne Bucy, MD      Future Appointments            In 1 week Bacigalupo, Dionne Bucy, MD [facility] [place], [place]   In 1 month Bacigalupo, Dionne Bucy, MD [facility], [place]   In 5 months Bacigalupo, Dionne Bucy, MD [facility], [place]

## 2021-10-11 ENCOUNTER — Other Ambulatory Visit: Payer: Self-pay

## 2021-10-11 DIAGNOSIS — R519 Headache, unspecified: Secondary | ICD-10-CM

## 2021-10-11 DIAGNOSIS — K580 Irritable bowel syndrome with diarrhea: Secondary | ICD-10-CM

## 2021-10-11 MED ORDER — AMITRIPTYLINE HCL 25 MG PO TABS: 50.0000 mg | ORAL_TABLET | Freq: Every day | ORAL | 1 refills | Status: DC

## 2021-10-11 NOTE — Progress Notes (Signed)
Sent in refill as requested 

## 2021-10-15 ENCOUNTER — Encounter: Payer: Self-pay | Admitting: Family Medicine

## 2021-10-20 NOTE — Progress Notes (Signed)
Established patient visit   Patient: Sandra Wilkins   DOB: 1966/07/06   55 y.o. Female  MRN: [id number] Visit Date: 10/22/2021  Today's healthcare provider: Lavon Paganini, MD   Chief Complaint  Patient presents with   Numbness   I,Sulibeya S Dimas,acting as a scribe for Lavon Paganini, MD.,have documented all relevant documentation on the behalf of Lavon Paganini, MD,as directed by  Lavon Paganini, MD while in the presence of Lavon Paganini, MD.  Subjective    HPI  Patient here today C/O numbness and burning all over her body. She reports numbness present mostly at night. Patient reports symptoms have been present for 3 weeks. She Has not taken any OTC medications for symptoms. She reports not wanting to add to her daily medications. Patient reports leg weakness and has fallen twice recently.   stiffness and pins and needles starts at the feet  mostly at night cant get up and walk on it because it is numb  Medications: Outpatient Medications Prior to Visit  Medication Sig   ALPRAZolam (XANAX) 0.5 MG tablet Take 0.5-1 tablets (0.25-0.5 mg total) by mouth every 4 (four) hours as needed for anxiety.   amitriptyline (ELAVIL) 25 MG tablet TAKE TWO TABLETS (50 MG) BY MOUTH AT BEDTIME   amitriptyline (ELAVIL) 25 MG tablet Take 2 tablets (50 mg total) by mouth at bedtime.   colestipol (COLESTID) 1 g tablet TAKE ONE TABLET (1GRAM) BY MOUTH TWICE DAILY   furosemide (LASIX) 20 MG tablet Take 2 tablets (40 mg total) by mouth daily. (Patient taking differently: Take 40 mg by mouth as needed.)   omeprazole (PRILOSEC) 20 MG capsule TAKE 1 CAPSULE BY MOUTH EVERY DAY   Pancrelipase, Lip-Prot-Amyl, (ZENPEP) 40000-126000 units CPEP Take 2 capsules with the first bite of each meal and 1 capsule with the first bite of each snack   No facility-administered medications prior to visit.    Review of Systems  Constitutional:  Positive for activity change, appetite change and  fatigue.  Respiratory:  Negative for chest tightness and shortness of breath.   Cardiovascular:  Negative for chest pain and palpitations.  Musculoskeletal:  Positive for myalgias.  Neurological:  Positive for weakness and numbness.       Objective    BP 120/70 (BP Location: Right Arm, Patient Position: Sitting, Cuff Size: Large)   Pulse 82   Temp (!) 97.5 F (36.4 C) (Temporal)   Resp 16   Wt (!) 310 lb 8 oz (140.8 kg)   SpO2 97%   BMI 56.79 kg/m  BP Readings from Last 3 Encounters:  10/22/21 120/70  09/13/21 140/88  06/29/21 (!) 156/92   Wt Readings from Last 3 Encounters:  10/22/21 (!) 310 lb 8 oz (140.8 kg)  09/13/21 (!) 307 lb 1.6 oz (139.3 kg)  06/29/21 298 lb 2 oz (135.2 kg)      Physical Exam Vitals reviewed.  Constitutional:      General: She is not in acute distress.    Appearance: Normal appearance. She is well-developed. She is not diaphoretic.  HENT:     Head: Normocephalic and atraumatic.  Eyes:     General: No scleral icterus.    Conjunctiva/sclera: Conjunctivae normal.  Neck:     Thyroid: No thyromegaly.  Cardiovascular:     Rate and Rhythm: Normal rate and regular rhythm.     Pulses: Normal pulses.     Heart sounds: Normal heart sounds. No murmur heard. Pulmonary:     Effort:  Pulmonary effort is normal. No respiratory distress.     Breath sounds: Normal breath sounds. No wheezing, rhonchi or rales.  Musculoskeletal:     Cervical back: Neck supple.     Right lower leg: Edema present.     Left lower leg: Edema present.  Lymphadenopathy:     Cervical: No cervical adenopathy.  Skin:    General: Skin is warm and dry.     Findings: No rash.  Neurological:     Mental Status: She is alert and oriented to person, place, and time. Mental status is at baseline.     Sensory: No sensory deficit.     Motor: No weakness.  Psychiatric:        Mood and Affect: Mood normal.        Behavior: Behavior normal.      No results found for any visits on  10/22/21.  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      I, Lavon Paganini, MD, have reviewed all documentation for this visit. The documentation on 10/22/21 for the exam, diagnosis, procedures, and orders are all accurate and complete.   Aleksandar Duve, Dionne Bucy, MD, MPH [facility]

## 2021-10-22 ENCOUNTER — Ambulatory Visit (INDEPENDENT_AMBULATORY_CARE_PROVIDER_SITE_OTHER): Payer: 59 | Admitting: Family Medicine

## 2021-10-22 ENCOUNTER — Encounter: Payer: Self-pay | Admitting: Family Medicine

## 2021-10-22 ENCOUNTER — Other Ambulatory Visit: Payer: Self-pay

## 2021-10-22 VITALS — BP 120/70 | HR 82 | Temp 97.5°F | Resp 16 | Wt 310.5 lb

## 2021-10-22 DIAGNOSIS — G629 Polyneuropathy, unspecified: Secondary | ICD-10-CM | POA: Diagnosis not present

## 2021-10-22 DIAGNOSIS — Z1231 Encounter for screening mammogram for malignant neoplasm of breast: Secondary | ICD-10-CM

## 2021-10-22 DIAGNOSIS — K9049 Malabsorption due to intolerance, not elsewhere classified: Secondary | ICD-10-CM | POA: Diagnosis not present

## 2021-10-22 MED ORDER — GABAPENTIN 300 MG PO CAPS: 300.0000 mg | ORAL_CAPSULE | Freq: Every day | ORAL | 3 refills | Status: DC

## 2021-10-22 NOTE — Assessment & Plan Note (Signed)
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

## 2021-10-22 NOTE — Assessment & Plan Note (Signed)
Generalized peripheral neuropathy worse in feet, worse at night New problem x2-4 weeks Normal neuro exam today Suspect possible B12 deficiency as etiology - will check No alcohol intake, diabetes Start gabapentin 300mg  qhs Start B12 supplement if deficient If not improving or no underlying etiology found, will refer to neuro

## 2021-10-22 NOTE — Progress Notes (Deleted)
      Established patient visit   Patient: Sandra Wilkins   DOB: 1965/12/23   55 y.o. Female  MRN: [id number] Visit Date: 10/22/2021  Today's healthcare provider: Lavon Paganini, MD   Chief Complaint  Patient presents with   Numbness   Subjective    HPI  ***  Medications: Outpatient Medications Prior to Visit  Medication Sig   ALPRAZolam (XANAX) 0.5 MG tablet Take 0.5-1 tablets (0.25-0.5 mg total) by mouth every 4 (four) hours as needed for anxiety.   amitriptyline (ELAVIL) 25 MG tablet TAKE TWO TABLETS (50 MG) BY MOUTH AT BEDTIME   amitriptyline (ELAVIL) 25 MG tablet Take 2 tablets (50 mg total) by mouth at bedtime.   colestipol (COLESTID) 1 g tablet TAKE ONE TABLET (1GRAM) BY MOUTH TWICE DAILY   furosemide (LASIX) 20 MG tablet Take 2 tablets (40 mg total) by mouth daily. (Patient taking differently: Take 40 mg by mouth as needed.)   omeprazole (PRILOSEC) 20 MG capsule TAKE 1 CAPSULE BY MOUTH EVERY DAY   Pancrelipase, Lip-Prot-Amyl, (ZENPEP) 40000-126000 units CPEP Take 2 capsules with the first bite of each meal and 1 capsule with the first bite of each snack   No facility-administered medications prior to visit.    Review of Systems  {Labs  Heme  Chem  Endocrine  Serology  Results Review (optional):23779}   Objective    There were no vitals taken for this visit. {Show previous vital signs (optional):23777}  Physical Exam  ***  No results found for any visits on 10/22/21.  Assessment & Plan     Problem List Items Addressed This Visit   None    No follow-ups on file.      Nelva Nay, Medical Student 10/22/2021, 8:15 AM   {provider attestation***:1}   Lavon Paganini, MD  [facility] [phone] (phone) [phone] (fax)  [place]

## 2021-10-23 LAB — VITAMIN B12: Vitamin B-12: 422 pg/mL (ref 232–1245)

## 2021-10-26 ENCOUNTER — Other Ambulatory Visit: Payer: Self-pay | Admitting: Family Medicine

## 2021-10-27 ENCOUNTER — Encounter: Payer: Self-pay | Admitting: Family Medicine

## 2021-10-27 DIAGNOSIS — G629 Polyneuropathy, unspecified: Secondary | ICD-10-CM

## 2021-10-27 DIAGNOSIS — R519 Headache, unspecified: Secondary | ICD-10-CM

## 2021-10-27 DIAGNOSIS — K580 Irritable bowel syndrome with diarrhea: Secondary | ICD-10-CM

## 2021-10-28 MED ORDER — AMITRIPTYLINE HCL 25 MG PO TABS: 75.0000 mg | ORAL_TABLET | Freq: Every day | ORAL | 1 refills | Status: DC

## 2021-11-16 ENCOUNTER — Telehealth: Payer: Self-pay

## 2021-11-16 MED ORDER — GABAPENTIN 300 MG PO CAPS: 300.0000 mg | ORAL_CAPSULE | Freq: Two times a day (BID) | ORAL | 1 refills | Status: DC

## 2021-11-16 NOTE — Telephone Encounter (Signed)
Updated prescription sent to pharmacy.

## 2021-11-22 NOTE — Progress Notes (Signed)
Complete physical exam   Patient: Sandra Wilkins   DOB: Nov 24, 1965   56 y.o. Female  MRN: [id number] Visit Date: 11/23/2021  Today's healthcare provider: Lavon Paganini, MD   Chief Complaint  Patient presents with   Annual Exam   Subjective    Sandra Wilkins is a 56 y.o. female who presents today for a complete physical exam.  She reports consuming a low sodium and more sugar free drinks  diet. Home exercise routine includes chair Yoga. She generally feels fairly well. She reports sleeping fairly well, but still wakes up frequently. She does not have additional problems to discuss today.  HPI   Upcoming Neuro appt in 3/23. Gabapentin is helping with her neuropathic pain.  May try 600mg  qhs to help with sleep.   Past Medical History:  Diagnosis Date   Anxiety    Arthritis    DDD-NECK   COVID-19 11/04/2019   Depression    GERD (gastroesophageal reflux disease)    Headache    MIGRAINES   Hypertension    H/O WAS ON FUROSEMIDE IN THE PAST BUT LOST WEIGHT AND WAS TAKEN OFF DUE TO BP CONTROL   Pneumonia due to COVID-19 virus 11/03/2019   Pre-diabetes    Sleep apnea    CPAP   Past Surgical History:  Procedure Laterality Date   BREAST BIOPSY Right 2012   benign   CHOLECYSTECTOMY  04/17/2017   Procedure: CHOLECYSTECTOMY;  Surgeon: Robert Bellow, MD;  Location: [facility];  Service: General;;  Laparoscopic converted to open   COLONOSCOPY WITH PROPOFOL N/A 07/28/2020   Procedure: COLONOSCOPY WITH PROPOFOL;  Surgeon: Lin Landsman, MD;  Location: [facility];  Service: Gastroenterology;  Laterality: N/A;   ERCP N/A 04/13/2017   Procedure: ENDOSCOPIC RETROGRADE CHOLANGIOPANCREATOGRAPHY (ERCP);  Surgeon: Lucilla Lame, MD;  Location: [facility] ENDOSCOPY;  Service: Endoscopy;  Laterality: N/A;   ERCP N/A 06/20/2017   Procedure: ENDOSCOPIC RETROGRADE CHOLANGIOPANCREATOGRAPHY (ERCP) STENT REMOVAL;  Surgeon: Lucilla Lame, MD;  Location: [facility] ENDOSCOPY;  Service:  Endoscopy;  Laterality: N/A;   ESOPHAGOGASTRODUODENOSCOPY (EGD) WITH PROPOFOL N/A 07/28/2020   Procedure: ESOPHAGOGASTRODUODENOSCOPY (EGD) WITH PROPOFOL;  Surgeon: Lin Landsman, MD;  Location[facility] ENDOSCOPY;  Service: Gastroenterology;  Laterality: N/A;   TONSILLECTOMY AND ADENOIDECTOMY  1972   Social History   Socioeconomic History   Marital status: Married    Spouse name: Not on file   Number of children: Not on file   Years of education: Not on file   Highest education level: Not on file  Occupational History   Occupation: full time  Tobacco Use   Smoking status: Former    Packs/day: 0.50    Years: 20.00    Pack years: 10.00    Types: Cigarettes    Quit date: 11/13/2005    Years since quitting: 16.0   Smokeless tobacco: Never  Vaping Use   Vaping Use: Never used  Substance and Sexual Activity   Alcohol use: Not Currently    Comment: occasional    Drug use: No   Sexual activity: Not Currently    Birth control/protection: None  Other Topics Concern   Not on file  Social History Narrative   Not on file   Social Determinants of Health   Financial Resource Strain: Not on file  Food Insecurity: Not on file  Transportation Needs: Not on file  Physical Activity: Not on file  Stress: Not on file  Social Connections: Not on file  Intimate Partner Violence: Not  on file   Family Status  Relation Name Status   Mother  (Not Specified)   Father  Alive   Sister  Alive   Sister  Alive   Brother  Alive   MGM  (Not Specified)   MGF  Deceased   PGF  Deceased   Neg Hx  (Not Specified)   Family History  Problem Relation Age of Onset   Depression Mother    Arthritis Mother    Emphysema Mother    Congestive Heart Failure Mother    Hyperlipidemia Father    Heart attack Father    Depression Sister    Depression Sister    Bipolar disorder Brother    Seizures Brother    Arthritis Maternal Grandmother    Asthma Maternal  Grandmother    Congestive Heart Failure Maternal Grandfather    Dementia Paternal Grandfather    Breast cancer Neg Hx    Ovarian cancer Neg Hx    Colon cancer Neg Hx    Allergies  Allergen Reactions   Codeine Hives   Other Hives and Itching    Metronidazole or Ciprofloxacin     Sulfa Antibiotics Hives and Itching   Tramadol Other (See Comments)    Hallucinations   Penicillins Rash    Has patient had a PCN reaction causing immediate rash, facial/tongue/throat swelling, SOB or lightheadedness with hypotension: Unknown Has patient had a PCN reaction causing severe rash involving mucus membranes or skin necrosis: Unknown Has patient had a PCN reaction that required hospitalization: Unknown Has patient had a PCN reaction occurring within the last 10 years: No If all of the above answers are "NO", then may proceed with Cephalosporin use.     Patient Care Team: Virginia Crews, MD as PCP - General (Family Medicine) Josefine Class, MD as Referring Physician (Gastroenterology)   Medications: Outpatient Medications Prior to Visit  Medication Sig   ALPRAZolam (XANAX) 0.5 MG tablet Take 0.5-1 tablets (0.25-0.5 mg total) by mouth every 4 (four) hours as needed for anxiety.   colestipol (COLESTID) 1 g tablet TAKE ONE TABLET (1GRAM) BY MOUTH TWICE DAILY   furosemide (LASIX) 20 MG tablet Take 2 tablets (40 mg total) by mouth daily. (Patient taking differently: Take 40 mg by mouth as needed.)   gabapentin (NEURONTIN) 300 MG capsule Take 1 capsule (300 mg total) by mouth 2 (two) times daily.   omeprazole (PRILOSEC) 20 MG capsule TAKE 1 CAPSULE (20 MG) BY MOUTH EVERY DAY   Pancrelipase, Lip-Prot-Amyl, (ZENPEP) 40000-126000 units CPEP Take 2 capsules with the first bite of each meal and 1 capsule with the first bite of each snack   [DISCONTINUED] amitriptyline (ELAVIL) 25 MG tablet Take 3-4 tablets (75-100 mg total) by mouth at bedtime.   No facility-administered  medications prior to visit.    Review of Systems  Constitutional:  Positive for activity change, fatigue and unexpected weight change.  Eyes:  Positive for photophobia and visual disturbance.  Respiratory:  Positive for apnea.   Cardiovascular:  Positive for leg swelling.  Gastrointestinal:  Positive for abdominal distention, abdominal pain, diarrhea and nausea.  Endocrine: Positive for heat intolerance.  Genitourinary:  Positive for enuresis and vaginal bleeding.  Musculoskeletal:  Positive for arthralgias, back pain, gait problem, joint swelling, myalgias, neck pain and neck stiffness.  Allergic/Immunologic: Positive for environmental allergies.  Neurological:  Positive for dizziness, weakness, numbness and headaches.  Psychiatric/Behavioral:  Positive for decreased concentration and sleep disturbance.      Objective  BP 140/90 (BP Location: Left Arm, Cuff Size: Large)    Pulse 78    Resp 16    Ht 5\' 2"  (1.575 m)    Wt (!) 320 lb (145.2 kg)    BMI 58.53 kg/m    Physical Exam Vitals reviewed.  Constitutional:      General: She is not in acute distress.    Appearance: Normal appearance. She is well-developed. She is not diaphoretic.  HENT:     Head: Normocephalic and atraumatic.     Right Ear: Tympanic membrane, ear canal and external ear normal.     Left Ear: Tympanic membrane, ear canal and external ear normal.     Nose: Nose normal.     Mouth/Throat:     Mouth: Mucous membranes are moist.     Pharynx: Oropharynx is clear. No oropharyngeal exudate.  Eyes:     General: No scleral icterus.    Conjunctiva/sclera: Conjunctivae normal.     Pupils: Pupils are equal, round, and reactive to light.  Neck:     Thyroid: No thyromegaly.  Cardiovascular:     Rate and Rhythm: Normal rate and regular rhythm.     Pulses: Normal pulses.     Heart sounds: Normal heart sounds. No murmur heard. Pulmonary:     Effort: Pulmonary effort is normal. No respiratory distress.     Breath  sounds: Normal breath sounds. No wheezing or rales.  Abdominal:     General: There is no distension.     Palpations: Abdomen is soft.     Tenderness: There is no abdominal tenderness.  Musculoskeletal:        General: No deformity.     Cervical back: Neck supple.     Right lower leg: No edema.     Left lower leg: No edema.  Lymphadenopathy:     Cervical: No cervical adenopathy.  Skin:    General: Skin is warm and dry.     Findings: No rash.  Neurological:     Mental Status: She is alert and oriented to person, place, and time. Mental status is at baseline.     Sensory: No sensory deficit.     Motor: No weakness.     Gait: Gait normal.  Psychiatric:        Mood and Affect: Mood normal.        Behavior: Behavior normal.        Thought Content: Thought content normal.      Last depression screening scores PHQ 2/9 Scores 11/23/2021 10/22/2021 09/13/2021  PHQ - 2 Score 5 1 3   PHQ- 9 Score 21 13 17    Last fall risk screening Fall Risk  11/23/2021  Falls in the past year? 1  Number falls in past yr: 1  Injury with Fall? 0  Risk for fall due to : -  Follow up -   Last Audit-C alcohol use screening Alcohol Use Disorder Test (AUDIT) 11/23/2021  1. How often do you have a drink containing alcohol? 0  2. How many drinks containing alcohol do you have on a typical day when you are drinking? 0  3. How often do you have six or more drinks on one occasion? 0  AUDIT-C Score 0  Alcohol Brief Interventions/Follow-up -   A score of 3 or more in women, and 4 or more in men indicates increased risk for alcohol abuse, EXCEPT if all of the points are from question 1   No results found for any visits on  11/23/21.  Assessment & Plan    Routine Health Maintenance and Physical Exam  Exercise Activities and Dietary recommendations  Goals   None     Immunization History  Administered Date(s) Administered   Influenza Inj Mdck Quad Pf 10/04/2019, 09/01/2021   Influenza Split 09/05/2006    Influenza, High Dose Seasonal PF 08/04/2016   Influenza,inj,Quad PF,6+ Mos 09/30/2014, 10/21/2015, 09/06/2017, 10/17/2018, 08/28/2020   Influenza-Unspecified 02/14/2018, 09/01/2021   PFIZER(Purple Top)SARS-COV-2 Vaccination 01/23/2020, 02/18/2020   Pfizer Covid-19 Vaccine Bivalent Booster 52yrs & up 09/01/2021   Tdap 02/14/2018    Health Maintenance  Topic Date Due   Pneumococcal Vaccine 33-32 Years old (1 - PCV) Never done   Zoster Vaccines- Shingrix (1 of 2) Never done   MAMMOGRAM  10/13/2017   PAP SMEAR-Modifier  08/02/2023   TETANUS/TDAP  02/15/2028   COLONOSCOPY (Pts 45-98yrs Insurance coverage will need to be confirmed)  07/28/2030   INFLUENZA VACCINE  Completed   COVID-19 Vaccine  Completed   Hepatitis C Screening  Completed   HIV Screening  Completed   HPV VACCINES  Aged Out    Discussed health benefits of physical activity, and encouraged her to engage in regular exercise appropriate for her age and condition.  Problem List Items Addressed This Visit       Other   Morbid obesity ([place])    Discussed importance of healthy weight management Discussed diet and exercise       Other Visit Diagnoses     Encounter for annual physical exam    -  Primary   Nonintractable episodic headache, unspecified headache type       Relevant Medications   amitriptyline (ELAVIL) 25 MG tablet        Return in about 6 months (around 05/23/2022) for chronic disease f/u.     I, Lavon Paganini, MD, have reviewed all documentation for this visit. The documentation on 11/23/21 for the exam, diagnosis, procedures, and orders are all accurate and complete.   Amore Grater, Dionne Bucy, MD, MPH [facility]

## 2021-11-23 ENCOUNTER — Ambulatory Visit (INDEPENDENT_AMBULATORY_CARE_PROVIDER_SITE_OTHER): Payer: Managed Care, Other (non HMO) | Admitting: Family Medicine

## 2021-11-23 ENCOUNTER — Encounter: Payer: Self-pay | Admitting: Family Medicine

## 2021-11-23 ENCOUNTER — Other Ambulatory Visit: Payer: Self-pay

## 2021-11-23 VITALS — BP 140/90 | HR 78 | Resp 16 | Ht 62.0 in | Wt 320.0 lb

## 2021-11-23 DIAGNOSIS — Z0000 Encounter for general adult medical examination without abnormal findings: Secondary | ICD-10-CM

## 2021-11-23 DIAGNOSIS — R519 Headache, unspecified: Secondary | ICD-10-CM | POA: Diagnosis not present

## 2021-11-23 MED ORDER — AMITRIPTYLINE HCL 25 MG PO TABS: 50.0000 mg | ORAL_TABLET | Freq: Every day | ORAL | 1 refills | Status: DC

## 2021-11-23 NOTE — Patient Instructions (Signed)
   The CDC recommends two doses of Shingrix (the shingles vaccine) separated by 2 to 6 months for adults age 55 years and older. I recommend checking with your insurance plan regarding coverage for this vaccine.   

## 2021-11-23 NOTE — Assessment & Plan Note (Signed)
Discussed importance of healthy weight management Discussed diet and exercise  

## 2021-12-01 ENCOUNTER — Encounter: Payer: Self-pay | Admitting: Family Medicine

## 2021-12-21 NOTE — Telephone Encounter (Signed)
Recommend she schedule a virtual visit with one of the Pas or a follow up visit with Dr. B when she returns.

## 2022-01-17 DIAGNOSIS — R159 Full incontinence of feces: Secondary | ICD-10-CM | POA: Diagnosis not present

## 2022-01-17 DIAGNOSIS — G4733 Obstructive sleep apnea (adult) (pediatric): Secondary | ICD-10-CM | POA: Diagnosis not present

## 2022-01-17 DIAGNOSIS — Z6841 Body Mass Index (BMI) 40.0 and over, adult: Secondary | ICD-10-CM | POA: Diagnosis not present

## 2022-01-17 DIAGNOSIS — R69 Illness, unspecified: Secondary | ICD-10-CM | POA: Diagnosis not present

## 2022-01-17 DIAGNOSIS — R202 Paresthesia of skin: Secondary | ICD-10-CM | POA: Diagnosis not present

## 2022-01-17 DIAGNOSIS — R32 Unspecified urinary incontinence: Secondary | ICD-10-CM | POA: Diagnosis not present

## 2022-01-17 DIAGNOSIS — G43109 Migraine with aura, not intractable, without status migrainosus: Secondary | ICD-10-CM | POA: Diagnosis not present

## 2022-01-17 DIAGNOSIS — Z114 Encounter for screening for human immunodeficiency virus [HIV]: Secondary | ICD-10-CM | POA: Diagnosis not present

## 2022-01-17 LAB — VITAMIN D 25 HYDROXY (VIT D DEFICIENCY, FRACTURES): Vit D, 25-Hydroxy: 24.2

## 2022-01-17 LAB — TSH: TSH: 3.34 (ref 0.41–5.90)

## 2022-01-18 ENCOUNTER — Other Ambulatory Visit: Payer: Self-pay | Admitting: Neurology

## 2022-01-18 DIAGNOSIS — G43109 Migraine with aura, not intractable, without status migrainosus: Secondary | ICD-10-CM

## 2022-01-28 ENCOUNTER — Other Ambulatory Visit: Payer: Self-pay | Admitting: Neurology

## 2022-01-28 DIAGNOSIS — G43109 Migraine with aura, not intractable, without status migrainosus: Secondary | ICD-10-CM

## 2022-01-31 ENCOUNTER — Encounter: Payer: Self-pay | Admitting: Family Medicine

## 2022-02-16 NOTE — Progress Notes (Signed)
?  ? ?I,Sulibeya S Dimas,acting as a scribe for Lavon Paganini, MD.,have documented all relevant documentation on the behalf of Lavon Paganini, MD,as directed by  Lavon Paganini, MD while in the presence of Lavon Paganini, MD. ? ? ?Established patient visit ? ? ?Patient: Sandra Wilkins   DOB: 1966/02/25   56 y.o. Female  MRN: [id number] ?Visit Date: 02/17/2022 ? ?Today's healthcare provider: Lavon Paganini, MD  ? ?Chief Complaint  ?Patient presents with  ? Follow-up  ? Obesity  ? ?Subjective  ?  ?HPI  ?Follow up for elevated blood pressure ? ?The patient was last seen for this 3 months ago. ?Changes made at last visit include no changes. Patient to continue working on diet and exercise. ? ?She reports excellent compliance with treatment. ?She feels that condition is Improved. ? ?BP Readings from Last 3 Encounters:  ?02/17/22 130/85  ?11/23/21 140/90  ?10/22/21 120/70  ? ? ?----------------------------------------------------------------------------------------- ?Follow up for obesity ? ?The patient was last seen for this 3 months ago. ?Changes made at last visit include discussed importance of healthy weight management, diet and exercise. ? ?She reports excellent compliance with treatment. Patient reports she has signed up to receive wellness meal kits to help with diet. ?She feels that condition is Improved. ?She is not having side effects. ? ?Wt Readings from Last 3 Encounters:  ?02/17/22 (!) 315 lb 12.8 oz (143.2 kg)  ?11/23/21 (!) 320 lb (145.2 kg)  ?10/22/21 (!) 310 lb 8 oz (140.8 kg)  ? ?+dysuria and urinary incontinence - wants to check for UTI ?----------------------------------------------------------------------------------------- ? ? ?Medications: ?Outpatient Medications Prior to Visit  ?Medication Sig  ? ALPRAZolam (XANAX) 0.5 MG tablet Take 0.5-1 tablets (0.25-0.5 mg total) by mouth every 4 (four) hours as needed for anxiety.  ? colestipol (COLESTID) 1 g tablet TAKE ONE TABLET (1GRAM) BY  MOUTH TWICE DAILY  ? furosemide (LASIX) 20 MG tablet Take 2 tablets (40 mg total) by mouth daily. (Patient taking differently: Take 40 mg by mouth as needed.)  ? omeprazole (PRILOSEC) 20 MG capsule TAKE 1 CAPSULE (20 MG) BY MOUTH EVERY DAY  ? Pancrelipase, Lip-Prot-Amyl, (ZENPEP) 40000-126000 units CPEP Take 2 capsules with the first bite of each meal and 1 capsule with the first bite of each snack  ? topiramate (TOPAMAX) 50 MG tablet Take 50 mg by mouth at bedtime.  ? [DISCONTINUED] amitriptyline (ELAVIL) 25 MG tablet Take 2 tablets (50 mg total) by mouth at bedtime.  ? [DISCONTINUED] gabapentin (NEURONTIN) 300 MG capsule Take 1 capsule (300 mg total) by mouth 2 (two) times daily.  ? ?No facility-administered medications prior to visit.  ? ? ?Review of Systems  ?Constitutional:  Positive for activity change, appetite change, fatigue and unexpected weight change.  ?Respiratory:  Negative for chest tightness and shortness of breath.   ?Cardiovascular:  Negative for chest pain and palpitations.  ?Musculoskeletal:  Positive for gait problem and myalgias.  ? ?Last metabolic panel ?Lab Results  ?Component Value Date  ? GLUCOSE 103 (H) 09/13/2021  ? NA 141 09/13/2021  ? K 4.1 09/13/2021  ? CL 104 09/13/2021  ? CO2 25 09/13/2021  ? BUN 14 09/13/2021  ? CREATININE 1.08 (H) 09/13/2021  ? EGFR 61 09/13/2021  ? CALCIUM 9.0 09/13/2021  ? PROT 6.8 09/13/2021  ? ALBUMIN 4.1 09/13/2021  ? LABGLOB 2.7 09/13/2021  ? AGRATIO 1.5 09/13/2021  ? BILITOT 0.4 09/13/2021  ? ALKPHOS 127 (H) 09/13/2021  ? AST 45 (H) 09/13/2021  ? ALT 78 (H) 09/13/2021  ?  ANIONGAP 10 11/06/2019  ? ?Last lipids ?Lab Results  ?Component Value Date  ? CHOL 190 09/13/2021  ? HDL 35 (L) 09/13/2021  ? LDLCALC 118 (H) 09/13/2021  ? TRIG 210 (H) 09/13/2021  ? CHOLHDL 5.4 (H) 09/13/2021  ? ?Last hemoglobin A1c ?Lab Results  ?Component Value Date  ? HGBA1C 5.9 (H) 09/13/2021  ? ?Last thyroid functions ?Lab Results  ?Component Value Date  ? TSH 3.170 09/26/2019  ?  T4TOTAL 6.3 07/22/2016  ? ?  ?  Objective  ?  ?BP 130/85 (BP Location: Left Arm, Patient Position: Sitting, Cuff Size: Large)   Pulse 78   Temp 98.1 ?F (36.7 ?C) (Oral)   Resp 20   Ht '5\' 2"'  (1.575 m)   Wt (!) 315 lb 12.8 oz (143.2 kg)   SpO2 97%   BMI 57.76 kg/m?  ?BP Readings from Last 3 Encounters:  ?02/17/22 130/85  ?11/23/21 140/90  ?10/22/21 120/70  ? ?Wt Readings from Last 3 Encounters:  ?02/17/22 (!) 315 lb 12.8 oz (143.2 kg)  ?11/23/21 (!) 320 lb (145.2 kg)  ?10/22/21 (!) 310 lb 8 oz (140.8 kg)  ? ?  ? ?Physical Exam ?Vitals reviewed.  ?Constitutional:   ?   General: She is not in acute distress. ?   Appearance: Normal appearance. She is well-developed. She is not diaphoretic.  ?HENT:  ?   Head: Normocephalic and atraumatic.  ?Eyes:  ?   General: No scleral icterus. ?   Conjunctiva/sclera: Conjunctivae normal.  ?Neck:  ?   Thyroid: No thyromegaly.  ?Cardiovascular:  ?   Rate and Rhythm: Normal rate and regular rhythm.  ?   Pulses: Normal pulses.  ?   Heart sounds: Normal heart sounds. No murmur heard. ?Pulmonary:  ?   Effort: Pulmonary effort is normal. No respiratory distress.  ?   Breath sounds: Normal breath sounds. No wheezing, rhonchi or rales.  ?Musculoskeletal:  ?   Cervical back: Neck supple.  ?   Right lower leg: Edema present.  ?   Left lower leg: Edema present.  ?Lymphadenopathy:  ?   Cervical: No cervical adenopathy.  ?Skin: ?   General: Skin is warm and dry.  ?   Findings: No rash.  ?Neurological:  ?   Mental Status: She is alert and oriented to person, place, and time. Mental status is at baseline.  ?Psychiatric:     ?   Mood and Affect: Mood normal.     ?   Behavior: Behavior normal.  ?  ? ? ?Results for orders placed or performed in visit on 02/17/22  ?POCT Urinalysis Dipstick  ?Result Value Ref Range  ? Color, UA yellow   ? Clarity, UA clear   ? Glucose, UA Negative Negative  ? Bilirubin, UA negative   ? Ketones, UA negative   ? Spec Grav, UA 1.020 1.010 - 1.025  ? Blood, UA negative    ? pH, UA 6.0 5.0 - 8.0  ? Protein, UA Positive (A) Negative  ? Urobilinogen, UA 0.2 0.2 or 1.0 E.U./dL  ? Nitrite, UA negative   ? Leukocytes, UA Negative Negative  ? Appearance    ? Odor    ? ? Assessment & Plan  ?  ? ?Problem List Items Addressed This Visit   ? ?  ? Digestive  ? Nonalcoholic fatty liver disease without nonalcoholic steatohepatitis ([facility])  ?  Continue to monitor LFTs ?Avoid alcohol ?Encourage diet and exercise ?  ?  ? Relevant Orders  ? Comprehensive  metabolic panel  ?  ? Musculoskeletal and Integument  ? Eczema  ?  Saw pictures of rash and looks consistent with eczema ?Triamcinolone prn ?  ?  ?  ? Other  ? Pre-diabetes  ?  Recommend low carb diet ?Recheck A1c  ?  ?  ? Relevant Orders  ? Hemoglobin A1c  ? Morbid obesity ([place]) - Primary  ?  Congratulated on weight loss ?Discussed importance of healthy weight management ?Discussed diet and exercise  ?She reports that her insurance does not cover weight loss medications and she would not consider bariatric surgery ?We also discussed that Ozempic/Wegovy would not be a great option for her given that she has so many GI symptoms currently and this could worsen them ?  ?  ? Mixed hyperlipidemia  ?  Reviewed last lipid panel ?Not currently on a statin ?Recheck FLP and CMP ?Discussed diet and exercise  ?  ?  ? Relevant Orders  ? Comprehensive metabolic panel  ? Lipid panel  ? ?Other Visit Diagnoses   ? ? Dysuria      ? Relevant Orders  ? POCT Urinalysis Dipstick (Completed)  ? Urine Culture  ? ?  ?  ? ?Return in about 6 months (around 08/19/2022) for chronic disease f/u.  ?   ? ?I, Lavon Paganini, MD, have reviewed all documentation for this visit. The documentation on 02/17/22 for the exam, diagnosis, procedures, and orders are all accurate and complete. ? ? ?Virginia Crews, MD, MPH ?[place] ?[facility]   ?

## 2022-02-17 ENCOUNTER — Ambulatory Visit (INDEPENDENT_AMBULATORY_CARE_PROVIDER_SITE_OTHER): Payer: 59 | Admitting: Family Medicine

## 2022-02-17 ENCOUNTER — Encounter: Payer: Self-pay | Admitting: Family Medicine

## 2022-02-17 DIAGNOSIS — R7303 Prediabetes: Secondary | ICD-10-CM | POA: Diagnosis not present

## 2022-02-17 DIAGNOSIS — K760 Fatty (change of) liver, not elsewhere classified: Secondary | ICD-10-CM | POA: Diagnosis not present

## 2022-02-17 DIAGNOSIS — E782 Mixed hyperlipidemia: Secondary | ICD-10-CM | POA: Diagnosis not present

## 2022-02-17 DIAGNOSIS — R300 Dysuria: Secondary | ICD-10-CM

## 2022-02-17 DIAGNOSIS — L309 Dermatitis, unspecified: Secondary | ICD-10-CM

## 2022-02-17 LAB — POCT URINALYSIS DIPSTICK
Bilirubin, UA: NEGATIVE
Blood, UA: NEGATIVE
Glucose, UA: NEGATIVE
Ketones, UA: NEGATIVE
Leukocytes, UA: NEGATIVE
Nitrite, UA: NEGATIVE
Protein, UA: POSITIVE — AB
Spec Grav, UA: 1.02 (ref 1.010–1.025)
Urobilinogen, UA: 0.2 E.U./dL
pH, UA: 6 (ref 5.0–8.0)

## 2022-02-17 MED ORDER — TRIAMCINOLONE ACETONIDE 0.5 % EX OINT: 1.0000 "application " | TOPICAL_OINTMENT | Freq: Two times a day (BID) | CUTANEOUS | 2 refills | Status: AC

## 2022-02-17 NOTE — Assessment & Plan Note (Signed)
Continue to monitor LFTs ?Avoid alcohol ?Encourage diet and exercise ?

## 2022-02-17 NOTE — Assessment & Plan Note (Addendum)
Congratulated on weight loss ?Discussed importance of healthy weight management ?Discussed diet and exercise  ?She reports that her insurance does not cover weight loss medications and she would not consider bariatric surgery ?We also discussed that Ozempic/Wegovy would not be a great option for her given that she has so many GI symptoms currently and this could worsen them ?

## 2022-02-17 NOTE — Assessment & Plan Note (Signed)
Recommend low carb diet °Recheck A1c  °

## 2022-02-17 NOTE — Assessment & Plan Note (Signed)
Saw pictures of rash and looks consistent with eczema ?Triamcinolone prn ?

## 2022-02-17 NOTE — Assessment & Plan Note (Signed)
Reviewed last lipid panel Not currently on a statin Recheck FLP and CMP Discussed diet and exercise  

## 2022-02-19 LAB — URINE CULTURE

## 2022-03-03 ENCOUNTER — Ambulatory Visit: Payer: Self-pay | Admitting: Family Medicine

## 2022-03-08 DIAGNOSIS — R29898 Other symptoms and signs involving the musculoskeletal system: Secondary | ICD-10-CM | POA: Diagnosis not present

## 2022-03-09 ENCOUNTER — Other Ambulatory Visit: Payer: Self-pay | Admitting: Neurology

## 2022-03-09 DIAGNOSIS — R202 Paresthesia of skin: Secondary | ICD-10-CM

## 2022-03-09 DIAGNOSIS — G43109 Migraine with aura, not intractable, without status migrainosus: Secondary | ICD-10-CM

## 2022-03-10 ENCOUNTER — Encounter: Payer: Self-pay | Admitting: Family Medicine

## 2022-03-11 ENCOUNTER — Ambulatory Visit: Payer: 59 | Admitting: Family Medicine

## 2022-03-15 DIAGNOSIS — R202 Paresthesia of skin: Secondary | ICD-10-CM | POA: Diagnosis not present

## 2022-03-21 DIAGNOSIS — Z9989 Dependence on other enabling machines and devices: Secondary | ICD-10-CM | POA: Diagnosis not present

## 2022-03-21 DIAGNOSIS — M542 Cervicalgia: Secondary | ICD-10-CM | POA: Diagnosis not present

## 2022-03-21 DIAGNOSIS — G4733 Obstructive sleep apnea (adult) (pediatric): Secondary | ICD-10-CM | POA: Diagnosis not present

## 2022-03-21 DIAGNOSIS — R159 Full incontinence of feces: Secondary | ICD-10-CM | POA: Diagnosis not present

## 2022-03-21 DIAGNOSIS — R32 Unspecified urinary incontinence: Secondary | ICD-10-CM | POA: Diagnosis not present

## 2022-03-21 DIAGNOSIS — R202 Paresthesia of skin: Secondary | ICD-10-CM | POA: Diagnosis not present

## 2022-03-21 DIAGNOSIS — G43109 Migraine with aura, not intractable, without status migrainosus: Secondary | ICD-10-CM | POA: Diagnosis not present

## 2022-04-08 ENCOUNTER — Other Ambulatory Visit: Payer: Self-pay | Admitting: Family Medicine

## 2022-04-12 ENCOUNTER — Ambulatory Visit
Admission: RE | Admit: 2022-04-12 | Discharge: 2022-04-12 | Disposition: A | Discharge status: Home / Self Care | Payer: 59 | Source: Ambulatory Visit | Attending: Family Medicine | Admitting: Family Medicine

## 2022-04-12 DIAGNOSIS — Z1231 Encounter for screening mammogram for malignant neoplasm of breast: Secondary | ICD-10-CM | POA: Insufficient documentation

## 2022-04-12 IMAGING — MG MM DIGITAL SCREENING BILAT W/ TOMO AND CAD
8 series · 8 of 24 positions shown · non-contrast
Comparison: Previous exam(s).

CLINICAL DATA: Screening.

EXAM:
DIGITAL SCREENING BILATERAL MAMMOGRAM WITH TOMOSYNTHESIS AND CAD
TECHNIQUE: Bilateral screening digital craniocaudal and mediolateral oblique
mammograms were obtained. Bilateral screening digital breast
tomosynthesis was performed.

[R CC synth-2D]
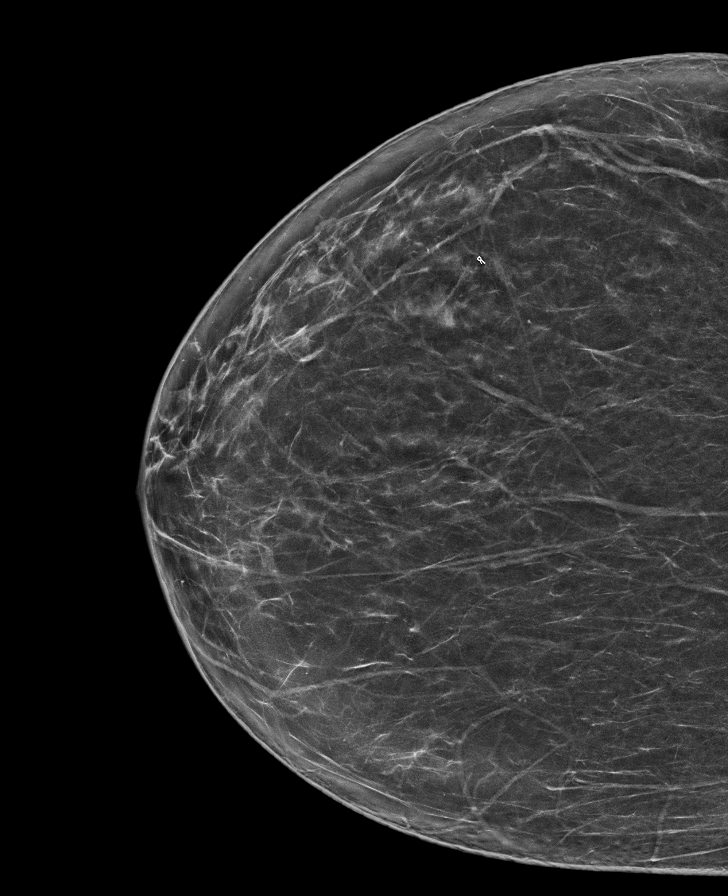

[R MLO synth-2D]
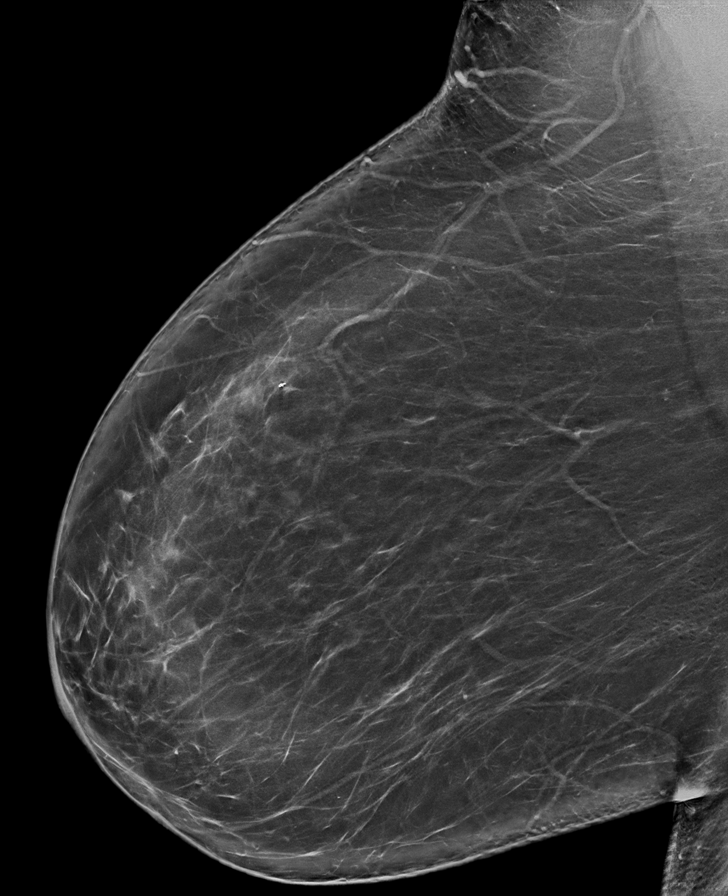

[L MLO synth-2D]
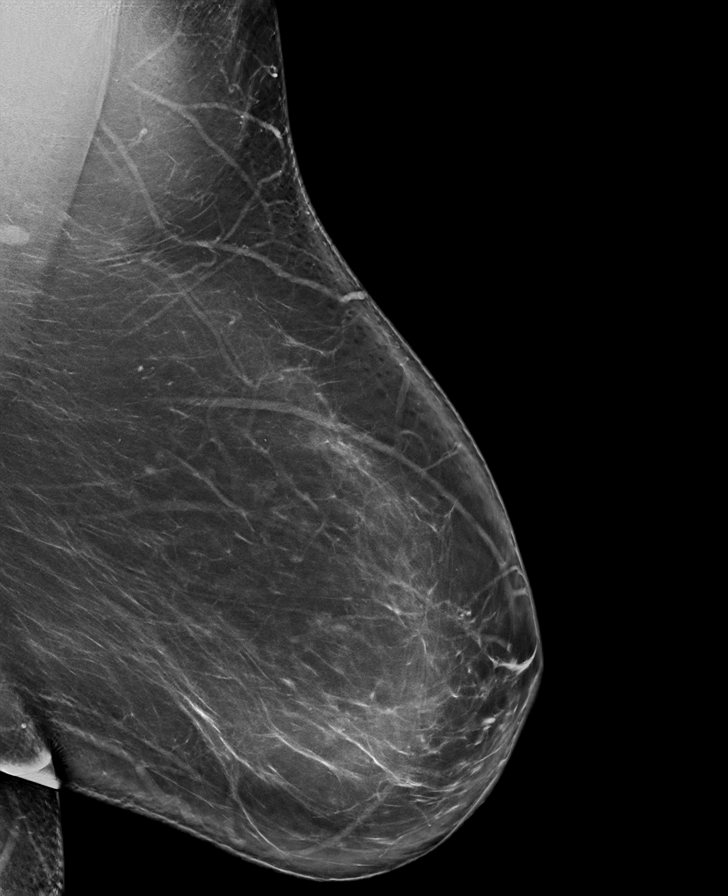

[L CC synth-2D]
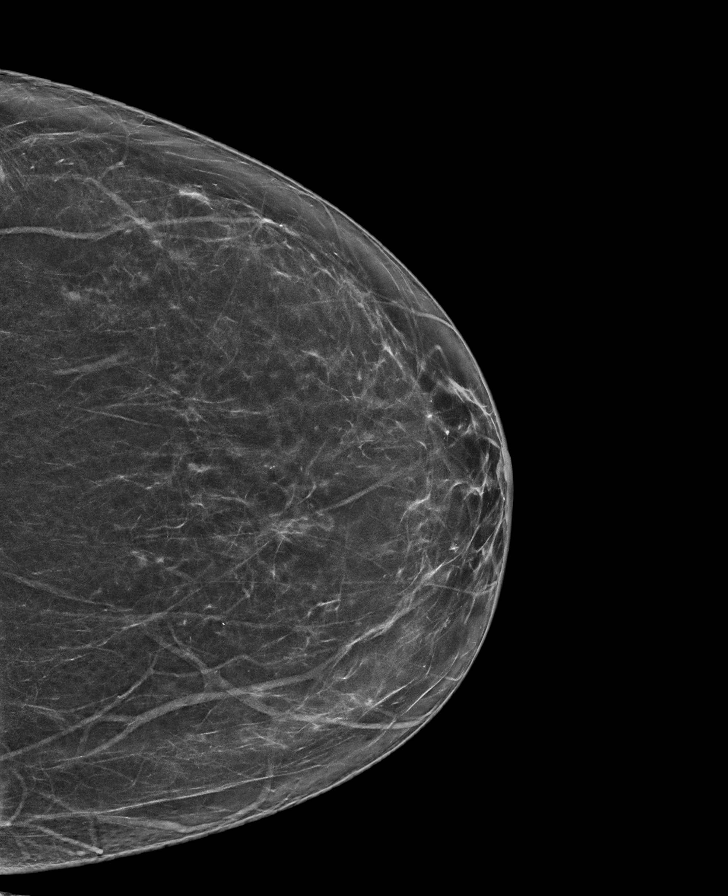

[R CC tomo · tomo slice 43/84.0]
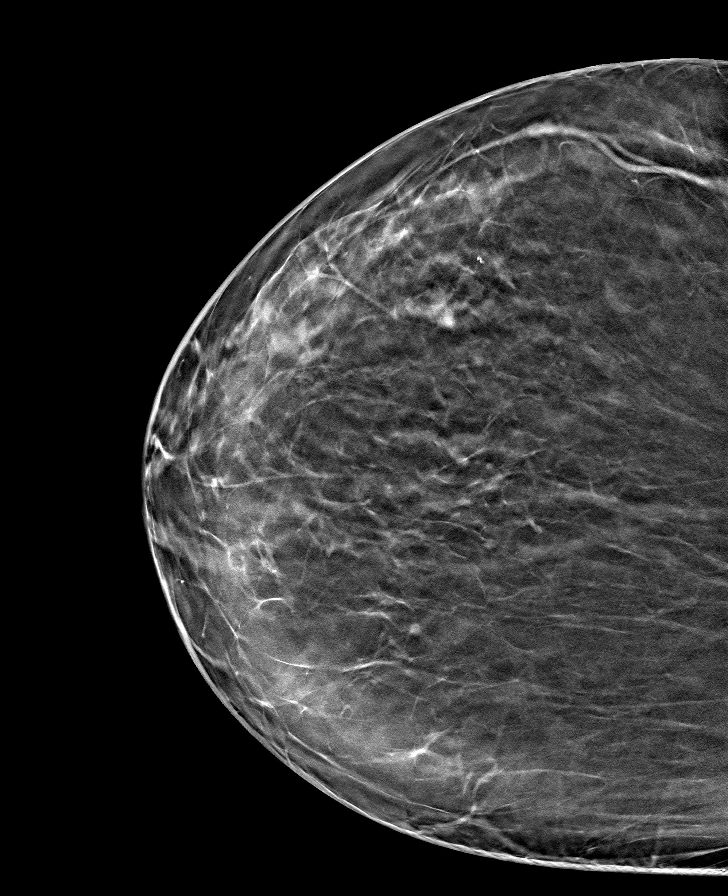

[R MLO tomo · tomo slice 53/106.0]
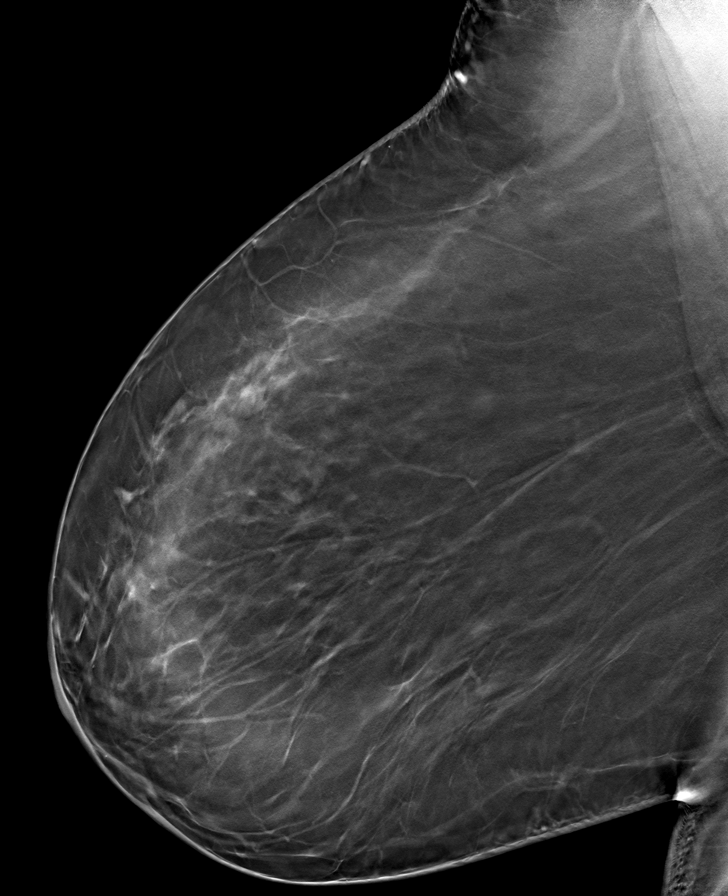

[L CC tomo · tomo slice 41/81.0]
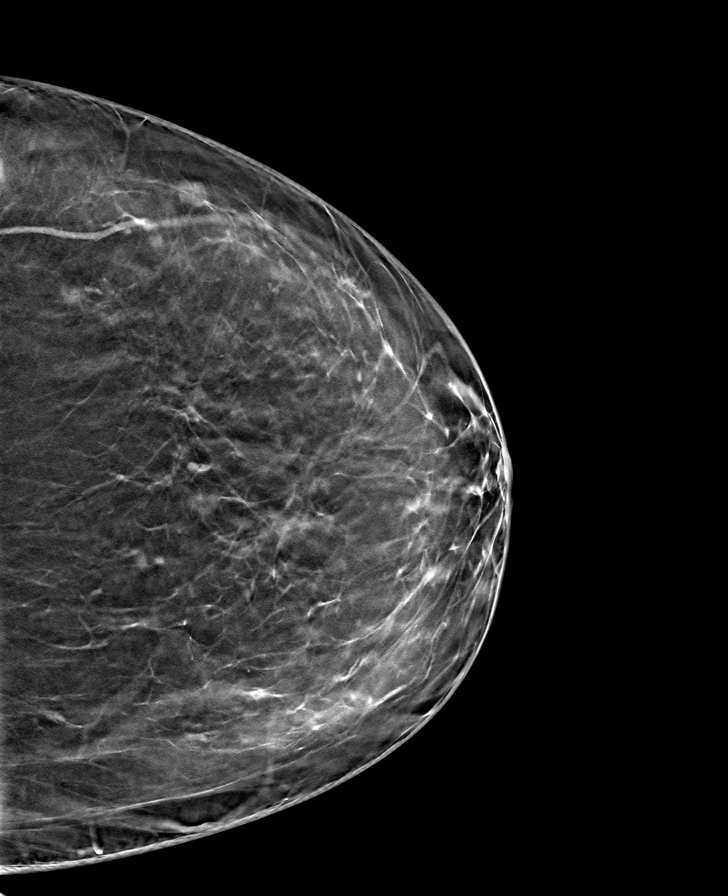

[L MLO tomo · tomo slice 55/109.0]
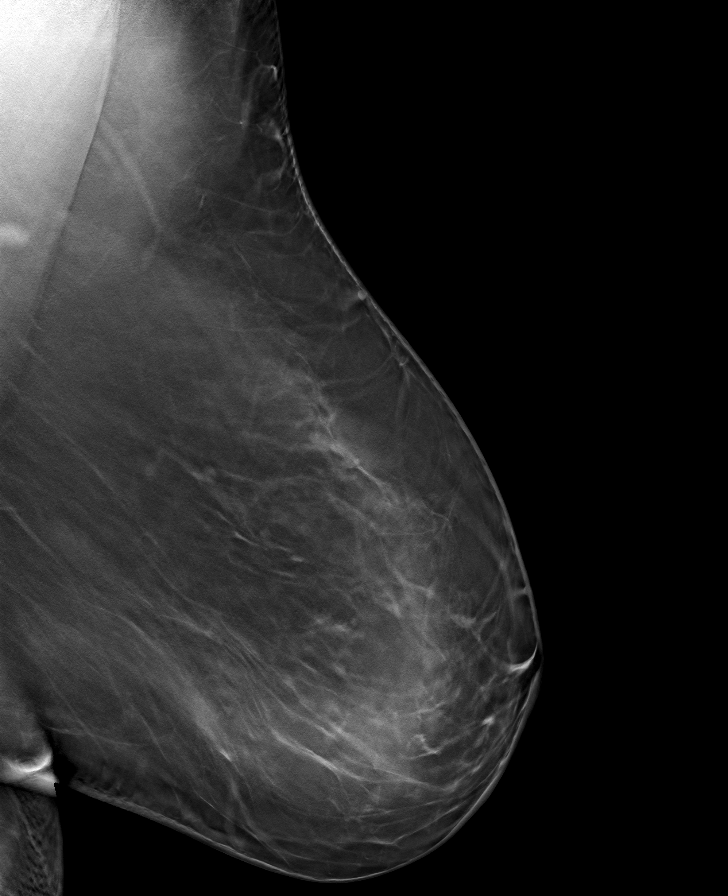

[8 of 24 positions shown; findings below may reference images not displayed]

ACR Breast Density Category b: There are scattered areas of
fibroglandular density.
FINDINGS: In the left breast, a possible asymmetry warrants further
evaluation. In the right breast, no findings suspicious for
malignancy.
IMPRESSION: Further evaluation is suggested for possible asymmetry in the left
breast.

RECOMMENDATION:
Diagnostic mammogram and possibly ultrasound of the left breast.
(Code:[BY])

The patient will be contacted regarding the findings, and additional
imaging will be scheduled.

BI-RADS CATEGORY  0: Incomplete. Need additional imaging evaluation
and/or prior mammograms for comparison.

## 2022-04-14 ENCOUNTER — Ambulatory Visit
Admission: RE | Admit: 2022-04-14 | Discharge: 2022-04-14 | Disposition: A | Discharge status: Home / Self Care | Payer: 59 | Source: Ambulatory Visit | Attending: Neurology | Admitting: Neurology

## 2022-04-14 ENCOUNTER — Other Ambulatory Visit: Payer: Self-pay | Admitting: Family Medicine

## 2022-04-14 DIAGNOSIS — G43109 Migraine with aura, not intractable, without status migrainosus: Secondary | ICD-10-CM

## 2022-04-14 DIAGNOSIS — R202 Paresthesia of skin: Secondary | ICD-10-CM

## 2022-04-14 DIAGNOSIS — G939 Disorder of brain, unspecified: Secondary | ICD-10-CM | POA: Diagnosis not present

## 2022-04-14 DIAGNOSIS — N6489 Other specified disorders of breast: Secondary | ICD-10-CM

## 2022-04-14 DIAGNOSIS — M50223 Other cervical disc displacement at C6-C7 level: Secondary | ICD-10-CM | POA: Diagnosis not present

## 2022-04-14 DIAGNOSIS — M47812 Spondylosis without myelopathy or radiculopathy, cervical region: Secondary | ICD-10-CM | POA: Diagnosis not present

## 2022-04-14 DIAGNOSIS — R928 Other abnormal and inconclusive findings on diagnostic imaging of breast: Secondary | ICD-10-CM

## 2022-04-14 DIAGNOSIS — M4802 Spinal stenosis, cervical region: Secondary | ICD-10-CM | POA: Diagnosis not present

## 2022-04-14 IMAGING — MR MR HEAD WO/W CM
13 series · 48 of 48 positions shown · IV contrast (multihance)
Comparison: Head CT [DATE].

CLINICAL DATA: Provided history: Ocular migraine. Additional
history provided by scanning technologist: Cervicalgia,
paresthesias, bowel and bladder incontinence, ocular migraines.
Patient reports neck, back and leg pain. Numbness on left side of
body. Balance difficulty.

EXAM:
MRI HEAD WITHOUT AND WITH CONTRAST
TECHNIQUE: Multiplanar, multiecho pulse sequences of the brain and surrounding
structures were obtained without and with intravenous contrast.
CONTRAST:  20mL MULTIHANCE GADOBENATE DIMEGLUMINE 529 MG/ML IV SOLN

[Series 6: T1 · sagittal · 4.0mm · 0.75mm/px · 2 of 31 slices shown (1 of 3)]
[im 1/31]
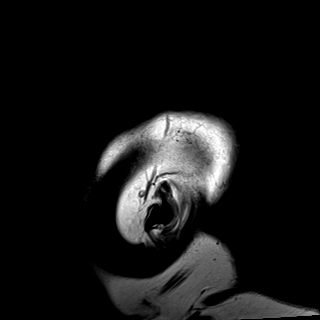
[im 31/31]
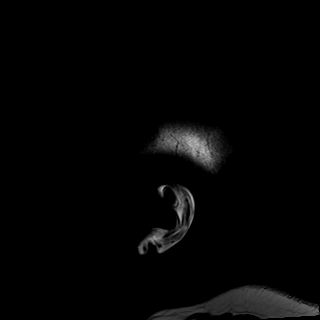

[Series 7: DWI · axial · 3.0mm · 0.94mm/px · z∈[-51,+99]mm · 9 of 176 slices shown (1 of 3)]
[im 1/176]
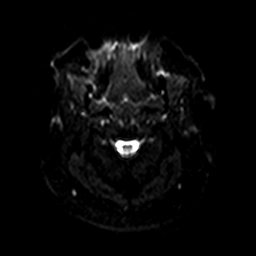
[im 22/176]
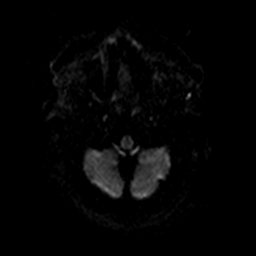
[im 44/176]
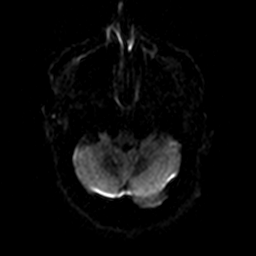
[im 66/176]
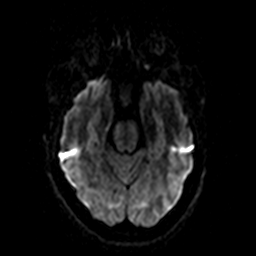
[im 88/176]
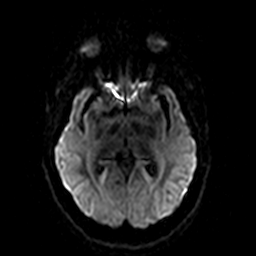
[im 110/176]
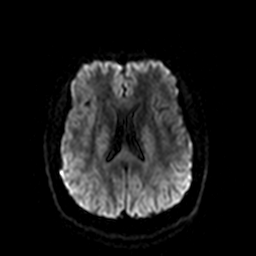
[im 132/176]
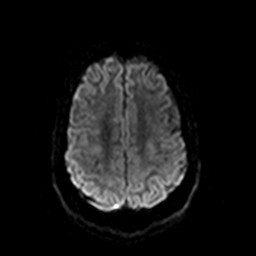
[im 154/176]
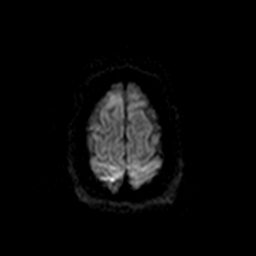
[im 176/176]
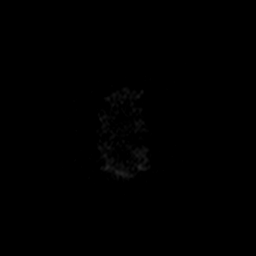

[Series 8: ax dwi_tracew · axial · 3.0mm · 0.94mm/px · z∈[-51,+99]mm · 4 of 88 slices shown]
[im 1/88]
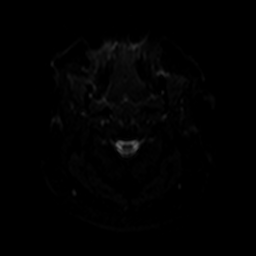
[im 30/88]
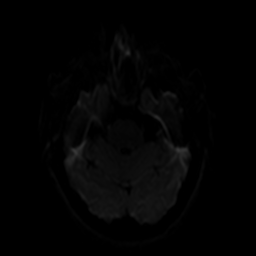
[im 59/88]
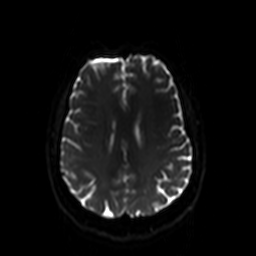
[im 88/88]
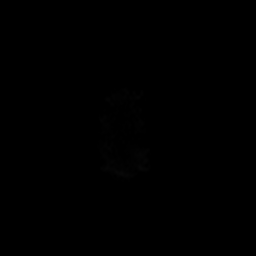

[Series 9: ax dwi_adc · axial · 3.0mm · 0.94mm/px · z∈[-51,+99]mm · 2 of 43 slices shown]
[im 1/43]
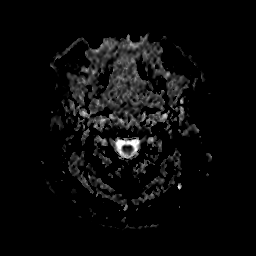
[im 43/43]
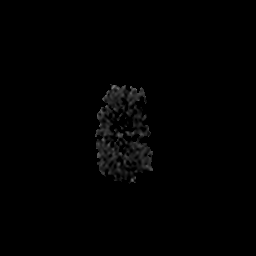

[Series 10: DWI · coronal · 5.0mm · 1.44mm/px · 3 of 68 slices shown (2 of 3)]
[im 1/68]
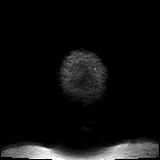
[im 34/68]
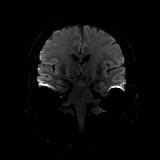
[im 68/68]
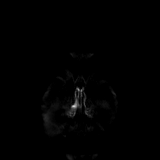

[Series 11: DWI · coronal · 5.0mm · 1.44mm/px · 2 of 34 slices shown (3 of 3)]
[im 1/34]
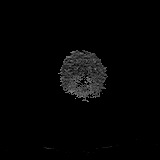
[im 34/34]
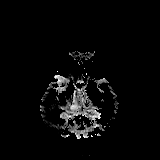

[Series 12: FLAIR · axial · 3.0mm · 0.72mm/px · 1 of 26 slices shown]
[im 1/26]
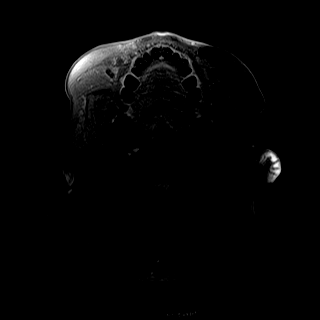

[Series 13: T2 · axial · 4.0mm · 0.36mm/px · z∈[-49,+99]mm · 2 of 31 slices shown]
[im 1/31]
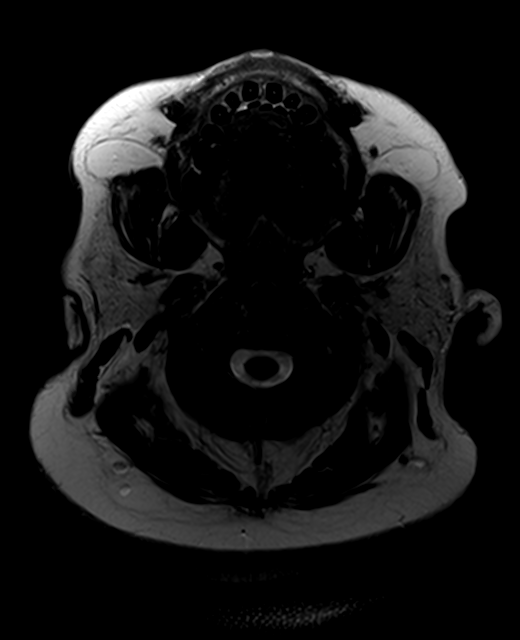
[im 31/31]
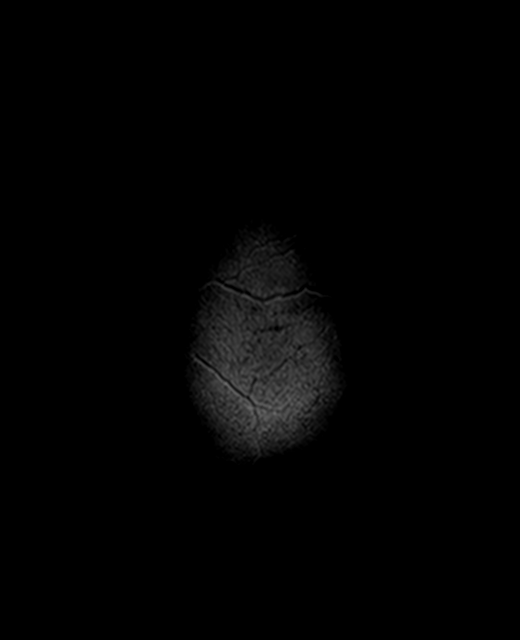

[Series 15: swi_images · axial · 3.0mm · 0.90mm/px · z∈[-64,+115]mm · 3 of 64 slices shown]
[im 1/64]
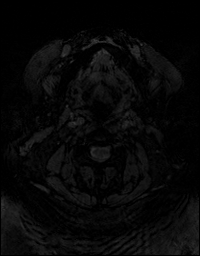
[im 32/64]
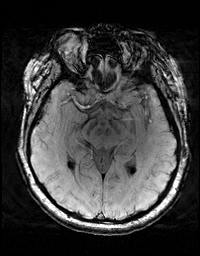
[im 64/64]
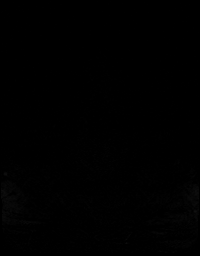

[Series 16: T1 · axial · 1.0mm · 0.90mm/px · z∈[-59,+95]mm · 8 of 160 slices shown (2 of 3)]
[im 1/160]
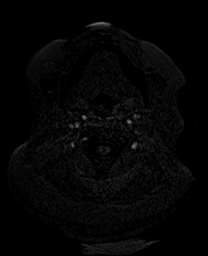
[im 23/160]
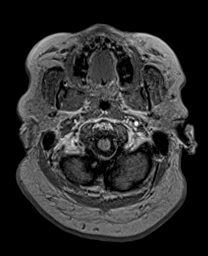
[im 46/160]
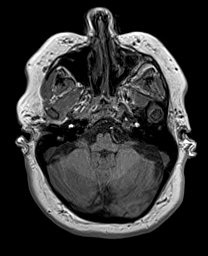
[im 69/160]
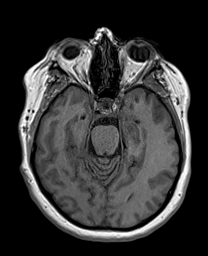
[im 91/160]
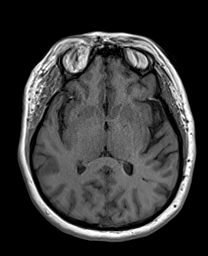
[im 114/160]
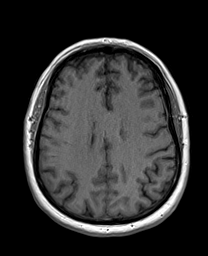
[im 137/160]
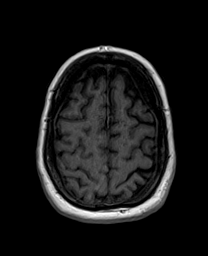
[im 160/160]
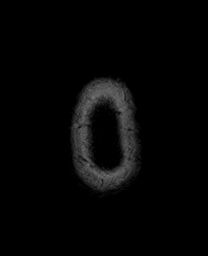

[Series 22: T2 post-contrast · coronal · 4.5mm · 0.36mm/px · 2 of 32 slices shown]
[im 1/32]
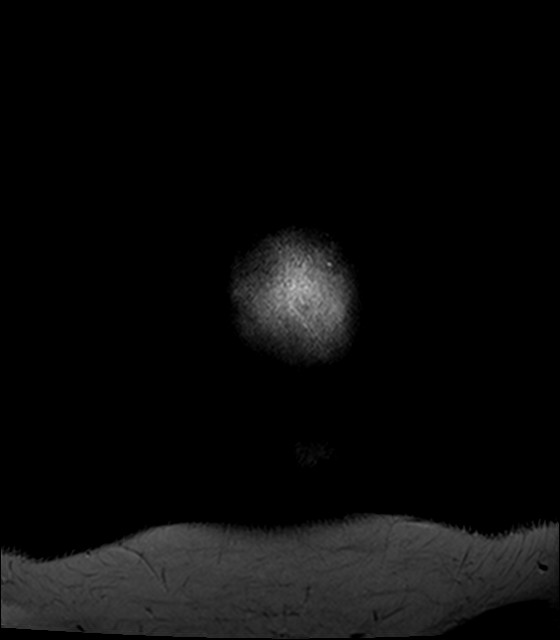
[im 32/32]
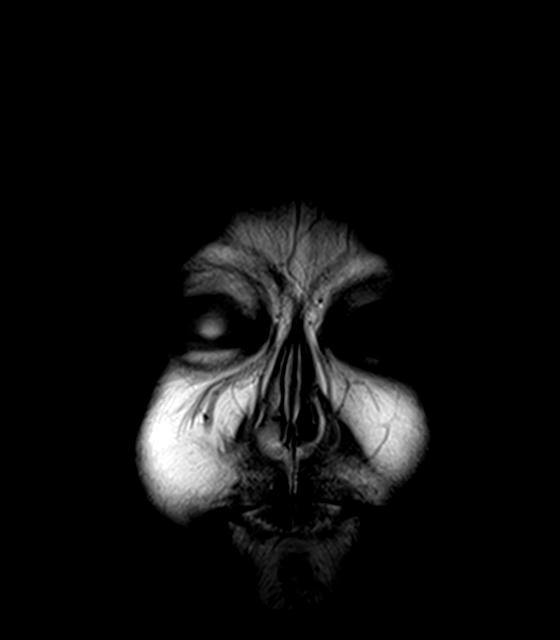

[Series 24: T1 post-contrast · coronal · 4.5mm · 0.72mm/px · 2 of 32 slices shown]
[im 1/32]
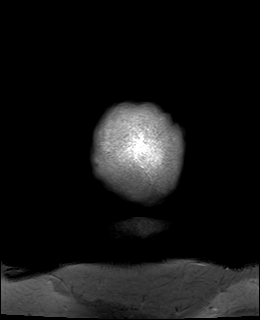
[im 32/32]
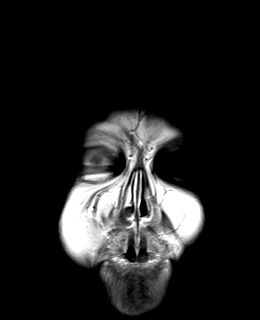

[Series 25: T1 · axial · 1.0mm · 0.94mm/px · z∈[-60,+95]mm · 8 of 160 slices shown (3 of 3)]
[im 1/160]
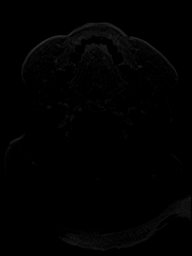
[im 23/160]
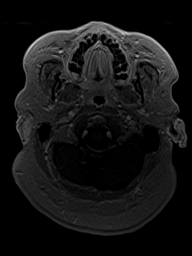
[im 46/160]
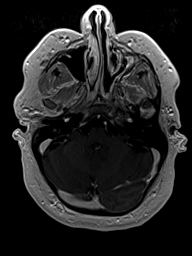
[im 69/160]
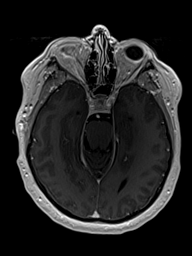
[im 91/160]
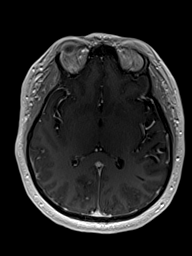
[im 114/160]
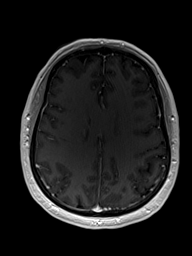
[im 137/160]
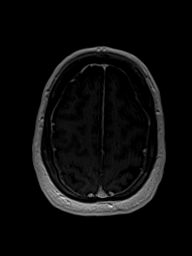
[im 160/160]
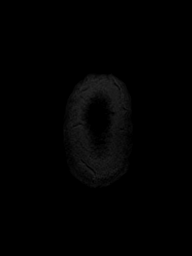

[48 of 48 positions shown; findings below may reference images not displayed]

FINDINGS: Brain:

Cerebral volume is normal.

Prominent perivascular space versus chronic lacunar infarct within
the left caudate nucleus.

There are a few small scattered foci of T2 FLAIR hyperintense signal
abnormality within the cerebral white matter (measuring up to 3 mm).

Small chronic infarct within the medial right cerebellar hemisphere.

No cortical encephalomalacia is identified.

There is no acute infarct.

No evidence of an intracranial mass.

No chronic intracranial blood products.

No extra-axial fluid collection.

No midline shift.

No pathologic intracranial enhancement identified.

Vascular: Maintained flow voids within the proximal large arterial
vessels.

Skull and upper cervical spine: No focal suspicious marrow lesion.

Sinuses/Orbits: No mass or acute finding within the imaged orbits.
Mild mucosal thickening within the bilateral maxillary sinuses.
Associated left maxillary sinus chronic reactive osteitis. 16 mm
mucous retention cyst, and background moderate mucosal thickening,
within the left sphenoid sinus. Trace mucosal thickening within the
right sphenoid sinus and bilateral ethmoid sinuses.

Other: Small-volume fluid within the bilateral mastoid air cells.
Nonspecific 11 mm T2 hyperintense and hypoenhancing or nonenhancing
lesion at the anterior aspect of the right nasal passage.
IMPRESSION: 1. No evidence of acute intracranial abnormality.
2. There are a few small scattered foci of T2 FLAIR hyperintense
signal abnormality within the cerebral white matter (measuring up to
3 mm). These foci are nonspecific, but most often secondary to
chronic small vessel ischemia.
3. Prominent perivascular space versus chronic lacunar infarct
within the left caudate nucleus.
4. Small chronic infarct within the medial right cerebellar
hemisphere.
5. Paranasal sinus disease, as described.
6. Nonspecific 11 mm T2 hyperintense and hypoenhancing or
nonenhancing lesion at the anterior aspect of the right nasal
passage. Direct visualization recommended.
7. Small-volume fluid within the bilateral mastoid air cells.

## 2022-04-14 IMAGING — MR MR CERVICAL SPINE WO/W CM
6 of 9 series · 27 of 48 positions shown · IV contrast (multihance)
Comparison: Same-day brain MRI [DATE]. Radiographs of the
cervical spine [DATE].

CLINICAL DATA: Provided history: Ocular migraine. Paresthesia.
Additional history provided: Cervicalgia, paresthesia, bowel/bladder
incontinence. Patient reports neck, back and leg pain. Numbness on
left side of body. Balance difficulty.

EXAM:
MRI CERVICAL SPINE WITHOUT AND WITH CONTRAST
TECHNIQUE: Multiplanar and multiecho pulse sequences of the cervical spine, to
include the craniocervical junction and cervicothoracic junction,
were obtained without and with intravenous contrast.
CONTRAST:  20mL MULTIHANCE GADOBENATE DIMEGLUMINE 529 MG/ML IV SOLN

[Series 10: T1 · sagittal · 3.0mm · 0.66mm/px · 4 of 15 slices shown (1 of 3)]
[im 1/15]
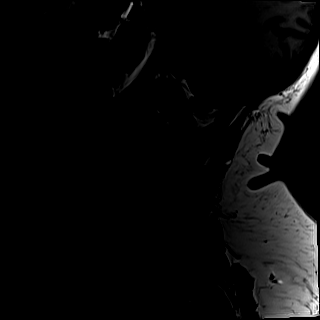
[im 5/15]
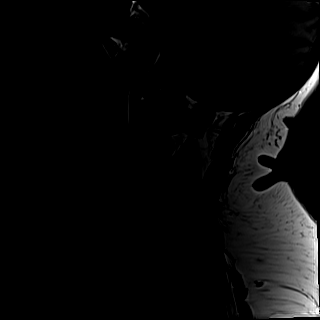
[im 10/15]
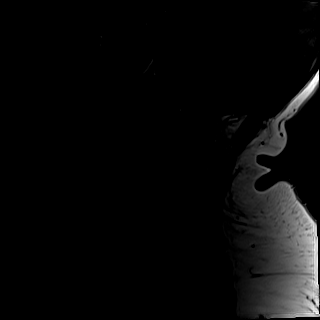
[im 15/15]
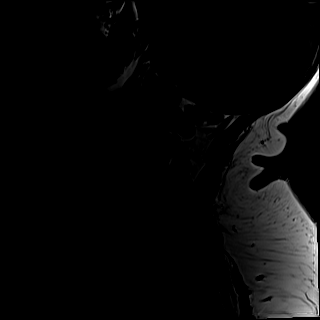

[Series 11: T2 · sagittal · 3.0mm · 0.55mm/px · 3 of 15 slices shown (1 of 2)]
[im 1/15]
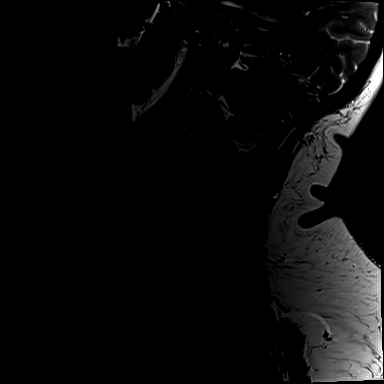
[im 8/15]
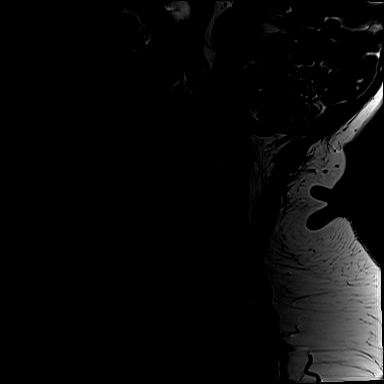
[im 15/15]
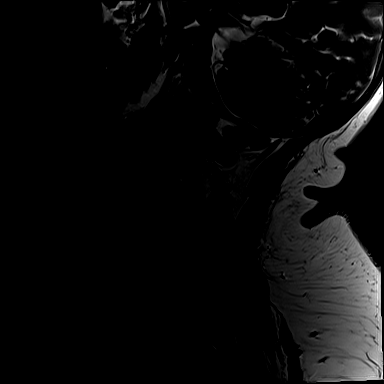

[Series 13: T2 · axial · 3.0mm · 0.50mm/px · z∈[-214,-125]mm · 7 of 30 slices shown (2 of 2)]
[im 1/30]
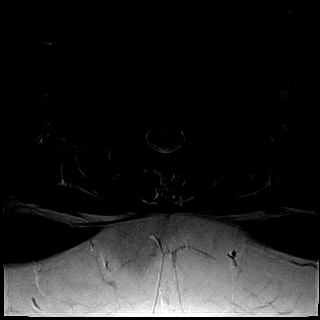
[im 5/30]
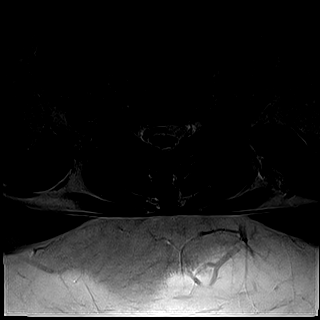
[im 10/30]
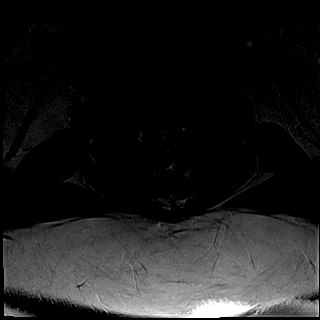
[im 15/30]
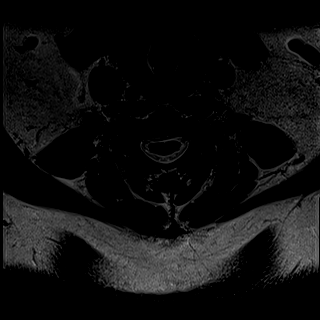
[im 20/30]
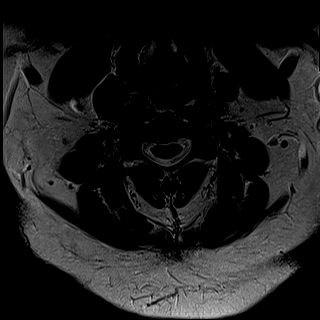
[im 25/30]
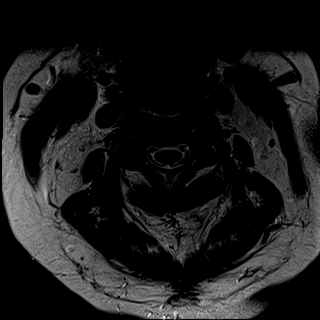
[im 30/30]
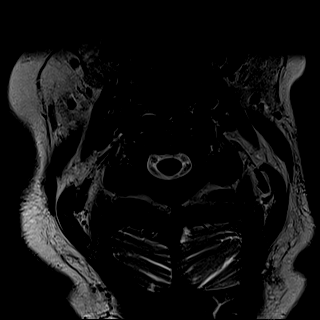

[Series 15: T1 · axial · 3.0mm · 0.31mm/px · z∈[-214,-125]mm · 7 of 30 slices shown (2 of 3)]
[im 1/30]
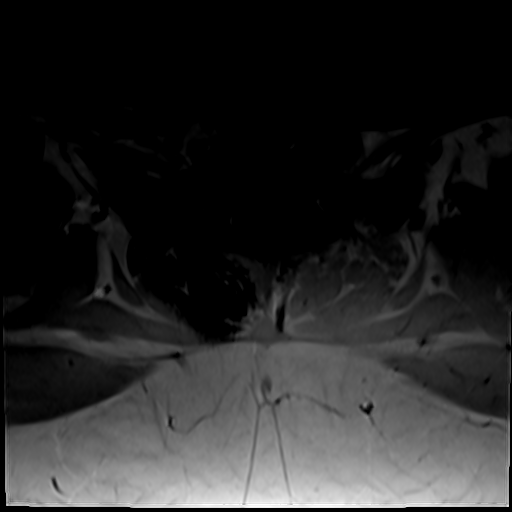
[im 5/30]
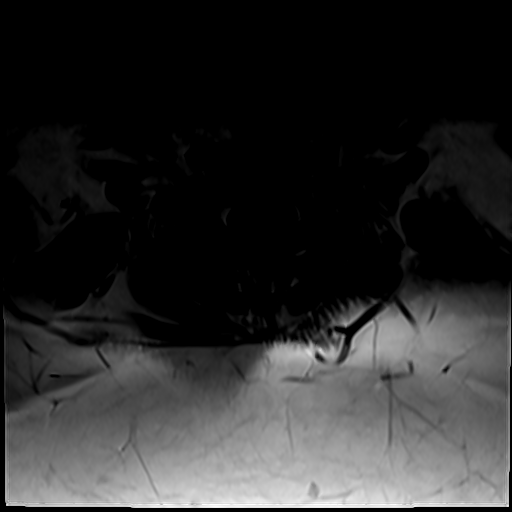
[im 10/30]
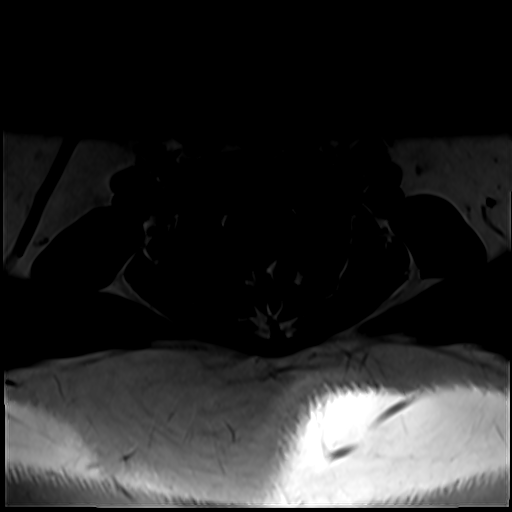
[im 15/30]
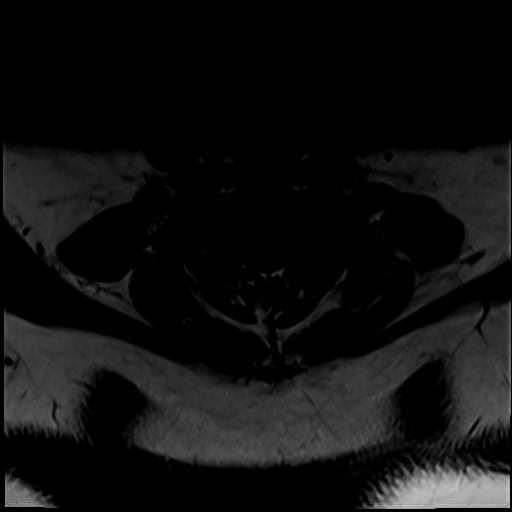
[im 20/30]
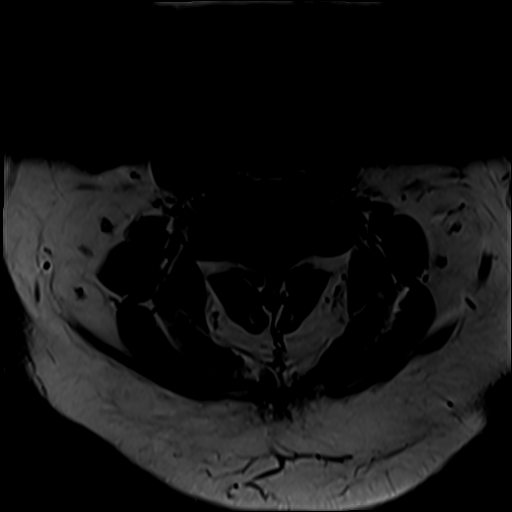
[im 25/30]
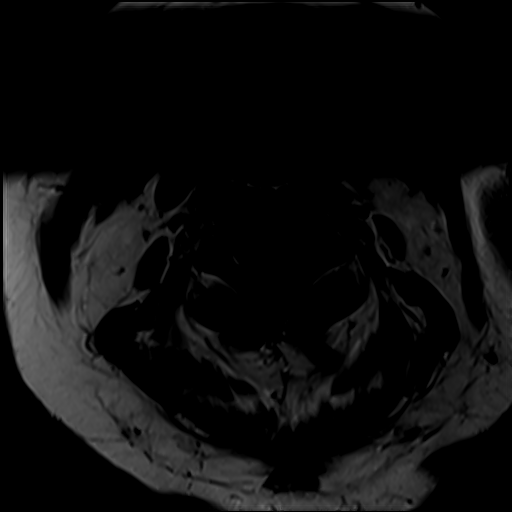
[im 30/30]
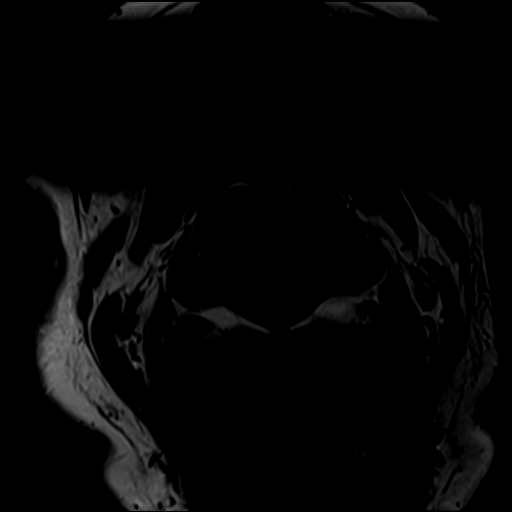

[Series 16: T1 fat-sat post-contrast · sagittal · 3.0mm · 0.66mm/px · 3 of 15 slices shown]
[im 1/15]
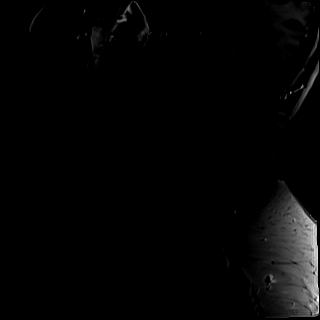
[im 8/15]
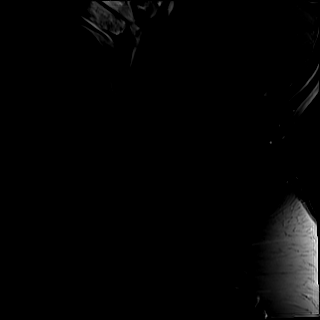
[im 15/15]
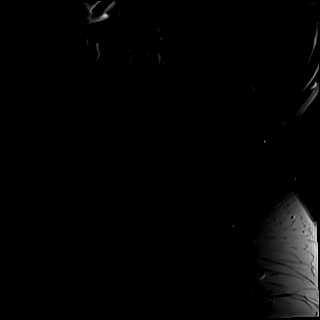

[Series 17: T1 · axial · 3.0mm · 0.31mm/px · z∈[-214,-187]mm · 3 of 30 slices shown (3 of 3)]
[im 1/30]
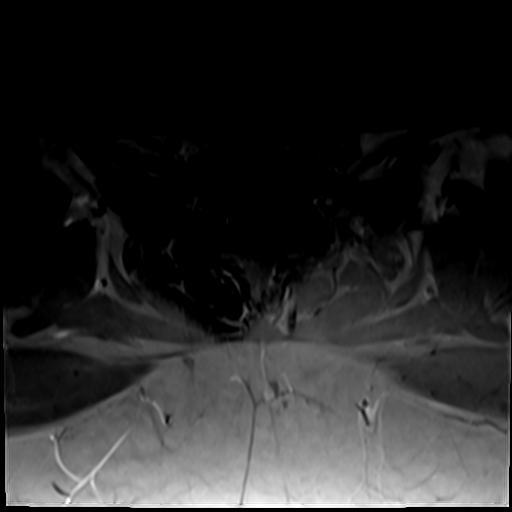
[im 5/30]
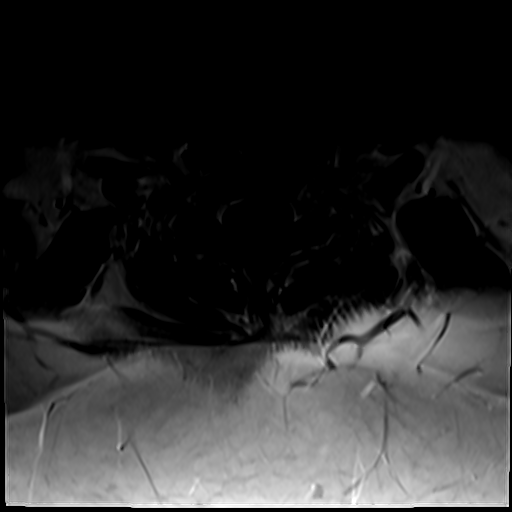
[im 10/30]
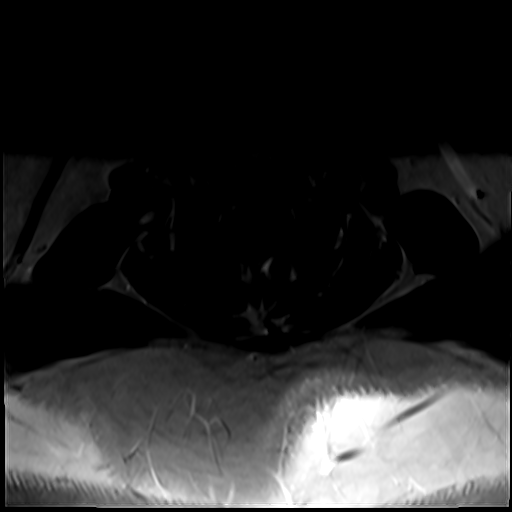

[27 of 48 positions shown; findings below may reference images not displayed]

FINDINGS: Alignment: Reversal of the expected cervical lordosis. No
significant spondylolisthesis.

Vertebrae: Vertebral body height is maintained. No significant
marrow edema or focal suspicious osseous lesion. Ventral osteophytes
at C4-C5 and C5-C6.

Cord: No signal abnormality identified within the cervical spinal
cord. No abnormal spinal cord enhancement.

Posterior Fossa, vertebral arteries, paraspinal tissues: Posterior
fossa better assessed on same-day brain MRI. Flow voids preserved
within the imaged cervical vertebral arteries. No paraspinal mass or
collection.

Disc levels:

Multilevel disc degeneration, greatest at C4-C5 (mild-to-moderate)
and C5-C6 (moderate).

C2-C3: No significant disc herniation or stenosis.

C3-C4: No significant disc herniation or stenosis.

C4-C5: Disc bulge asymmetric to the right. Mild uncovertebral
hypertrophy on the right. Mild partial effacement of the ventral
thecal sac with contact upon the ventral spinal cord. No significant
foraminal stenosis. Perineural cyst within the left neural foramen.

C5-C6: Disc bulge with bilateral uncovertebral hypertrophy. The disc
bulge and disc protrusion mildly efface the ventral thecal sac,
contacting the ventral aspect of the spinal cord. Mild relative
right neural foraminal narrowing.

C6-C7: Disc bulge. Mild uncovertebral hypertrophy on the right. Mild
partial effacement of the ventral thecal sac (without spinal cord
mass effect). No significant foraminal stenosis.

C7-T1: No significant disc herniation or stenosis.
IMPRESSION: Cervical spondylosis, as outlined. No more than mild relative spinal
canal narrowing. At C5-C6, uncovertebral hypertrophy contributes to
mild relative right neural foraminal narrowing. Disc degeneration is
greatest at C4-C5 (mild-to-moderate) and C5-C6 (moderate).

Nonspecific reversal of the expected cervical doses.

No signal no signal abnormality or pathologic enhancement within the
cervical spinal cord.

## 2022-04-14 MED ORDER — GADOBENATE DIMEGLUMINE 529 MG/ML IV SOLN
20.0000 mL | Freq: Once | INTRAVENOUS | Status: AC | PRN
  Administered 2022-04-14: 20 mL via INTRAVENOUS

## 2022-04-20 ENCOUNTER — Other Ambulatory Visit: Payer: Self-pay | Admitting: *Deleted

## 2022-04-20 ENCOUNTER — Inpatient Hospital Stay
Admission: RE | Admit: 2022-04-20 | Discharge: 2022-04-20 | Disposition: A | Discharge status: Home / Self Care | Payer: Self-pay | Source: Ambulatory Visit | Attending: *Deleted | Admitting: *Deleted

## 2022-04-20 ENCOUNTER — Encounter: Payer: Self-pay | Admitting: Family Medicine

## 2022-04-20 DIAGNOSIS — Z1231 Encounter for screening mammogram for malignant neoplasm of breast: Secondary | ICD-10-CM

## 2022-04-25 ENCOUNTER — Ambulatory Visit
Admission: RE | Admit: 2022-04-25 | Discharge: 2022-04-25 | Disposition: A | Discharge status: Home / Self Care | Payer: 59 | Source: Ambulatory Visit | Attending: Family Medicine | Admitting: Family Medicine

## 2022-04-25 DIAGNOSIS — N6489 Other specified disorders of breast: Secondary | ICD-10-CM | POA: Diagnosis not present

## 2022-04-25 DIAGNOSIS — R928 Other abnormal and inconclusive findings on diagnostic imaging of breast: Secondary | ICD-10-CM | POA: Diagnosis not present

## 2022-04-25 DIAGNOSIS — N6323 Unspecified lump in the left breast, lower outer quadrant: Secondary | ICD-10-CM | POA: Diagnosis not present

## 2022-04-25 IMAGING — US US BREAST*L* LIMITED INC AXILLA
1 series · 6 of 6 positions shown · non-contrast
Comparison: Previous exam(s).

CLINICAL DATA: Patient returns after screening study for evaluation
of possible LEFT breast asymmetry.

EXAM:
DIGITAL DIAGNOSTIC UNILATERAL LEFT MAMMOGRAM WITH TOMOSYNTHESIS AND
CAD; ULTRASOUND LEFT BREAST LIMITED
TECHNIQUE: Left digital diagnostic mammography and breast tomosynthesis was
performed. The images were evaluated with computer-aided detection.;
Targeted ultrasound examination of the left breast was performed.

[Series 1: us breast*left* limited inc axilla · 0.06mm/px · 6 of 6 slices shown]
[im 1/6]
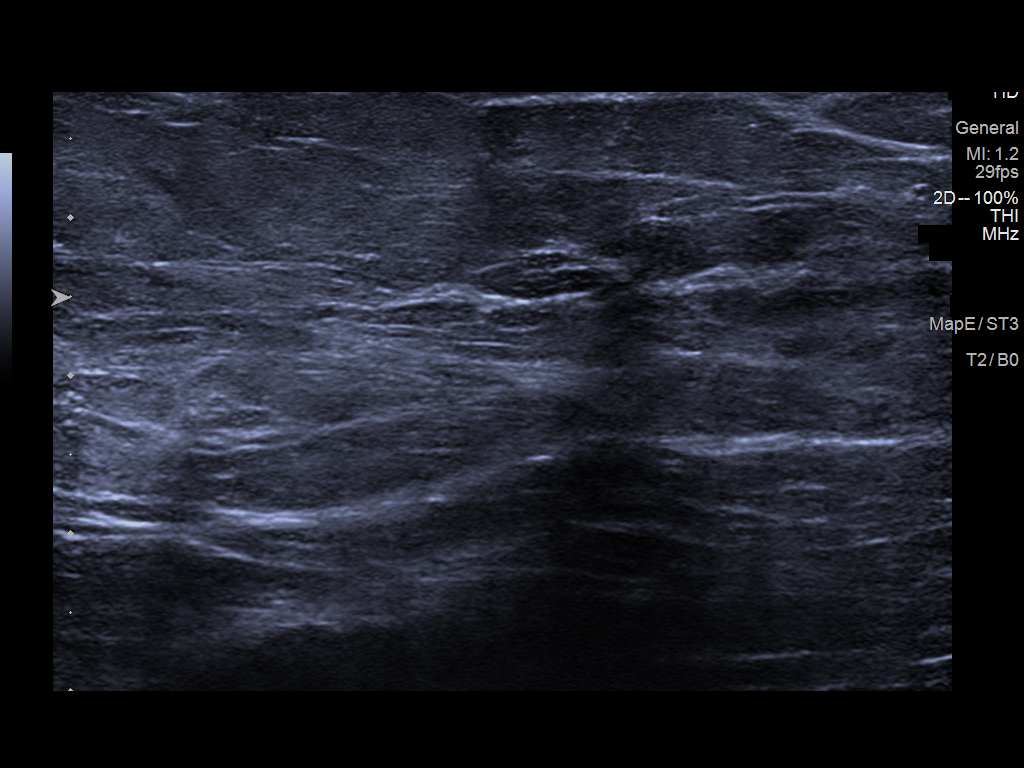
[im 2/6]
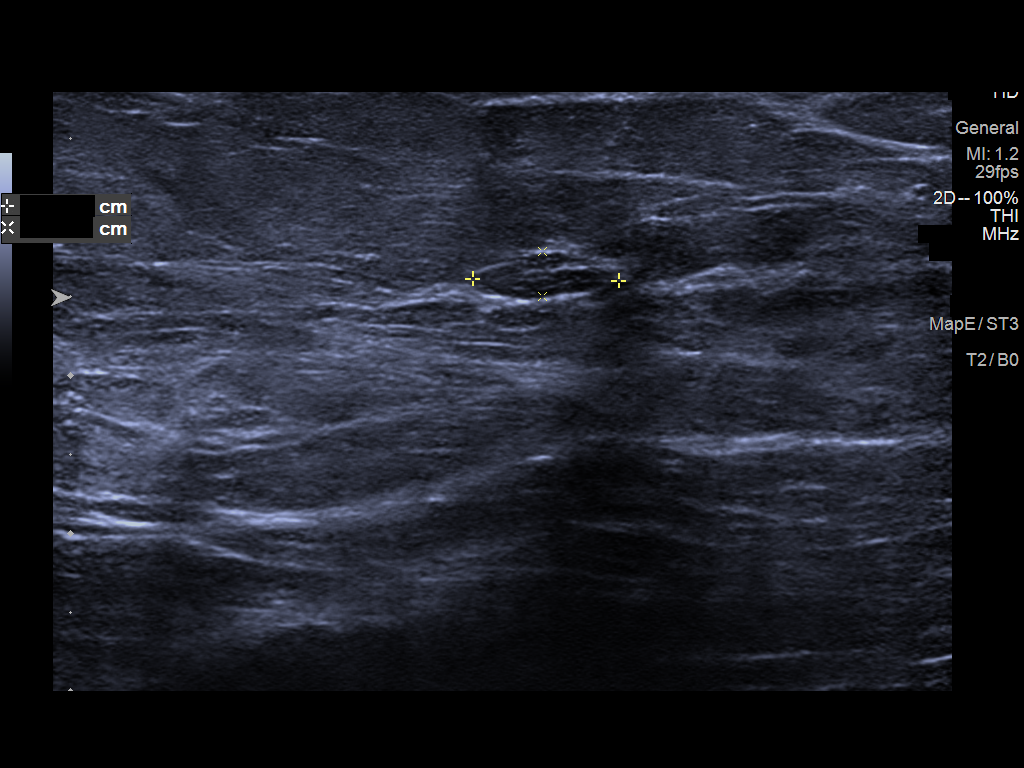
[im 3/6]
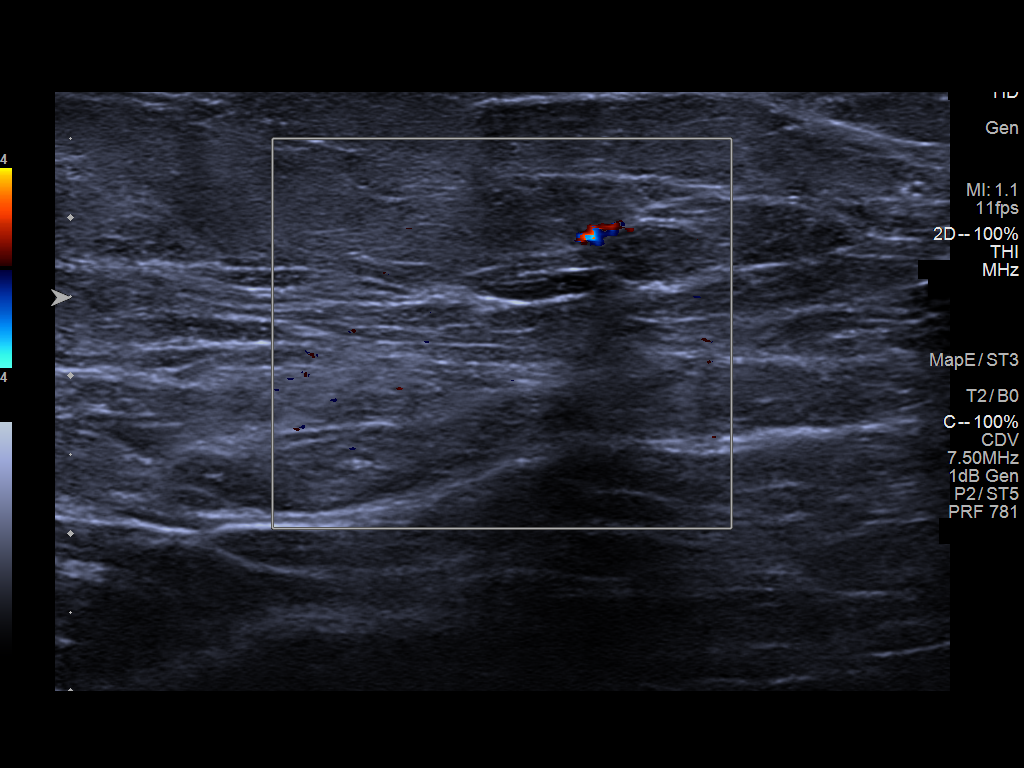
[im 4/6]
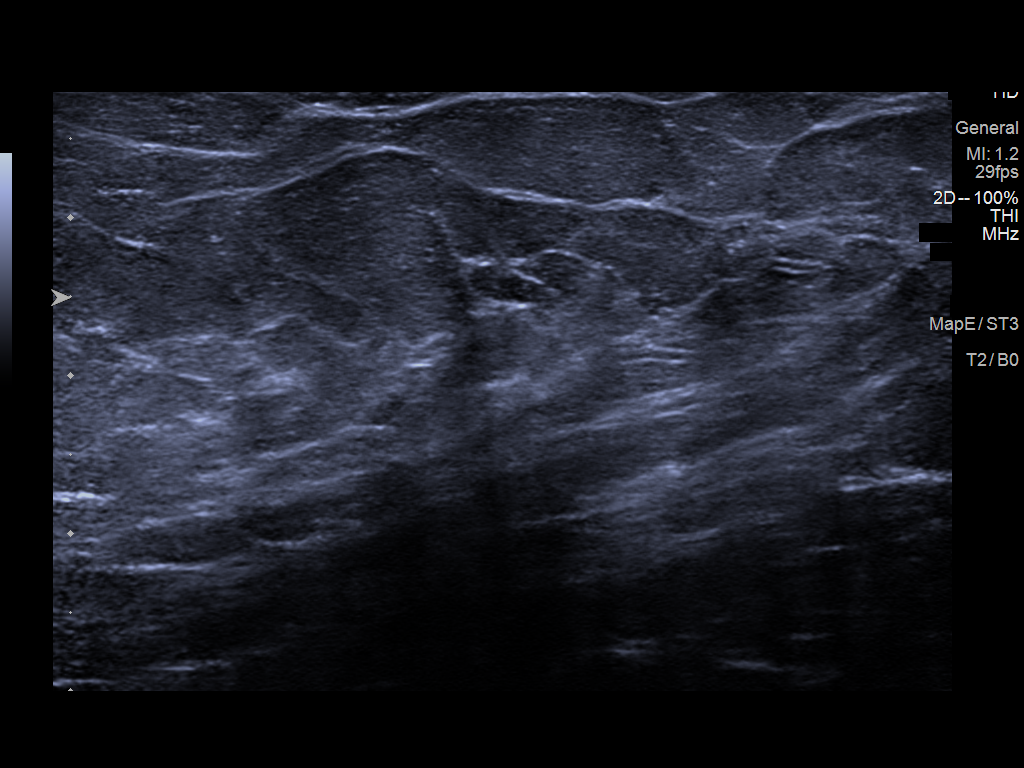
[im 5/6]
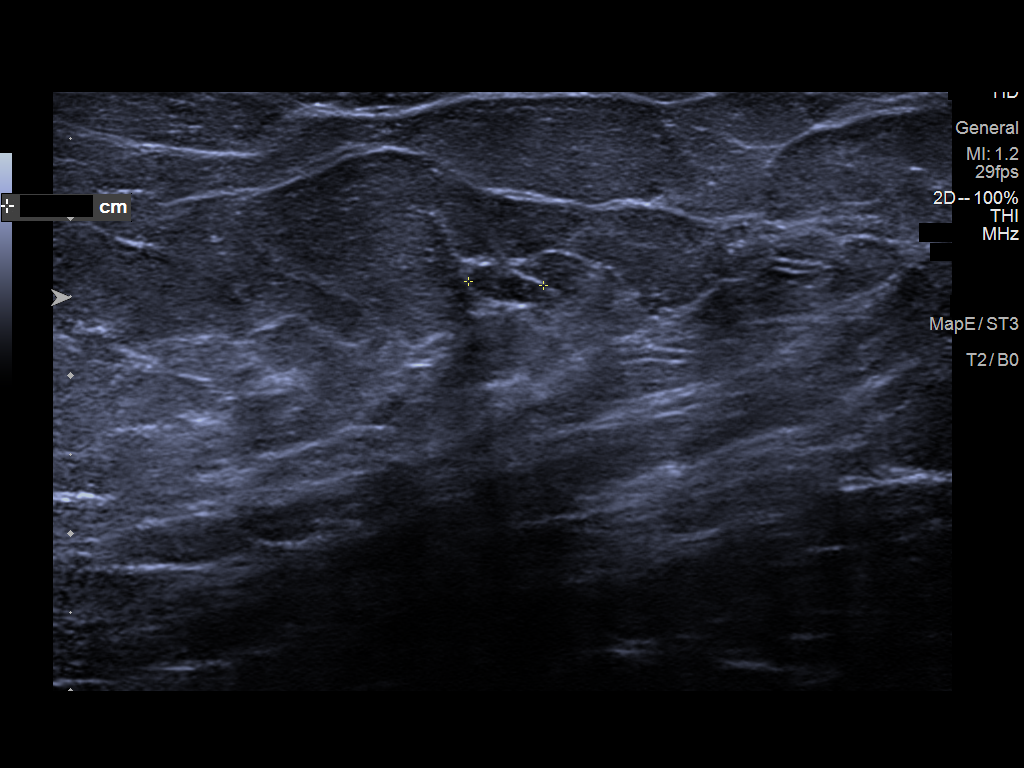
[im 6/6]
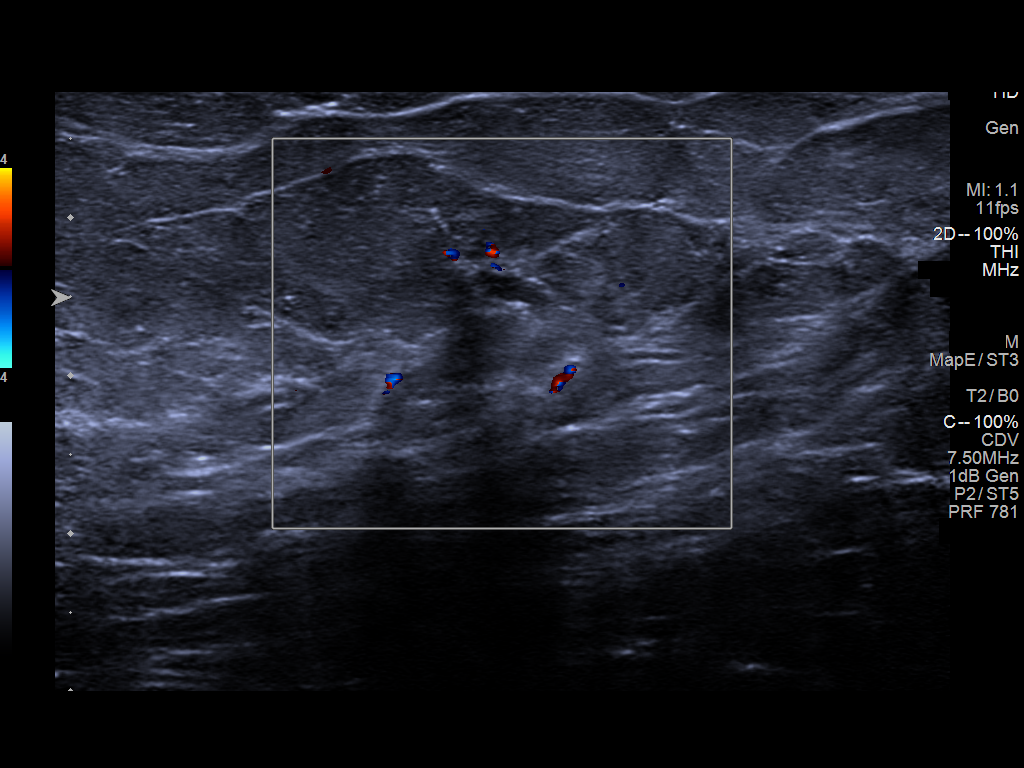

[6 of 6 positions shown; findings below may reference images not displayed]

ACR Breast Density Category b: There are scattered areas of
fibroglandular density.
FINDINGS: Additional 2-D and 3-D images are performed. These views confirm
presence of a low-attenuation partially obscured mass in the LOWER
OUTER QUADRANT of the LEFT breast.

Targeted ultrasound is performed, showing a circumscribed oval
parallel hypoechoic mass in the 4 o'clock location of the LEFT
breast 8 centimeters from nipple which measures 0.9 x 0.3 x
centimeters. There is central increased echogenicity. Findings raise
a question of a benign intramammary lymph node, possibly correlating
with the mammographic abnormality. No other suspicious abnormalities
identified in the LATERAL portion of the LEFT breast.
IMPRESSION: Benign appearing partially obscured oval mass in the LATERAL portion
of the LEFT breast.

Benign appearing possible intramammary lymph node in the 4 o'clock
location of the LEFT breast, a possible sonographic correlate for
the mammographic abnormality.

RECOMMENDATION:
Recommend LEFT breast sound and LEFT diagnostic mammogram in 6
months to assess stability of these findings.

I have discussed the findings and recommendations with the patient.
If applicable, a reminder letter will be sent to the patient
regarding the next appointment.

BI-RADS CATEGORY  3: Probably benign.

## 2022-05-23 ENCOUNTER — Ambulatory Visit: Payer: Self-pay | Admitting: Family Medicine

## 2022-05-24 DIAGNOSIS — G4733 Obstructive sleep apnea (adult) (pediatric): Secondary | ICD-10-CM | POA: Diagnosis not present

## 2022-05-30 DIAGNOSIS — R440 Auditory hallucinations: Secondary | ICD-10-CM | POA: Diagnosis not present

## 2022-05-30 DIAGNOSIS — E559 Vitamin D deficiency, unspecified: Secondary | ICD-10-CM | POA: Diagnosis not present

## 2022-05-30 DIAGNOSIS — R159 Full incontinence of feces: Secondary | ICD-10-CM | POA: Diagnosis not present

## 2022-05-30 DIAGNOSIS — R32 Unspecified urinary incontinence: Secondary | ICD-10-CM | POA: Diagnosis not present

## 2022-05-30 DIAGNOSIS — R69 Illness, unspecified: Secondary | ICD-10-CM | POA: Diagnosis not present

## 2022-05-30 DIAGNOSIS — R202 Paresthesia of skin: Secondary | ICD-10-CM | POA: Diagnosis not present

## 2022-05-30 DIAGNOSIS — Z8673 Personal history of transient ischemic attack (TIA), and cerebral infarction without residual deficits: Secondary | ICD-10-CM | POA: Diagnosis not present

## 2022-05-30 DIAGNOSIS — J3489 Other specified disorders of nose and nasal sinuses: Secondary | ICD-10-CM | POA: Diagnosis not present

## 2022-06-07 ENCOUNTER — Encounter: Payer: Self-pay | Admitting: Family Medicine

## 2022-06-08 NOTE — Telephone Encounter (Signed)
Thank you for your message.  This problem warrants an office visit to discuss and treat appropriately with your provider.  Please give our office a call at [phone] to schedule an appointment.  Take care, [facility] staff

## 2022-06-14 ENCOUNTER — Encounter: Payer: Self-pay | Admitting: Family Medicine

## 2022-06-14 ENCOUNTER — Ambulatory Visit: Payer: 59 | Admitting: Family Medicine

## 2022-06-14 VITALS — BP 118/81 | HR 71 | Temp 97.9°F | Resp 16 | Wt 284.9 lb

## 2022-06-14 DIAGNOSIS — F411 Generalized anxiety disorder: Secondary | ICD-10-CM | POA: Diagnosis not present

## 2022-06-14 DIAGNOSIS — R69 Illness, unspecified: Secondary | ICD-10-CM | POA: Diagnosis not present

## 2022-06-14 DIAGNOSIS — G629 Polyneuropathy, unspecified: Secondary | ICD-10-CM | POA: Diagnosis not present

## 2022-06-14 DIAGNOSIS — K8681 Exocrine pancreatic insufficiency: Secondary | ICD-10-CM | POA: Diagnosis not present

## 2022-06-14 DIAGNOSIS — Z0289 Encounter for other administrative examinations: Secondary | ICD-10-CM | POA: Diagnosis not present

## 2022-06-14 DIAGNOSIS — F331 Major depressive disorder, recurrent, moderate: Secondary | ICD-10-CM

## 2022-06-14 DIAGNOSIS — R159 Full incontinence of feces: Secondary | ICD-10-CM

## 2022-06-14 DIAGNOSIS — R152 Fecal urgency: Secondary | ICD-10-CM | POA: Diagnosis not present

## 2022-06-14 NOTE — Progress Notes (Signed)
   SUBJECTIVE:   CHIEF COMPLAINT / HPI:   Disability paperwork - needs paperwork filled out to continue to receive disability benefits. 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 - since hospitalization has had constellation of symptoms including fecal/urinary incontinence, mental slowing, sleep paralysis, tremors, neuropathy that have made it difficult to perform her daily work duties. 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 - currently followed by GI and neuro for symptoms and workup - finds it difficult to work onsite due to physical responsibilities of duties (lifting pets/food, cleaning, physically assisting vet if needed). - able to continue current clerical role as it allows her frequent bathroom and rest breaks.    OBJECTIVE:   BP 118/81 (BP Location: Left Arm, Patient Position: Sitting, Cuff Size: Large)   Pulse 71   Temp 97.9 F (36.6 C) (Oral)   Resp 16   Wt 284 lb 14.4 oz (129.2 kg)   BMI 52.11 kg/m   Gen: well appearing, in NAD Card: Reg rate Lungs: Comfortable WOB on RA Ext: WWP   ASSESSMENT/PLAN:   Encounter for form completion Reviewed extensive history of symptoms, current workup and findings thus far. Completed STD paperwork based on current information from PCP and involved specialists. F/u as scheduled.   Problem List Items Addressed This Visit       Digestive   Exocrine pancreatic insufficiency     Nervous and Auditory   Neuropathy     Other   Anxiety disorder   Incontinence of feces with fecal urgency   Moderate episode of recurrent major depressive disorder (HCC)   Morbid obesity ([place])   Other Visit Diagnoses     Encounter for completion of form with patient    -  Primary        Myles Gip, DO

## 2022-06-19 DIAGNOSIS — R440 Auditory hallucinations: Secondary | ICD-10-CM | POA: Diagnosis not present

## 2022-06-19 DIAGNOSIS — R258 Other abnormal involuntary movements: Secondary | ICD-10-CM | POA: Diagnosis not present

## 2022-06-23 ENCOUNTER — Other Ambulatory Visit: Payer: Self-pay | Admitting: Family Medicine

## 2022-06-24 DIAGNOSIS — G4733 Obstructive sleep apnea (adult) (pediatric): Secondary | ICD-10-CM | POA: Diagnosis not present

## 2022-06-28 ENCOUNTER — Ambulatory Visit: Payer: 59 | Admitting: Family Medicine

## 2022-07-25 DIAGNOSIS — G4733 Obstructive sleep apnea (adult) (pediatric): Secondary | ICD-10-CM | POA: Diagnosis not present

## 2022-07-26 ENCOUNTER — Ambulatory Visit (INDEPENDENT_AMBULATORY_CARE_PROVIDER_SITE_OTHER): Payer: 59 | Admitting: Family Medicine

## 2022-07-26 ENCOUNTER — Encounter: Payer: Self-pay | Admitting: Family Medicine

## 2022-07-26 VITALS — BP 115/79 | HR 73 | Temp 98.2°F | Resp 16 | Wt 285.0 lb

## 2022-07-26 DIAGNOSIS — W57XXXA Bitten or stung by nonvenomous insect and other nonvenomous arthropods, initial encounter: Secondary | ICD-10-CM | POA: Diagnosis not present

## 2022-07-26 DIAGNOSIS — R251 Tremor, unspecified: Secondary | ICD-10-CM

## 2022-07-26 DIAGNOSIS — G629 Polyneuropathy, unspecified: Secondary | ICD-10-CM | POA: Diagnosis not present

## 2022-07-26 DIAGNOSIS — S30861A Insect bite (nonvenomous) of abdominal wall, initial encounter: Secondary | ICD-10-CM | POA: Diagnosis not present

## 2022-07-26 NOTE — Progress Notes (Unsigned)
    SUBJECTIVE:   CHIEF COMPLAINT / HPI:   Tremors - in 2020, had COVID with hospitalization for acute respiratory failure, since then has had constellation of symptoms including fecal/urinary incontinence, mental slowing, sleep paralysis, tremors, neuropathy that have made it difficult to perform her daily work duties. - currently following with Neuro, GI for workup - recent EEG 8/6 for seizure w/u, head tremors waking from sleep. EEG was not consistent with seizure focus or epileptiform discharges. Due for f/u with Neuro. - reports head tremors are persistent and remain present for about an hour after wakening.  - stopped caffeine and all supplements except for prescription medications and multivitamin, thinks this is helping.  - she is concerned as she sustained a tick bite 3 weeks ago on her lower abdomen. She did not notice a rash. Unsure how long tick was attached. Requesting testing for lyme disease given concern for making her neuropathy and fatigue worse.  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 LE EMG negative for radiculopathy Recommending neuropsych for cognitive impairment eval.    OBJECTIVE:   BP 115/79 (BP Location: Left Arm, Patient Position: Sitting, Cuff Size: Large)   Pulse 73   Temp 98.2 F (36.8 C) (Oral)   Resp 16   Wt 285 lb (129.3 kg)   SpO2 96%   BMI 52.13 kg/m   Gen: well appearing, in NAD Card: Reg rate Lungs: Comfortable WOB on RA Ext: WWP, no edema   ASSESSMENT/PLAN:   Tremors Reviewed recent EEG and previous neurological imaging/testing. Explained as discussed with Neuro this does not completely rule out seizure activity but recommended Neuro follow up for further evaluation/workup. Also agree with neuropsychiatry for evaluation for cognitive slowing. Will obtain lyme testing given report of tick bite and ongoing neuropathy and fatigue however low likelihood given onset prior to tick bite.  F/u with PCP as scheduled.   Myles Gip, DO

## 2022-07-27 ENCOUNTER — Telehealth: Payer: Self-pay

## 2022-07-27 NOTE — Patient Outreach (Signed)
  Care Coordination   07/27/2022 Name: Sandra Wilkins MRN: [id number] DOB: 12/29/1965   Care Coordination Outreach Attempts:  An unsuccessful telephone outreach was attempted today to offer the patient information about available care coordination services as a benefit of their health plan.   Follow Up Plan:  Additional outreach attempts will be made to offer the patient care coordination information and services.   Encounter Outcome:  No Answer  Care Coordination Interventions Activated:  No   Care Coordination Interventions:  No, not indicated    Noreene Larsson RN, MSN, CCM Community Care Coordinator [facility] Mobile: [phone]

## 2022-07-29 ENCOUNTER — Telehealth: Payer: Self-pay

## 2022-07-29 DIAGNOSIS — S30861A Insect bite (nonvenomous) of abdominal wall, initial encounter: Secondary | ICD-10-CM | POA: Diagnosis not present

## 2022-07-29 DIAGNOSIS — W57XXXA Bitten or stung by nonvenomous insect and other nonvenomous arthropods, initial encounter: Secondary | ICD-10-CM | POA: Diagnosis not present

## 2022-07-29 NOTE — Telephone Encounter (Unsigned)
Copied from [place] [id number]. Topic: General - Other >> Jul 29, 2022 10:45 AM Cyndi Bender wrote: Reason for CRM: Jeannett Senior with [address] requests return call from Dr. Ky Barban for a peer to peer meeting. Cb# [phone]

## 2022-08-01 ENCOUNTER — Telehealth: Payer: Self-pay

## 2022-08-01 LAB — LYME DISEASE SEROLOGY W/REFLEX: Lyme Total Antibody EIA: NEGATIVE

## 2022-08-01 NOTE — Telephone Encounter (Signed)
Copied from [place] [phone]. Topic: General - Other >> Aug 01, 2022 11:43 AM Sabas Sous wrote: Reason for CRM: Petagaye calling from [employer] to schedule a peer to peer with Dr. Shela Commons  Best contact: [phone]  They want to know when the provider is available to discuss if the patient is eligible for the benefit that they applied for.

## 2022-08-01 NOTE — Telephone Encounter (Signed)
Returned call, no answer, left voicemail.

## 2022-08-01 NOTE — Telephone Encounter (Signed)
[place] and attempted to speak with peer to peer physician Elliot Cousin at [phone] but was unavailable. LVM to call back. If not available to discuss today, can reattempt tomorrow afternoon ~12:15 or later.

## 2022-08-02 ENCOUNTER — Telehealth: Payer: Self-pay

## 2022-08-02 NOTE — Telephone Encounter (Signed)
Spoke with [employer]. Peer to peer review completed with Dr. Elliot Cousin yesterday afternoon. No further peer to peer needed per [employer].

## 2022-08-02 NOTE — Telephone Encounter (Signed)
Copied from [place] [id number]. Topic: General - Inquiry >> Aug 02, 2022  1:21 PM Erskine Squibb wrote: Reason for CRM: Petagaye with [employer] called back in regards to [employer] and a pier to [employer] review for disability benefits for the patient. She states if the provider calls back today she will be talking to a Dr Charm Rings. Please assist further

## 2022-08-22 DIAGNOSIS — G4733 Obstructive sleep apnea (adult) (pediatric): Secondary | ICD-10-CM | POA: Diagnosis not present

## 2022-09-19 NOTE — Progress Notes (Unsigned)
I,Joseline E Rosas,acting as a scribe for Lavon Paganini, MD.,have documented all relevant documentation on the behalf of Lavon Paganini, MD,as directed by  Lavon Paganini, MD while in the presence of Lavon Paganini, MD.   Established patient visit   Patient: Sandra Wilkins   DOB: May 12, 1966   56 y.o. Female  MRN: [id number] Visit Date: 10/05/2022  Today's healthcare provider: Lavon Paganini, MD   Chief Complaint  Patient presents with   Follow-Up Chronic Disease   Subjective    HPI  Anxiety, Follow-up  She was last seen for anxiety 3 months ago. Changes made at last visit include continue current treatment.   She reports excellent compliance with treatment. She reports excellent tolerance of treatment. She is not having side effects.   She feels her anxiety is severe and Unchanged since last visit.Reports that her anxiety is related to her health.   Symptoms: No chest pain Yes difficulty concentrating  No dizziness Yes fatigue  No feelings of losing control Yes insomnia  Yes irritable No palpitations  Yes panic attacks No racing thoughts  No shortness of breath Yes sweating  Yes tremors/shakes    GAD-7 Results    10-05-22   11:02 AM 10/15/2019    4:18 PM  GAD-7 Generalized Anxiety Disorder Screening Tool  1. Feeling Nervous, Anxious, or on Edge 3 0  2. Not Being Able to Stop or Control Worrying 3 2  3. Worrying Too Much About Different Things 3 3  4. Trouble Relaxing 3 2  5. Being So Restless it's Hard To Sit Still 1 0  6. Becoming Easily Annoyed or Irritable 1 1  7. Feeling Afraid As If Something Awful Might Happen 2 0  Total GAD-7 Score 16 8  Difficulty At Work, Home, or Getting  Along With Others? Somewhat difficult Somewhat difficult    PHQ-9 Scores    10/05/2022   10:59 AM 07/26/2022   11:22 AM 06/14/2022   11:18 AM  PHQ9 SCORE ONLY  PHQ-9 Total Score _0 --------------------------------------------------------------------------------------------------- Depression, Follow-up  She  was last seen for this 3 months ago. Changes made at last visit include continue current treatment.   She reports excellent compliance with treatment. She is not having side effects.   She reports excellent tolerance of treatment. Current symptoms include: depressed mood, difficulty concentrating, fatigue, feelings of worthlessness/guilt, impaired memory, insomnia, and recurrent thoughts of death      Oct 05, 2022   10:59 AM 07/26/2022   11:22 AM 06/14/2022   11:18 AM  Depression screen PHQ 2/9  Decreased Interest _1 Down, Depressed, Hopeless _2 PHQ - 2 Score _3 Altered sleeping _4 Tired, decreased energy _5 Change in appetite _6 Feeling bad or failure about yourself  _7 Trouble concentrating _8 Moving slowly or fidgety/restless 2 0 1  Suicidal thoughts 0 0 0  PHQ-9 Score _9 Difficult doing work/chores Extremely dIfficult Extremely dIfficult Very difficult    -----------------------------------------------------------------------------------------  Medications: Outpatient Medications Prior to Visit  Medication Sig   ALPRAZolam (XANAX) 0.5 MG tablet Take 0.5-1 tablets (0.25-0.5 mg total) by mouth every 4 (four) hours as needed for anxiety.   colestipol (COLESTID) 1 g tablet TAKE ONE TABLET (1GRAM) BY MOUTH TWICE DAILY   omeprazole (PRILOSEC) 20 MG capsule TAKE 1 CAPSULE (20 MG) BY MOUTH EVERY DAY  Pancrelipase, Lip-Prot-Amyl, (ZENPEP) 40000-126000 units CPEP Take 2 capsules with the first bite of each meal and 1 capsule with the first bite of each snack   sertraline (ZOLOFT) 100 MG tablet TAKE ONE TABLET (100 MG) BY MOUTH TWICE DAILY   topiramate (TOPAMAX) 50 MG tablet Take 50 mg by mouth at bedtime.   triamcinolone ointment (KENALOG) 0.5 % Apply 1 application. topically 2 (two) times daily.   No  facility-administered medications prior to visit.    Review of Systems per HPI  Last CBC Lab Results  Component Value Date   WBC 6.5 09/13/2021   HGB 14.7 09/13/2021   HCT 44.2 09/13/2021   MCV 87 09/13/2021   MCH 29.0 09/13/2021   RDW 13.8 09/13/2021   PLT 216 65/68/1275   Last metabolic panel Lab Results  Component Value Date   GLUCOSE 103 (H) 09/13/2021   NA 141 09/13/2021   K 4.1 09/13/2021   CL 104 09/13/2021   CO2 25 09/13/2021   BUN 14 09/13/2021   CREATININE 1.08 (H) 09/13/2021   EGFR 61 09/13/2021   CALCIUM 9.0 09/13/2021   PROT 6.8 09/13/2021   ALBUMIN 4.1 09/13/2021   LABGLOB 2.7 09/13/2021   AGRATIO 1.5 09/13/2021   BILITOT 0.4 09/13/2021   ALKPHOS 127 (H) 09/13/2021   AST 45 (H) 09/13/2021   ALT 78 (H) 09/13/2021   ANIONGAP 10 11/06/2019   Last lipids Lab Results  Component Value Date   CHOL 190 09/13/2021   HDL 35 (L) 09/13/2021   LDLCALC 118 (H) 09/13/2021   TRIG 210 (H) 09/13/2021   CHOLHDL 5.4 (H) 09/13/2021   Last hemoglobin A1c Lab Results  Component Value Date   HGBA1C 5.9 (H) 09/13/2021   Last thyroid functions Lab Results  Component Value Date   TSH 3.34 01/17/2022   T4TOTAL 6.3 07/22/2016   Last vitamin D Lab Results  Component Value Date   VD25OH 24.2 01/17/2022   Last vitamin B12 and Folate Lab Results  Component Value Date   VITAMINB12 422 10/22/2021       Objective    BP 130/88 (BP Location: Left Arm, Patient Position: Sitting, Cuff Size: Large)   Pulse 69   Temp 98.9 F (37.2 C) (Oral)   Resp 16   Ht _0  (1.575 m)   Wt 282 lb 4.8 oz (128.1 kg)   BMI 51.63 kg/m  BP Readings from Last 3 Encounters:  09/20/22 130/88  07/26/22 115/79  06/14/22 118/81   Wt Readings from Last 3 Encounters:  09/20/22 282 lb 4.8 oz (128.1 kg)  07/26/22 285 lb (129.3 kg)  06/14/22 284 lb 14.4 oz (129.2 kg)      Physical Exam Vitals reviewed.  Constitutional:      General: She is not in acute distress.    Appearance:  Normal appearance. She is well-developed. She is not diaphoretic.  HENT:     Head: Normocephalic and atraumatic.  Eyes:     General: No scleral icterus.    Conjunctiva/sclera: Conjunctivae normal.  Neck:     Thyroid: No thyromegaly.  Cardiovascular:     Rate and Rhythm: Normal rate and regular rhythm.     Pulses: Normal pulses.     Heart sounds: Normal heart sounds. No murmur heard. Pulmonary:     Effort: Pulmonary effort is normal. No respiratory distress.     Breath sounds: Normal breath sounds. No wheezing, rhonchi or rales.  Musculoskeletal:     Cervical back: Neck supple.  Right lower leg: No edema.     Left lower leg: No edema.  Lymphadenopathy:     Cervical: No cervical adenopathy.  Skin:    General: Skin is warm and dry.     Findings: No rash.  Neurological:     Mental Status: She is alert and oriented to person, place, and time. Mental status is at baseline.  Psychiatric:        Mood and Affect: Mood normal.        Behavior: Behavior normal.       Results for orders placed or performed in visit on 09/20/22  VITAMIN D 25 Hydroxy (Vit-D Deficiency, Fractures)  Result Value Ref Range   Vit D, 25-Hydroxy 24.2   TSH  Result Value Ref Range   TSH 3.34 0.41 - 5.90    Assessment & Plan     Problem List Items Addressed This Visit       Digestive   Nonalcoholic fatty liver disease without nonalcoholic steatohepatitis (NASH)    Continue to montior LFTs      Relevant Orders   Comprehensive metabolic panel     Nervous and Auditory   Neuropathy    Generalized peripheral neuropathy worse in feet, worse at night Have checked B12 levels Failed gabapentin due to hallucinations No alcohol intake, diabetes        Other   Anxiety disorder - Primary    Chronic and stbale Continues to have anxiety about her health Consider therapy Continue zoloft at current dose Prn xanax      Pre-diabetes    Recommend low carb diet Recheck A1c       Relevant Orders    Hemoglobin A1c   Morbid obesity (HCC)    Discussed importance of healthy weight management Discussed diet and exercise       Relevant Orders   Comprehensive metabolic panel   Lipid Panel With LDL/HDL Ratio   Hemoglobin A1c   Mixed hyperlipidemia    Reviewed last lipid panel Not currently on a statin Recheck FLP and CMP Discussed diet and exercise       Relevant Orders   Comprehensive metabolic panel   Lipid Panel With LDL/HDL Ratio   Moderate episode of recurrent major depressive disorder (HCC)    Chronic and stable Her health wears on her mental health Continue zoloft st current dose Consider therapy        Return in about 3 months (around 12/21/2022) for CPE.      I, Lavon Paganini, MD, have reviewed all documentation for this visit. The documentation on 09/20/22 for the exam, diagnosis, procedures, and orders are all accurate and complete.   Bacigalupo, Dionne Bucy, MD, MPH [facility]

## 2022-09-20 ENCOUNTER — Encounter: Payer: Self-pay | Admitting: Family Medicine

## 2022-09-20 ENCOUNTER — Ambulatory Visit: Payer: 59 | Admitting: Family Medicine

## 2022-09-20 VITALS — BP 130/88 | HR 69 | Temp 98.9°F | Resp 16 | Ht 62.0 in | Wt 282.3 lb

## 2022-09-20 DIAGNOSIS — K760 Fatty (change of) liver, not elsewhere classified: Secondary | ICD-10-CM

## 2022-09-20 DIAGNOSIS — R7303 Prediabetes: Secondary | ICD-10-CM

## 2022-09-20 DIAGNOSIS — G629 Polyneuropathy, unspecified: Secondary | ICD-10-CM | POA: Diagnosis not present

## 2022-09-20 DIAGNOSIS — F331 Major depressive disorder, recurrent, moderate: Secondary | ICD-10-CM | POA: Diagnosis not present

## 2022-09-20 DIAGNOSIS — R69 Illness, unspecified: Secondary | ICD-10-CM | POA: Diagnosis not present

## 2022-09-20 DIAGNOSIS — F411 Generalized anxiety disorder: Secondary | ICD-10-CM | POA: Diagnosis not present

## 2022-09-20 DIAGNOSIS — E782 Mixed hyperlipidemia: Secondary | ICD-10-CM | POA: Diagnosis not present

## 2022-09-20 NOTE — Assessment & Plan Note (Signed)
Recommend low carb diet °Recheck A1c  °

## 2022-09-20 NOTE — Assessment & Plan Note (Signed)
Generalized peripheral neuropathy worse in feet, worse at night Have checked B12 levels Failed gabapentin due to hallucinations No alcohol intake, diabetes

## 2022-09-20 NOTE — Assessment & Plan Note (Signed)
Chronic and stable Her health wears on her mental health Continue zoloft st current dose Consider therapy

## 2022-09-20 NOTE — Assessment & Plan Note (Signed)
Chronic and stbale Continues to have anxiety about her health Consider therapy Continue zoloft at current dose Prn xanax

## 2022-09-20 NOTE — Assessment & Plan Note (Signed)
Discussed importance of healthy weight management Discussed diet and exercise  

## 2022-09-20 NOTE — Assessment & Plan Note (Signed)
Continue to montior LFTs

## 2022-09-20 NOTE — Assessment & Plan Note (Signed)
Reviewed last lipid panel Not currently on a statin Recheck FLP and CMP Discussed diet and exercise  

## 2022-09-22 ENCOUNTER — Encounter: Payer: Self-pay | Admitting: Family Medicine

## 2022-09-22 DIAGNOSIS — K760 Fatty (change of) liver, not elsewhere classified: Secondary | ICD-10-CM | POA: Diagnosis not present

## 2022-09-22 DIAGNOSIS — E782 Mixed hyperlipidemia: Secondary | ICD-10-CM | POA: Diagnosis not present

## 2022-09-22 DIAGNOSIS — R7303 Prediabetes: Secondary | ICD-10-CM | POA: Diagnosis not present

## 2022-09-23 LAB — COMPREHENSIVE METABOLIC PANEL
ALT: 30 IU/L (ref 0–32)
AST: 22 IU/L (ref 0–40)
Albumin/Globulin Ratio: 1.9 (ref 1.2–2.2)
Albumin: 4 g/dL (ref 3.8–4.9)
Alkaline Phosphatase: 116 IU/L (ref 44–121)
BUN/Creatinine Ratio: 13 (ref 9–23)
BUN: 15 mg/dL (ref 6–24)
Bilirubin Total: 0.4 mg/dL (ref 0.0–1.2)
CO2: 22 mmol/L (ref 20–29)
Calcium: 8.6 mg/dL — ABNORMAL LOW (ref 8.7–10.2)
Chloride: 107 mmol/L — ABNORMAL HIGH (ref 96–106)
Creatinine, Ser: 1.13 mg/dL — ABNORMAL HIGH (ref 0.57–1.00)
Globulin, Total: 2.1 g/dL (ref 1.5–4.5)
Glucose: 100 mg/dL — ABNORMAL HIGH (ref 70–99)
Potassium: 3.9 mmol/L (ref 3.5–5.2)
Sodium: 144 mmol/L (ref 134–144)
Total Protein: 6.1 g/dL (ref 6.0–8.5)
eGFR: 57 mL/min/{1.73_m2} — ABNORMAL LOW (ref >59)

## 2022-09-23 LAB — LIPID PANEL WITH LDL/HDL RATIO
Cholesterol, Total: 169 mg/dL (ref 100–199)
HDL: 30 mg/dL — ABNORMAL LOW (ref >39)
LDL Chol Calc (NIH): 105 mg/dL — ABNORMAL HIGH (ref 0–99)
LDL/HDL Ratio: 3.5 ratio — ABNORMAL HIGH (ref 0.0–3.2)
Triglycerides: 192 mg/dL — ABNORMAL HIGH (ref 0–149)
VLDL Cholesterol Cal: 34 mg/dL (ref 5–40)

## 2022-09-23 LAB — HEMOGLOBIN A1C
Est. average glucose Bld gHb Est-mCnc: 108 mg/dL
Hgb A1c MFr Bld: 5.4 % (ref 4.8–5.6)

## 2022-10-27 ENCOUNTER — Encounter: Payer: Self-pay | Admitting: Family Medicine

## 2022-10-27 DIAGNOSIS — F410 Panic disorder [episodic paroxysmal anxiety] without agoraphobia: Secondary | ICD-10-CM

## 2022-10-27 MED ORDER — ALPRAZOLAM 0.5 MG PO TABS: 0.2500 mg | ORAL_TABLET | ORAL | 2 refills | Status: DC | PRN

## 2022-11-21 ENCOUNTER — Ambulatory Visit: Payer: 59 | Admitting: Gastroenterology

## 2022-11-24 DIAGNOSIS — G4733 Obstructive sleep apnea (adult) (pediatric): Secondary | ICD-10-CM | POA: Diagnosis not present

## 2022-12-26 ENCOUNTER — Encounter: Payer: 59 | Admitting: Family Medicine

## 2023-02-21 ENCOUNTER — Other Ambulatory Visit: Payer: Self-pay | Admitting: Family Medicine

## 2023-02-21 DIAGNOSIS — F410 Panic disorder [episodic paroxysmal anxiety] without agoraphobia: Secondary | ICD-10-CM

## 2023-02-22 DIAGNOSIS — G4733 Obstructive sleep apnea (adult) (pediatric): Secondary | ICD-10-CM | POA: Diagnosis not present

## 2023-02-22 NOTE — Telephone Encounter (Signed)
Requested medication (s) are due for refill today: yes  Requested medication (s) are on the active medication list: yes  Last refill:  10/27/22  Future visit scheduled: yes  Notes to clinic:  Unable to refill per protocol, cannot delegate.      Requested Prescriptions  Pending Prescriptions Disp Refills   ALPRAZolam (XANAX) 0.5 MG tablet [Pharmacy Med Name: ALPRAZOLAM 0.5 MG TAB] 30 tablet     Sig: TAKE 1/2 TO ONE TABLET BY MOUTH EVERY FOUR HOURS AS NEEDED FOR ANXIETY.     Not Delegated - Psychiatry: Anxiolytics/Hypnotics 2 Failed - 02/21/2023  3:58 PM      Failed - This refill cannot be delegated      Failed - Urine Drug Screen completed in last 360 days      Passed - Patient is not pregnant      Passed - Valid encounter within last 6 months    Recent Outpatient Visits           5 months ago Generalized anxiety disorder   [facility], Marzella Schlein, MD   7 months ago Tick bite of abdominal wall, initial encounter   [facility] Caro Laroche, DO   8 months ago Encounter for completion of form with patient   [facility] Caro Laroche, DO   1 year ago Morbid obesity [facility])   [facility] [place], Marzella Schlein, MD   1 year ago Encounter for annual physical exam   [facility], Marzella Schlein, MD

## 2023-03-13 ENCOUNTER — Encounter: Payer: Self-pay | Admitting: Family Medicine

## 2023-03-13 DIAGNOSIS — N6320 Unspecified lump in the left breast, unspecified quadrant: Secondary | ICD-10-CM

## 2023-03-31 ENCOUNTER — Other Ambulatory Visit: Payer: 59

## 2023-04-14 ENCOUNTER — Ambulatory Visit
Admission: RE | Admit: 2023-04-14 | Discharge: 2023-04-14 | Disposition: A | Discharge status: Home / Self Care | Payer: 59 | Source: Ambulatory Visit | Attending: Family Medicine | Admitting: Family Medicine

## 2023-04-14 DIAGNOSIS — N6320 Unspecified lump in the left breast, unspecified quadrant: Secondary | ICD-10-CM

## 2023-04-14 DIAGNOSIS — N6489 Other specified disorders of breast: Secondary | ICD-10-CM | POA: Diagnosis not present

## 2023-04-14 DIAGNOSIS — R92323 Mammographic fibroglandular density, bilateral breasts: Secondary | ICD-10-CM | POA: Diagnosis not present

## 2023-05-24 DIAGNOSIS — G4733 Obstructive sleep apnea (adult) (pediatric): Secondary | ICD-10-CM | POA: Diagnosis not present

## 2023-06-20 ENCOUNTER — Encounter: Payer: Self-pay | Admitting: Family Medicine

## 2023-06-20 ENCOUNTER — Ambulatory Visit (INDEPENDENT_AMBULATORY_CARE_PROVIDER_SITE_OTHER): Payer: 59 | Admitting: Family Medicine

## 2023-06-20 VITALS — BP 106/78 | HR 72 | Temp 97.6°F | Resp 12 | Ht 62.0 in | Wt 254.6 lb

## 2023-06-20 DIAGNOSIS — E782 Mixed hyperlipidemia: Secondary | ICD-10-CM | POA: Diagnosis not present

## 2023-06-20 DIAGNOSIS — G4733 Obstructive sleep apnea (adult) (pediatric): Secondary | ICD-10-CM

## 2023-06-20 DIAGNOSIS — F331 Major depressive disorder, recurrent, moderate: Secondary | ICD-10-CM | POA: Diagnosis not present

## 2023-06-20 DIAGNOSIS — R7303 Prediabetes: Secondary | ICD-10-CM | POA: Diagnosis not present

## 2023-06-20 DIAGNOSIS — G629 Polyneuropathy, unspecified: Secondary | ICD-10-CM

## 2023-06-20 DIAGNOSIS — F410 Panic disorder [episodic paroxysmal anxiety] without agoraphobia: Secondary | ICD-10-CM

## 2023-06-20 DIAGNOSIS — F411 Generalized anxiety disorder: Secondary | ICD-10-CM | POA: Diagnosis not present

## 2023-06-20 MED ORDER — SERTRALINE HCL 100 MG PO TABS
100.0000 mg | ORAL_TABLET | Freq: Every day | ORAL | 1 refills | Status: DC
Start: 2023-06-20 — End: 2024-01-29

## 2023-06-20 MED ORDER — ALPRAZOLAM 0.5 MG PO TABS: 0.2500 mg | ORAL_TABLET | Freq: Two times a day (BID) | ORAL | 1 refills | Status: AC | PRN

## 2023-06-20 NOTE — Assessment & Plan Note (Addendum)
Chronic  Failed Gabapentin due to hallucinations  Having tremors at night

## 2023-06-20 NOTE — Assessment & Plan Note (Signed)
Most recent lipid panel showed elevated LDL, triglycerides, and low HDL  Not currently on a statin Dicussed importance of diet and exercise

## 2023-06-20 NOTE — Assessment & Plan Note (Signed)
Congratulated on weight loss ?Discussed importance of healthy weight management ?Discussed diet and exercise  ?

## 2023-06-20 NOTE — Progress Notes (Signed)
Established Patient Office Visit  Subjective   Patient ID: Sandra Wilkins, female    DOB: 1966/04/14  Age: 57 y.o. MRN: [id number]  Chief Complaint  Patient presents with   Medical Management of Chronic Issues    Patient is here for medical management of chronic conditions.    Discussed the use of AI scribe software for clinical note transcription with the patient, who gave verbal consent to proceed.  History of Present Illness   The patient, with a history of neuropathy, anxiety, and digestive issues, presents with concerns about recent exposure to mold in their home. They report a smell of dirt and express worry about potential health effects, especially as they use a CPAP machine for sleep. The patient also mentions worsening tremors, particularly at night, which have been disrupting their sleep. They have been taking alprazolam to manage these symptoms, but report that it was not effective the previous night.  The patient is currently working full time, which they report as challenging but necessary for their mental well-being after being denied disability. They have lost significant weight, which they attribute to not eating during the day due to their work schedule and digestive issues. They express concern about their weight loss and request lab work to check their health status.  The patient also discusses their medication regimen, which includes Zoloft, alprazolam, and Topamax. They report no significant changes, except for discontinuing one digestive medication (pancrelipase) that they felt was not helping. They request a refill of alprazolam and a correction of their Zoloft prescription in the system to reflect their actual usage.  Finally, the patient asks about the shingles vaccine, expressing concern about potential side effects given their neuropathy. They mention reading about others' negative experiences with the vaccine and are worried about potential worsening of their  symptoms.         ROS    Objective:     BP 106/78 (BP Location: Left Arm, Patient Position: Sitting, Cuff Size: Large)   Pulse 72   Temp 97.6 F (36.4 C) (Temporal)   Resp 12   Ht 5\' 2"  (1.575 m)   Wt 254 lb 9.6 oz (115.5 kg)   SpO2 97%   BMI 46.57 kg/m    Physical Exam Vitals reviewed.  Constitutional:      General: She is not in acute distress.    Appearance: Normal appearance. She is well-developed. She is not diaphoretic.  HENT:     Head: Normocephalic and atraumatic.  Eyes:     General: No scleral icterus.    Conjunctiva/sclera: Conjunctivae normal.  Neck:     Thyroid: No thyromegaly.  Cardiovascular:     Rate and Rhythm: Normal rate and regular rhythm.     Pulses: Normal pulses.     Heart sounds: Normal heart sounds. No murmur heard. Pulmonary:     Effort: Pulmonary effort is normal. No respiratory distress.     Breath sounds: Normal breath sounds. No wheezing, rhonchi or rales.  Musculoskeletal:     Cervical back: Neck supple.     Right lower leg: No edema.     Left lower leg: No edema.  Lymphadenopathy:     Cervical: No cervical adenopathy.  Skin:    General: Skin is warm and dry.     Findings: No rash.  Neurological:     Mental Status: She is alert and oriented to person, place, and time. Mental status is at baseline.  Psychiatric:        Mood  and Affect: Mood normal.        Behavior: Behavior normal.      No results found for any visits on 06/20/23.    The 10-year ASCVD risk score (Arnett DK, et al., 2019) is: 2.1%    Assessment & Plan:   Problem List Items Addressed This Visit       Respiratory   OSA (obstructive sleep apnea)    Using CPAP with good compliance Symptoms are better        Nervous and Auditory   Neuropathy    Chronic  Failed Gabapentin due to hallucinations  Having tremors at night        Other   Anxiety disorder   Relevant Medications   sertraline (ZOLOFT) 100 MG tablet   ALPRAZolam (XANAX) 0.5 MG  tablet   Pre-diabetes - Primary    Well controlled Last A1c was 5.4, lower than the Pre-DM range  CTM Repeat HbgA1c      Relevant Orders   Hemoglobin A1c   Morbid obesity (HCC)    Congratulated on weight loss Discussed importance of healthy weight management Discussed diet and exercise       Mixed hyperlipidemia    Most recent lipid panel showed elevated LDL, triglycerides, and low HDL  Not currently on a statin Dicussed importance of diet and exercise       Relevant Orders   Comprehensive metabolic panel   Lipid panel   Moderate episode of recurrent major depressive disorder (HCC)   Relevant Medications   sertraline (ZOLOFT) 100 MG tablet   ALPRAZolam (XANAX) 0.5 MG tablet   Other Visit Diagnoses     Panic attacks       Relevant Medications   sertraline (ZOLOFT) 100 MG tablet   ALPRAZolam (XANAX) 0.5 MG tablet          Mold Exposure Concerns about potential health effects from mold exposure in the home. No specific symptoms attributed to mold exposure. Patient is taking appropriate steps to mitigate exposure including encapsulation, cleaning, and use of an air purifier. -No specific lab tests indicated at this time.  Anxiety Reports increased anxiety at work leading to increased use of alprazolam. -Continue current regimen of alprazolam 0.25mg  before work and 0.5mg  at bedtime.  Neuropathy Reports worsening of symptoms, particularly tremors at night. -Continue current regimen of Topamax.  Weight Loss Significant weight loss reported due to changes in eating habits related to work schedule and digestive issues. -Order labs including A1C, cholesterol, and kidney and liver function to assess impact of weight loss on overall health.  Shingles Vaccine Discussed concerns about potential side effects of the shingles vaccine, particularly in relation to neuropathy. -Advised to proceed with shingles vaccine, with second dose scheduled on a day when patient can rest  afterwards.  Follow-up -Schedule physical in six months. -Check labs today.        Return in about 6 months (around 12/21/2023) for CPE.    Patient seen along with MS3 student Jodi Marble. I personally evaluated this patient along with the student, and verified all aspects of the history, physical exam, and medical decision making as documented by the student. I agree with the student's documentation and have made all necessary edits.   , Marzella Schlein, MD, MPH [facility]

## 2023-06-20 NOTE — Assessment & Plan Note (Signed)
Well controlled Last A1c was 5.4, lower than the Pre-DM range  CTM Repeat HbgA1c

## 2023-06-20 NOTE — Assessment & Plan Note (Signed)
Using CPAP with good compliance Symptoms are better

## 2023-07-03 ENCOUNTER — Encounter: Payer: Self-pay | Admitting: Family Medicine

## 2023-07-03 ENCOUNTER — Other Ambulatory Visit: Payer: Self-pay | Admitting: Family Medicine

## 2023-07-03 NOTE — Telephone Encounter (Signed)
Can you confirm this with patient? Our chart says 50mg  at bedtime. This is for 100mg  qhs

## 2023-07-03 NOTE — Telephone Encounter (Signed)
Good morning,   100mg  1 tablet daily at night   Andre Lefort to [facility] (supporting Erasmo Downer, MD)    07/03/23 11:16 AM Hello Dr B,   I need refills on my medicine and I no longer go to the Neurologist that I was seeing. I had the pharmacy send the request to you but the front staff said you've never prescribed the medication. I've gone without it and my internal tremors are now present in the day as well as night, I feel very fatigued and nervous feeling.  I told them I was just recently seen by you.  Please let me know if I need to come back in.   Thank you, Sandra Wilkins

## 2023-07-03 NOTE — Telephone Encounter (Signed)
Medication Refill - Medication: topiramate (TOPAMAX) 50 MG tablet [[id number]]   Historical Provider- Neurology   Has the patient contacted their pharmacy? Yes.   (Agent: If no, request that the patient contact the pharmacy for the refill. If patient does not wish to contact the pharmacy document the reason why and proceed with request.) (Agent: If yes, when and what did the pharmacy advise?)  Preferred Pharmacy (with phone number or street name): [facility] - [place], [place] - [address] Phone: [phone]  Fax: [phone]   Has the patient been seen for an appointment in the last year OR does the patient have an upcoming appointment? Yes.    Agent: Please be advised that RX refills may take up to 3 business days. We ask that you follow-up with your pharmacy.

## 2023-07-05 NOTE — Telephone Encounter (Signed)
Requested medication (s) are due for refill today: Amount not specified  Requested medication (s) are on the active medication list: yes    Last refill: 02/17/22  Amount not specified  Future visit scheduled no  Notes to clinic:Historical provider, please review. Thank you.  Requested Prescriptions  Pending Prescriptions Disp Refills   topiramate (TOPAMAX) 50 MG tablet      Sig: Take 1 tablet (50 mg total) by mouth at bedtime.     Neurology: Anticonvulsants - topiramate & zonisamide Failed - 07/03/2023 11:26 AM      Failed - Cr in normal range and within 360 days    Creatinine, Ser  Date Value Ref Range Status  06/20/2023 1.19 (H) 0.57 - 1.00 mg/dL Final         Passed - CO2 in normal range and within 360 days    CO2  Date Value Ref Range Status  06/20/2023 22 20 - 29 mmol/L Final         Passed - ALT in normal range and within 360 days    ALT  Date Value Ref Range Status  06/20/2023 28 0 - 32 IU/L Final         Passed - AST in normal range and within 360 days    AST  Date Value Ref Range Status  06/20/2023 18 0 - 40 IU/L Final         Passed - Completed PHQ-2 or PHQ-9 in the last 360 days      Passed - Valid encounter within last 12 months    Recent Outpatient Visits           2 weeks ago Pre-diabetes   [place] [facility] [place], Marzella Schlein, MD   9 months ago Generalized anxiety disorder   [facility] Erasmo Downer, MD   11 months ago Tick bite of abdominal wall, initial encounter   [facility] Caro Laroche, DO   1 year ago Encounter for completion of form with patient   [facility] Caro Laroche, DO   1 year ago Morbid obesity [facility])   [place] [facility] [place], Marzella Schlein, MD

## 2023-07-21 ENCOUNTER — Other Ambulatory Visit: Payer: Self-pay | Admitting: Medical Genetics

## 2023-07-21 DIAGNOSIS — Z006 Encounter for examination for normal comparison and control in clinical research program: Secondary | ICD-10-CM

## 2023-08-28 DIAGNOSIS — G4733 Obstructive sleep apnea (adult) (pediatric): Secondary | ICD-10-CM | POA: Diagnosis not present

## 2023-09-30 ENCOUNTER — Other Ambulatory Visit: Payer: Self-pay | Attending: Medical Genetics

## 2023-11-10 DIAGNOSIS — Z23 Encounter for immunization: Secondary | ICD-10-CM | POA: Diagnosis not present

## 2023-12-10 ENCOUNTER — Encounter: Payer: Self-pay | Admitting: Family Medicine

## 2023-12-19 ENCOUNTER — Other Ambulatory Visit: Payer: Self-pay | Admitting: Family Medicine

## 2023-12-21 NOTE — Telephone Encounter (Signed)
 Requested medication (s) are due for refill today: yes  Requested medication (s) are on the active medication list: yes  Last refill:  07/03/23 #90 1 RF  Future visit scheduled: yes  Notes to clinic:  overdue lab work   Requested Prescriptions  Pending Prescriptions Disp Refills   topiramate  (TOPAMAX ) 100 MG tablet [Pharmacy Med Name: TOPIRAMATE  100 MG TAB] 90 tablet 1    Sig: TAKE 1 TABLET BY MOUTH DAILY     Neurology: Anticonvulsants - topiramate  & zonisamide Failed - 12/21/2023  9:46 AM      Failed - Cr in normal range and within 360 days    Creatinine, Ser  Date Value Ref Range Status  06/20/2023 1.19 (H) 0.57 - 1.00 mg/dL Final         Passed - CO2 in normal range and within 360 days    CO2  Date Value Ref Range Status  06/20/2023 22 20 - 29 mmol/L Final         Passed - ALT in normal range and within 360 days    ALT  Date Value Ref Range Status  06/20/2023 28 0 - 32 IU/L Final         Passed - AST in normal range and within 360 days    AST  Date Value Ref Range Status  06/20/2023 18 0 - 40 IU/L Final         Passed - Completed PHQ-2 or PHQ-9 in the last 360 days      Passed - Valid encounter within last 12 months    Recent Outpatient Visits           6 months ago Pre-diabetes   [place] [facility] [place], Jon HERO, MD   1 year ago Generalized anxiety disorder   [place] [facility] [place], Jon HERO, MD   1 year ago Tick bite of abdominal wall, initial encounter   [facility] Madelon Donald HERO, DO   1 year ago Encounter for completion of form with patient   [facility] Madelon Donald HERO, DO   1 year ago Morbid obesity [facility])   [facility] Bacigalupo, Jon HERO, MD       Future Appointments             In 1 week Bacigalupo, Jon HERO, MD [facility]

## 2023-12-29 ENCOUNTER — Ambulatory Visit: Payer: Self-pay | Admitting: Family Medicine

## 2024-01-29 ENCOUNTER — Other Ambulatory Visit: Payer: Self-pay | Admitting: Family Medicine

## 2024-04-24 ENCOUNTER — Other Ambulatory Visit: Payer: Self-pay | Admitting: Family Medicine

## 2024-06-04 ENCOUNTER — Other Ambulatory Visit: Payer: Self-pay | Admitting: Physician Assistant

## 2024-06-17 ENCOUNTER — Other Ambulatory Visit: Payer: Self-pay | Admitting: Physician Assistant

## 2024-06-17 ENCOUNTER — Other Ambulatory Visit: Payer: Self-pay | Admitting: Family Medicine

## 2024-06-20 ENCOUNTER — Other Ambulatory Visit: Payer: Self-pay | Admitting: Family Medicine

## 2024-06-20 ENCOUNTER — Other Ambulatory Visit: Payer: Self-pay

## 2024-06-20 ENCOUNTER — Ambulatory Visit: Payer: Self-pay

## 2024-06-20 DIAGNOSIS — F411 Generalized anxiety disorder: Secondary | ICD-10-CM

## 2024-06-20 MED ORDER — SERTRALINE HCL 100 MG PO TABS: 100.0000 mg | ORAL_TABLET | Freq: Every day | ORAL | 0 refills | Status: DC

## 2024-06-20 NOTE — Telephone Encounter (Signed)
 FYI Only or Action Required?: Action required by provider: medication refill request. Pt ran out of Sertraline  7-10 days ago, requesting refill as FU OV has been scheduled. Refill encounter created.  Patient was last seen in primary care on 06/20/2023 by Myrla Jon HERO, MD.  Called Nurse Triage reporting Anxiety.  Symptoms began several days ago.  Interventions attempted: Rest, hydration, or home remedies.  Symptoms are: gradually worsening.  Triage Disposition: See PCP When Office is Open (Within 3 Days)  Patient/caregiver understands and will follow disposition?: Yes, appt scheduled 09/25        Copied from CRM #[id number]. Topic: Clinical - Medication Question >> Jun 20, 2024  1:35 PM Winona R wrote: Pt is having some anxiety being that she is waiting on her  sertraline  (ZOLOFT ) 100 MG tablet refill, which has been denied. Reason for Disposition  MODERATE anxiety (e.g., persistent or frequent anxiety symptoms; interferes with sleep, school, or work)  Answer Assessment - Initial Assessment Questions 1. CONCERN: Did anything happen that prompted you to call today?      Increased anxiety 2. ANXIETY SYMPTOMS: Can you describe how you (your loved one; patient) have been feeling? (e.g., tense, restless, panicky, anxious, keyed up, overwhelmed, sense of impending doom).      Irritability, anxious 3. ONSET: How long have you been feeling this way? (e.g., hours, days, weeks)     7-10 days ago when medication was stopped 4. SEVERITY: How would you rate the level of anxiety? (e.g., 0 - 10; or mild, moderate, severe).     6/10 today 5. FUNCTIONAL IMPAIRMENT: How have these feelings affected your ability to do daily activities? Have you had more difficulty than usual doing your normal daily activities? (e.g., getting better, same, worse; self-care, school, work, interactions)     Yes 6. HISTORY: Have you felt this way before? Have you ever been diagnosed with an anxiety  problem in the past? (e.g., generalized anxiety disorder, panic attacks, PTSD). If Yes, ask: How was this problem treated? (e.g., medicines, counseling, etc.)     Yes 7. RISK OF HARM - SUICIDAL IDEATION: Do you ever have thoughts of hurting or killing yourself? If Yes, ask:  Do you have these feelings now? Do you have a plan on how you would do this?     No 8. TREATMENT:  What has been done so far to treat this anxiety? (e.g., medicines, relaxation strategies). What has helped?     Sertraline  9. THERAPIST: Do you have a counselor or therapist? If Yes, ask: What is their name?     None 10. POTENTIAL TRIGGERS: Do you drink caffeinated beverages (e.g., coffee, colas, teas), and how much daily? Do you drink alcohol or use any drugs? Have you started any new medicines recently?       High stress job, ran out of medication 11. PATIENT SUPPORT: Who is with you now? Who do you live with? Do you have family or friends who you can talk to?        Yes 12. OTHER SYMPTOMS: Do you have any other symptoms? (e.g., feeling depressed, trouble concentrating, trouble sleeping, trouble breathing, palpitations or fast heartbeat, chest pain, sweating, nausea, or diarrhea)       Hot flashes, insomnia, neuropathy worsening  Protocols used: Anxiety and Panic Attack-A-AH

## 2024-06-29 ENCOUNTER — Encounter: Payer: Self-pay | Admitting: Family Medicine

## 2024-07-01 ENCOUNTER — Other Ambulatory Visit: Payer: Self-pay

## 2024-07-01 DIAGNOSIS — F411 Generalized anxiety disorder: Secondary | ICD-10-CM

## 2024-07-01 MED ORDER — SERTRALINE HCL 100 MG PO TABS: 100.0000 mg | ORAL_TABLET | Freq: Every day | ORAL | 0 refills | Status: DC

## 2024-07-02 ENCOUNTER — Ambulatory Visit: Payer: Self-pay | Admitting: Family Medicine

## 2024-07-22 ENCOUNTER — Ambulatory Visit (INDEPENDENT_AMBULATORY_CARE_PROVIDER_SITE_OTHER): Payer: Self-pay | Admitting: Family Medicine

## 2024-07-22 ENCOUNTER — Encounter: Payer: Self-pay | Admitting: Family Medicine

## 2024-07-22 VITALS — BP 111/68 | HR 58 | Ht 62.5 in | Wt 287.2 lb

## 2024-07-22 DIAGNOSIS — G629 Polyneuropathy, unspecified: Secondary | ICD-10-CM

## 2024-07-22 DIAGNOSIS — F411 Generalized anxiety disorder: Secondary | ICD-10-CM | POA: Diagnosis not present

## 2024-07-22 DIAGNOSIS — F331 Major depressive disorder, recurrent, moderate: Secondary | ICD-10-CM

## 2024-07-22 MED ORDER — SERTRALINE HCL 100 MG PO TABS: 100.0000 mg | ORAL_TABLET | Freq: Every day | ORAL | 1 refills | Status: AC

## 2024-07-22 MED ORDER — TOPIRAMATE 100 MG PO TABS: 100.0000 mg | ORAL_TABLET | Freq: Two times a day (BID) | ORAL | 1 refills | Status: AC

## 2024-07-22 NOTE — Assessment & Plan Note (Signed)
 Experienced increased anxiety and mood instability after running out of Zoloft  due to a communication issue with the pharmacy. Symptoms improved upon resuming medication. Zoloft  is effective in managing mood and anxiety symptoms. - Prescribe Zoloft  for a six-month supply with a three-month refill.

## 2024-07-22 NOTE — Progress Notes (Signed)
 Established patient visit   Patient: Sandra Wilkins   DOB: 1966/09/25   57 y.o. Female  MRN: [id number] Visit Date: 07/22/2024  Today's healthcare provider: Jon Eva, MD   Chief Complaint  Patient presents with   Medication Refill   Anxiety    She reports excellent compliance with treatment. She reports good tolerance of treatment. She is not having side effects.  She feels her anxiety is mild and  between unchanged and worse depending on if she has neuropathy flare  since last visit. Symptoms: difficulty concentrating, dizziness, fatigue, insomnia and irritable    Immunizations    Influenza - declined today. Advised of flu clinic   Subjective    HPI HPI     Anxiety    Additional comments: She reports excellent compliance with treatment. She reports good tolerance of treatment. She is not having side effects.  She feels her anxiety is mild and  between unchanged and worse depending on if she has neuropathy flare  since last visit. Symptoms: difficulty concentrating, dizziness, fatigue, insomnia and irritable         Immunizations    Additional comments: Influenza - declined today. Advised of flu clinic      Last edited by Lilian Fitzpatrick, CMA on 07/22/2024  1:24 PM.       Discussed the use of AI scribe software for clinical note transcription with the patient, who gave verbal consent to proceed.  History of Present Illness   Sandra Wilkins is a 58 year old female who presents for medication refills and management of neuropathy symptoms.  She experiences significant discomfort from neuropathy, particularly at night, with itching, burning, and internal tremors during sleep. Topamax  100 mg at night provides partial relief, but symptoms increase with missed doses.  She ran out of Zoloft  due to a pharmacy misunderstanding, leading to increased anxiety and mood disturbances for two weeks. She has resumed Zoloft , which benefits her mood and  anxiety.  Financial difficulties due to her insurance situation limit her access to healthcare services, including routine screenings like mammograms. She is working part-time and awaiting increased work hours for better insurance coverage.  Gastrointestinal issues have improved, and she no longer takes colestipol . She uses omeprazole  as needed for reflux symptoms. She is interested in focusing on weight management and blood sugar control in the future.          07/22/2024    1:18 PM 06/20/2023    1:06 PM 09/20/2022   11:02 AM 10/15/2019    4:18 PM  GAD 7 : Generalized Anxiety Score  Nervous, Anxious, on Edge 1 1 3  0  Control/stop worrying 0 1 3 2   Worry too much - different things 0 1 3 3   Trouble relaxing 1 0 3 2  Restless 1 3 1  0  Easily annoyed or irritable 2 1 1 1   Afraid - awful might happen 2 1 2  0  Total GAD 7 Score 7 8 16 8   Anxiety Difficulty Somewhat difficult Somewhat difficult Somewhat difficult Somewhat difficult        07/22/2024    1:18 PM 06/20/2023    1:06 PM 09/20/2022   10:59 AM 07/26/2022   11:22 AM 06/14/2022   11:18 AM  Depression screen PHQ 2/9  Decreased Interest 2 2 2 2 1   Down, Depressed, Hopeless 2 1 2 1 1   PHQ - 2 Score 4 3 4 3 2   Altered sleeping 3 3 3 2 2   Tired,  decreased energy 2 3 3 2 1   Change in appetite 2 0 1 1 1   Feeling bad or failure about yourself  1 1 1 1 1   Trouble concentrating 1 2 3 2 2   Moving slowly or fidgety/restless 1 1 2  0 1  Suicidal thoughts 0 1 0 0 0  PHQ-9 Score 14 14 17 11 10   Difficult doing work/chores Somewhat difficult Very difficult Extremely dIfficult Extremely dIfficult Very difficult     Medications: Outpatient Medications Prior to Visit  Medication Sig   ALPRAZolam  (XANAX ) 0.5 MG tablet Take 0.5-1 tablets (0.25-0.5 mg total) by mouth 2 (two) times daily as needed for anxiety.   omeprazole  (PRILOSEC) 20 MG capsule TAKE 1 CAPSULE (20 MG) BY MOUTH EVERY DAY   triamcinolone  ointment (KENALOG ) 0.5 % Apply 1  application. topically 2 (two) times daily.   [DISCONTINUED] colestipol  (COLESTID ) 1 g tablet TAKE ONE TABLET (1GRAM) BY MOUTH TWICE DAILY   [DISCONTINUED] sertraline  (ZOLOFT ) 100 MG tablet Take 1 tablet (100 mg total) by mouth at bedtime.   [DISCONTINUED] topiramate  (TOPAMAX ) 100 MG tablet TAKE 1 TABLET BY MOUTH DAILY   No facility-administered medications prior to visit.    Review of Systems     Objective    BP 111/68 (BP Location: Left Arm, Patient Position: Sitting, Cuff Size: Normal)   Pulse (!) 58   Ht 5' 2.5 (1.588 m)   Wt 287 lb 3.2 oz (130.3 kg)   SpO2 96%   BMI 51.69 kg/m    Physical Exam Vitals reviewed.  Constitutional:      General: She is not in acute distress.    Appearance: Normal appearance. She is well-developed. She is not diaphoretic.  HENT:     Head: Normocephalic and atraumatic.  Eyes:     General: No scleral icterus.    Conjunctiva/sclera: Conjunctivae normal.  Neck:     Thyroid : No thyromegaly.  Cardiovascular:     Rate and Rhythm: Normal rate and regular rhythm.     Heart sounds: Normal heart sounds. No murmur heard. Pulmonary:     Effort: Pulmonary effort is normal. No respiratory distress.     Breath sounds: Normal breath sounds. No wheezing, rhonchi or rales.  Musculoskeletal:     Cervical back: Neck supple.     Right lower leg: No edema.     Left lower leg: No edema.  Lymphadenopathy:     Cervical: No cervical adenopathy.  Skin:    General: Skin is warm and dry.     Findings: No rash.  Neurological:     Mental Status: She is alert and oriented to person, place, and time. Mental status is at baseline.  Psychiatric:        Mood and Affect: Mood normal.        Behavior: Behavior normal.      No results found for any visits on 07/22/24.  Assessment & Plan     Problem List Items Addressed This Visit       Nervous and Auditory   Neuropathy   Experiences neuropathy symptoms, including itching, burning, and internal tremors,  primarily at night. Topamax  provides partial relief but is not completely effective. Increasing the frequency of Topamax  dosing may help maintain consistent medication levels and improve symptom control. Discussed that Topamax  is not a 24-hour medication and works for 12-15 hours, hence dosing twice daily may prevent peaks and valleys in medication levels. - Increase Topamax  to 100 mg twice daily to maintain consistent medication levels and  improve neuropathy symptoms.         Other   Anxiety disorder   Experienced increased anxiety and mood instability after running out of Zoloft  due to a communication issue with the pharmacy. Symptoms improved upon resuming medication. Zoloft  is effective in managing mood and anxiety symptoms. - Prescribe Zoloft  for a six-month supply with a three-month refill.      Relevant Medications   sertraline  (ZOLOFT ) 100 MG tablet   Morbid obesity (HCC)   Experiencing challenges with weight management. Topamax  was initially prescribed to aid with appetite suppression, but it is no longer effective for this purpose. Interested in focusing on weight and blood sugar management. Increasing Topamax  to twice daily may aid with appetite suppression and maintain consistent medication levels. - Increase Topamax  to 100 mg twice daily to potentially aid with appetite suppression and maintain consistent medication levels.      Moderate episode of recurrent major depressive disorder (HCC) - Primary   Experienced increased anxiety and mood instability after running out of Zoloft  due to a communication issue with the pharmacy. Symptoms improved upon resuming medication. Zoloft  is effective in managing mood and anxiety symptoms. - Prescribe Zoloft  for a six-month supply with a three-month refill.      Relevant Medications   sertraline  (ZOLOFT ) 100 MG tablet      Return in about 6 months (around 01/19/2025) for CPE.       Jon Eva, MD  [facility] [phone] (phone) [phone] (fax)  [facility]

## 2024-07-22 NOTE — Assessment & Plan Note (Signed)
 Experiences neuropathy symptoms, including itching, burning, and internal tremors, primarily at night. Topamax  provides partial relief but is not completely effective. Increasing the frequency of Topamax  dosing may help maintain consistent medication levels and improve symptom control. Discussed that Topamax  is not a 24-hour medication and works for 12-15 hours, hence dosing twice daily may prevent peaks and valleys in medication levels. - Increase Topamax  to 100 mg twice daily to maintain consistent medication levels and improve neuropathy symptoms.

## 2024-07-22 NOTE — Assessment & Plan Note (Signed)
 Experiencing challenges with weight management. Topamax  was initially prescribed to aid with appetite suppression, but it is no longer effective for this purpose. Interested in focusing on weight and blood sugar management. Increasing Topamax  to twice daily may aid with appetite suppression and maintain consistent medication levels. - Increase Topamax  to 100 mg twice daily to potentially aid with appetite suppression and maintain consistent medication levels.

## 2024-09-12 ENCOUNTER — Other Ambulatory Visit: Payer: Self-pay | Admitting: Medical Genetics

## 2024-09-12 DIAGNOSIS — Z006 Encounter for examination for normal comparison and control in clinical research program: Secondary | ICD-10-CM

## 2024-10-28 ENCOUNTER — Encounter: Payer: Self-pay | Admitting: Family Medicine

## 2024-10-28 NOTE — Telephone Encounter (Signed)
 Ok to get her an Rx for ResMed 11 CPAP machine with heated humidity and all necessary tubing, mask per patient preference and headgear.  Had CPAP titration study in 2014 with setting of 9 cm H2O that I can see in the chart. Can send copy of that (or more recent if you are able to find) with the order. Thanks!

## 2024-11-12 NOTE — Telephone Encounter (Signed)
 Copied from CRM #[id number]. Topic: General - Other >> Nov 12, 2024  2:37 PM Zebedee SAUNDERS wrote: Reason for CRM: [facility] per [facility] ph: [phone] fax: [phone] request for CPAP. They are OON with insurance. Please redirect request.

## 2024-11-12 NOTE — Telephone Encounter (Signed)
 Order sent via parachute Sleep Study results placed in sorter to be faxed to [facility]

## 2025-01-20 ENCOUNTER — Encounter: Admitting: Family Medicine
